# Patient Record
Sex: Female | Born: 1960 | Race: Black or African American | Hispanic: No | Smoking: Never smoker
Health system: Southern US, Community
[De-identification: ages and names within clinical notes are randomized; demographics above are authoritative.]

## PROBLEM LIST (undated history)

## (undated) DIAGNOSIS — K219 Gastro-esophageal reflux disease without esophagitis: Secondary | ICD-10-CM

## (undated) DIAGNOSIS — D509 Iron deficiency anemia, unspecified: Secondary | ICD-10-CM

## (undated) DIAGNOSIS — M069 Rheumatoid arthritis, unspecified: Secondary | ICD-10-CM

## (undated) DIAGNOSIS — K7689 Other specified diseases of liver: Secondary | ICD-10-CM

## (undated) DIAGNOSIS — A419 Sepsis, unspecified organism: Secondary | ICD-10-CM

## (undated) DIAGNOSIS — C801 Malignant (primary) neoplasm, unspecified: Secondary | ICD-10-CM

## (undated) DIAGNOSIS — F32A Depression, unspecified: Secondary | ICD-10-CM

## (undated) DIAGNOSIS — N184 Chronic kidney disease, stage 4 (severe): Secondary | ICD-10-CM

## (undated) HISTORY — PX: TUBAL LIGATION: SHX77

---

## 2008-01-12 DIAGNOSIS — A048 Other specified bacterial intestinal infections: Secondary | ICD-10-CM

## 2008-01-12 HISTORY — DX: Other specified bacterial intestinal infections: A04.8

## 2008-09-08 DIAGNOSIS — K265 Chronic or unspecified duodenal ulcer with perforation: Secondary | ICD-10-CM

## 2008-09-08 HISTORY — DX: Chronic or unspecified duodenal ulcer with perforation: K26.5

## 2008-09-08 HISTORY — PX: PYLOROPLASTY: SHX418

## 2014-07-19 DIAGNOSIS — K8301 Primary sclerosing cholangitis: Secondary | ICD-10-CM

## 2014-07-19 HISTORY — DX: Primary sclerosing cholangitis: K83.01

## 2016-01-12 DIAGNOSIS — E7800 Pure hypercholesterolemia, unspecified: Secondary | ICD-10-CM

## 2016-01-12 HISTORY — DX: Pure hypercholesterolemia, unspecified: E78.00

## 2018-03-06 HISTORY — PX: TOTAL COLECTOMY: SHX852

## 2020-04-29 ENCOUNTER — Other Ambulatory Visit: Payer: Self-pay

## 2020-04-29 ENCOUNTER — Emergency Department: Payer: Medicare Other

## 2020-04-29 ENCOUNTER — Inpatient Hospital Stay
Admission: EM | Admit: 2020-04-29 | Discharge: 2020-05-02 | DRG: 389 | Disposition: A | Discharge status: Home / Self Care | Payer: Medicare Other | Attending: Internal Medicine | Admitting: Internal Medicine

## 2020-04-29 DIAGNOSIS — R194 Change in bowel habit: Secondary | ICD-10-CM | POA: Diagnosis not present

## 2020-04-29 DIAGNOSIS — Z8509 Personal history of malignant neoplasm of other digestive organs: Secondary | ICD-10-CM | POA: Diagnosis not present

## 2020-04-29 DIAGNOSIS — K7460 Unspecified cirrhosis of liver: Secondary | ICD-10-CM | POA: Diagnosis present

## 2020-04-29 DIAGNOSIS — R109 Unspecified abdominal pain: Secondary | ICD-10-CM

## 2020-04-29 DIAGNOSIS — N184 Chronic kidney disease, stage 4 (severe): Secondary | ICD-10-CM

## 2020-04-29 DIAGNOSIS — I10 Essential (primary) hypertension: Secondary | ICD-10-CM | POA: Diagnosis not present

## 2020-04-29 DIAGNOSIS — Z888 Allergy status to other drugs, medicaments and biological substances status: Secondary | ICD-10-CM | POA: Diagnosis not present

## 2020-04-29 DIAGNOSIS — C786 Secondary malignant neoplasm of retroperitoneum and peritoneum: Secondary | ICD-10-CM

## 2020-04-29 DIAGNOSIS — Z9221 Personal history of antineoplastic chemotherapy: Secondary | ICD-10-CM

## 2020-04-29 DIAGNOSIS — K8301 Primary sclerosing cholangitis: Secondary | ICD-10-CM

## 2020-04-29 DIAGNOSIS — Z85038 Personal history of other malignant neoplasm of large intestine: Secondary | ICD-10-CM | POA: Diagnosis not present

## 2020-04-29 DIAGNOSIS — Z8711 Personal history of peptic ulcer disease: Secondary | ICD-10-CM

## 2020-04-29 DIAGNOSIS — E7800 Pure hypercholesterolemia, unspecified: Secondary | ICD-10-CM | POA: Diagnosis present

## 2020-04-29 DIAGNOSIS — K219 Gastro-esophageal reflux disease without esophagitis: Secondary | ICD-10-CM | POA: Diagnosis present

## 2020-04-29 DIAGNOSIS — K565 Intestinal adhesions [bands], unspecified as to partial versus complete obstruction: Principal | ICD-10-CM | POA: Diagnosis present

## 2020-04-29 DIAGNOSIS — R933 Abnormal findings on diagnostic imaging of other parts of digestive tract: Secondary | ICD-10-CM | POA: Diagnosis not present

## 2020-04-29 DIAGNOSIS — N1831 Chronic kidney disease, stage 3a: Secondary | ICD-10-CM | POA: Diagnosis not present

## 2020-04-29 DIAGNOSIS — M069 Rheumatoid arthritis, unspecified: Secondary | ICD-10-CM | POA: Diagnosis present

## 2020-04-29 DIAGNOSIS — I129 Hypertensive chronic kidney disease with stage 1 through stage 4 chronic kidney disease, or unspecified chronic kidney disease: Secondary | ICD-10-CM | POA: Diagnosis present

## 2020-04-29 DIAGNOSIS — R188 Other ascites: Secondary | ICD-10-CM | POA: Diagnosis present

## 2020-04-29 DIAGNOSIS — K56609 Unspecified intestinal obstruction, unspecified as to partial versus complete obstruction: Secondary | ICD-10-CM | POA: Diagnosis not present

## 2020-04-29 DIAGNOSIS — N183 Chronic kidney disease, stage 3 unspecified: Secondary | ICD-10-CM

## 2020-04-29 DIAGNOSIS — R140 Abdominal distension (gaseous): Secondary | ICD-10-CM

## 2020-04-29 DIAGNOSIS — Z932 Ileostomy status: Secondary | ICD-10-CM | POA: Diagnosis not present

## 2020-04-29 DIAGNOSIS — Z886 Allergy status to analgesic agent status: Secondary | ICD-10-CM

## 2020-04-29 DIAGNOSIS — Z9049 Acquired absence of other specified parts of digestive tract: Secondary | ICD-10-CM

## 2020-04-29 DIAGNOSIS — Z880 Allergy status to penicillin: Secondary | ICD-10-CM

## 2020-04-29 DIAGNOSIS — Z91018 Allergy to other foods: Secondary | ICD-10-CM | POA: Diagnosis not present

## 2020-04-29 DIAGNOSIS — K754 Autoimmune hepatitis: Secondary | ICD-10-CM

## 2020-04-29 DIAGNOSIS — R1110 Vomiting, unspecified: Secondary | ICD-10-CM | POA: Diagnosis not present

## 2020-04-29 DIAGNOSIS — R970 Elevated carcinoembryonic antigen [CEA]: Secondary | ICD-10-CM | POA: Diagnosis not present

## 2020-04-29 DIAGNOSIS — Z20822 Contact with and (suspected) exposure to covid-19: Secondary | ICD-10-CM | POA: Diagnosis present

## 2020-04-29 HISTORY — DX: Depression, unspecified: F32.A

## 2020-04-29 HISTORY — DX: Malignant (primary) neoplasm, unspecified: C80.1

## 2020-04-29 HISTORY — DX: Gastro-esophageal reflux disease without esophagitis: K21.9

## 2020-04-29 HISTORY — DX: Chronic kidney disease, stage 4 (severe): N18.4

## 2020-04-29 HISTORY — DX: Iron deficiency anemia, unspecified: D50.9

## 2020-04-29 HISTORY — DX: Other specified diseases of liver: K76.89

## 2020-04-29 HISTORY — DX: Rheumatoid arthritis, unspecified: M06.9

## 2020-04-29 LAB — LIPASE, BLOOD: Lipase: 59 U/L — ABNORMAL HIGH (ref 11–51)

## 2020-04-29 LAB — CBC WITH DIFFERENTIAL/PLATELET
Abs Immature Granulocytes: 0.05 10*3/uL (ref 0.00–0.07)
Basophils Absolute: 0 10*3/uL (ref 0.0–0.1)
Basophils Relative: 0 %
Eosinophils Absolute: 0 10*3/uL (ref 0.0–0.5)
Eosinophils Relative: 0 %
HCT: 36 % (ref 36.0–46.0)
Hemoglobin: 11.6 g/dL — ABNORMAL LOW (ref 12.0–15.0)
Immature Granulocytes: 0 %
Lymphocytes Relative: 5 %
Lymphs Abs: 0.6 10*3/uL — ABNORMAL LOW (ref 0.7–4.0)
MCH: 28.5 pg (ref 26.0–34.0)
MCHC: 32.2 g/dL (ref 30.0–36.0)
MCV: 88.5 fL (ref 80.0–100.0)
Monocytes Absolute: 0.8 10*3/uL (ref 0.1–1.0)
Monocytes Relative: 6 %
Neutro Abs: 11.9 10*3/uL — ABNORMAL HIGH (ref 1.7–7.7)
Neutrophils Relative %: 89 %
Platelets: 162 10*3/uL (ref 150–400)
RBC: 4.07 MIL/uL (ref 3.87–5.11)
RDW: 15.9 % — ABNORMAL HIGH (ref 11.5–15.5)
WBC: 13.4 10*3/uL — ABNORMAL HIGH (ref 4.0–10.5)
nRBC: 0 % (ref 0.0–0.2)

## 2020-04-29 LAB — RESP PANEL BY RT-PCR (FLU A&B, COVID) ARPGX2
Influenza A by PCR: NEGATIVE
Influenza B by PCR: NEGATIVE
SARS Coronavirus 2 by RT PCR: NEGATIVE

## 2020-04-29 LAB — URINALYSIS, COMPLETE (UACMP) WITH MICROSCOPIC
Bilirubin Urine: NEGATIVE
Glucose, UA: NEGATIVE mg/dL
Hgb urine dipstick: NEGATIVE
Ketones, ur: NEGATIVE mg/dL
Leukocytes,Ua: NEGATIVE
Nitrite: NEGATIVE
Protein, ur: NEGATIVE mg/dL
Specific Gravity, Urine: 1.011 (ref 1.005–1.030)
pH: 5 (ref 5.0–8.0)

## 2020-04-29 LAB — COMPREHENSIVE METABOLIC PANEL
ALT: 39 U/L (ref 0–44)
AST: 27 U/L (ref 15–41)
Albumin: 3.6 g/dL (ref 3.5–5.0)
Alkaline Phosphatase: 245 U/L — ABNORMAL HIGH (ref 38–126)
Anion gap: 10 (ref 5–15)
BUN: 34 mg/dL — ABNORMAL HIGH (ref 8–23)
CO2: 16 mmol/L — ABNORMAL LOW (ref 22–32)
Calcium: 9.3 mg/dL (ref 8.9–10.3)
Chloride: 108 mmol/L (ref 98–111)
Creatinine, Ser: 1.74 mg/dL — ABNORMAL HIGH (ref 0.44–1.00)
GFR, Estimated: 33 mL/min — ABNORMAL LOW (ref >60)
Glucose, Bld: 112 mg/dL — ABNORMAL HIGH (ref 70–99)
Potassium: 4.1 mmol/L (ref 3.5–5.1)
Sodium: 134 mmol/L — ABNORMAL LOW (ref 135–145)
Total Bilirubin: 1.2 mg/dL (ref 0.3–1.2)
Total Protein: 6.9 g/dL (ref 6.5–8.1)

## 2020-04-29 MED ORDER — IOHEXOL 9 MG/ML PO SOLN: 500.0000 mL | ORAL | Status: AC

## 2020-04-29 MED ORDER — ONDANSETRON HCL 4 MG/2ML IJ SOLN
4.0000 mg | Freq: Four times a day (QID) | INTRAMUSCULAR | Status: DC | PRN
  Administered 2020-04-29: 4 mg via INTRAVENOUS
  Filled 2020-04-29: qty 2

## 2020-04-29 MED ORDER — PHENOL 1.4 % MT LIQD
1.0000 | OROMUCOSAL | Status: DC | PRN
  Filled 2020-04-29: qty 177

## 2020-04-29 MED ORDER — MENTHOL 3 MG MT LOZG
1.0000 | LOZENGE | OROMUCOSAL | Status: DC | PRN
  Filled 2020-04-29: qty 9

## 2020-04-29 MED ORDER — ENOXAPARIN SODIUM 40 MG/0.4ML ~~LOC~~ SOLN
40.0000 mg | SUBCUTANEOUS | Status: DC
  Filled 2020-04-29: qty 0.4

## 2020-04-29 MED ORDER — MORPHINE SULFATE (PF) 2 MG/ML IV SOLN
2.0000 mg | INTRAVENOUS | Status: DC | PRN
  Administered 2020-04-29 – 2020-04-30 (×6): 2 mg via INTRAVENOUS
  Filled 2020-04-29 (×6): qty 1

## 2020-04-29 MED ORDER — LACTATED RINGERS IV BOLUS
1000.0000 mL | Freq: Once | INTRAVENOUS | Status: AC
  Administered 2020-04-29: 1000 mL via INTRAVENOUS

## 2020-04-29 MED ORDER — HYDRALAZINE HCL 20 MG/ML IJ SOLN
10.0000 mg | Freq: Four times a day (QID) | INTRAMUSCULAR | Status: DC | PRN
  Filled 2020-04-29: qty 1

## 2020-04-29 MED ORDER — ONDANSETRON HCL 4 MG/2ML IJ SOLN
4.0000 mg | Freq: Once | INTRAMUSCULAR | Status: AC
  Administered 2020-04-29: 4 mg via INTRAVENOUS
  Filled 2020-04-29: qty 2

## 2020-04-29 MED ORDER — MORPHINE SULFATE (PF) 4 MG/ML IV SOLN
4.0000 mg | Freq: Once | INTRAVENOUS | Status: AC
Start: 2020-04-29 — End: 2020-04-29
  Administered 2020-04-29: 4 mg via INTRAVENOUS
  Filled 2020-04-29: qty 1

## 2020-04-29 MED ORDER — ONDANSETRON HCL 4 MG PO TABS: 4.0000 mg | ORAL_TABLET | Freq: Four times a day (QID) | ORAL | Status: DC | PRN

## 2020-04-29 MED ORDER — PANTOPRAZOLE SODIUM 40 MG IV SOLR
40.0000 mg | INTRAVENOUS | Status: DC
  Administered 2020-04-29 – 2020-04-30 (×2): 40 mg via INTRAVENOUS
  Filled 2020-04-29 (×2): qty 40

## 2020-04-29 MED ORDER — POTASSIUM CHLORIDE IN NACL 20-0.9 MEQ/L-% IV SOLN
INTRAVENOUS | Status: DC
  Filled 2020-04-29 (×4): qty 1000

## 2020-04-29 NOTE — ED Notes (Signed)
Pt to room 211. Two floor RN's notified pt in room and sitting on bedside commode as requested by pt. Retail banker to remind floor RN pt's NG tube needs to be put back on low suction once pt back in bed. Secretary stated she'll let the RN know.

## 2020-04-29 NOTE — Consult Note (Signed)
Hematology/Oncology Consult note [facility] Telephone:([phone] [phone]  Patient Care Team: Pcp, No as PCP - General   Name of the patient: Tanya Osborne  [id number]  1960/04/14    Reason for consult: History of colon cancer now presenting with bowel obstruction Requesting physician: Dr. Francine Graven  Date of visit: 04/29/2020    History of presenting illness-patient is a 60 year old female with multiple comorbidities including Autoimmune liver disease, primary sclerosing cholangitis, pyoderma gangrenosum, renal insufficiency, duodenal ulcer perforation in 2010 and most recently high risk stage III colon cancer diagnosed in February 2020.  She received 12 cycles of FOLFOX chemotherapy ending in October 2020.  Last seen by [facility] medical oncology Dr. Dayna Ramus tube on 04/09/2020.  At that time she has had a CT scan which showed some concern for possible omental disease.  However it was unclear of those findings were secondary to prior surgery and plan was restaging scans every 6 months with a repeat CEA to be checked in 6 weeks which was trending up.  Patient now presents to [facility] ER with symptoms of abdominal pain nausea and vomiting.  She underwent CT abdomen and pelvis without contrast which showed small bowel obstruction pattern with dilatation of proximal and mid small bowel loops and decompressed distal small bowel with transition point likely in the upper left pelvis.  Omental/peritoneal thickening and nodularity concerning for metastatic disease.  Cirrhosis with small volume abdominal ascites.  Surgery is currently on board with plans for conservative management with IV fluids and n.p.o. and NG tube decompression.  If it does not resolve with conservative measures surgery could be considered but patient would prefer to go to [facility] for this.  ECOG PS- 1  Pain scale- 3   Review of systems- Review of Systems  Constitutional: Positive for malaise/fatigue.  Negative for chills, fever and weight loss.  HENT: Negative for congestion, ear discharge and nosebleeds.   Eyes: Negative for blurred vision.  Respiratory: Negative for cough, hemoptysis, sputum production, shortness of breath and wheezing.   Cardiovascular: Negative for chest pain, palpitations, orthopnea and claudication.  Gastrointestinal: Positive for nausea. Negative for abdominal pain, blood in stool, constipation, diarrhea, heartburn, melena and vomiting.  Genitourinary: Negative for dysuria, flank pain, frequency, hematuria and urgency.  Musculoskeletal: Negative for back pain, joint pain and myalgias.  Skin: Negative for rash.  Neurological: Negative for dizziness, tingling, focal weakness, seizures, weakness and headaches.  Endo/Heme/Allergies: Does not bruise/bleed easily.  Psychiatric/Behavioral: Negative for depression and suicidal ideas. The patient does not have insomnia.     Allergies  Allergen Reactions  . Nsaids Swelling and Rash    Other Reaction: OTHER REACTION-GI BLEED  . Penicillins Swelling  . Celebrex [Celecoxib] Swelling  . Cortisone Swelling  . Gabapentin Swelling  . Methotrexate Derivatives Swelling  . Nabumetone Other (See Comments)  . Tomato Rash    Joint inflamation    Patient Active Problem List   Diagnosis Date Noted  . SBO (small bowel obstruction) ([place]) 04/29/2020  . RA (rheumatoid arthritis) ([place])   . Essential hypertension   . CKD (chronic kidney disease) stage 3, GFR 30-59 ml/min (HCC)   . History of colon cancer      Past Medical History:  Diagnosis Date  . Autoimmune liver disease   . Cancer ([place])   . Depression   . Duodenal ulcer perforation ([place]) 09/08/2008  . GERD (gastroesophageal reflux disease)   . H. pylori infection 2010  . Hypercholesterolemia 2018  . Iron deficiency anemia   .  Kidney disease, chronic, stage IV (GFR 15-29 ml/min) (HCC)   . 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 RA (rheumatoid arthritis) ([place])       Past Surgical History:  Procedure Laterality Date  . PYLOROPLASTY  09/08/2008   with omental patch  . TOTAL COLECTOMY  03/06/2018   total abdominal colectomy with end ileostomy  . TUBAL LIGATION      Social History   Socioeconomic History  . Marital status: Divorced    Spouse name: Not on file  . Number of children: Not on file  . Years of education: Not on file  . Highest education level: Not on file  Occupational History  . Not on file  Tobacco Use  . Smoking status: Not on file  . Smokeless tobacco: Not on file  Substance and Sexual Activity  . Alcohol use: Not on file  . Drug use: Not on file  . Sexual activity: Not on file  Other Topics Concern  . Not on file  Social History Narrative  . Not on file   Social Determinants of Health   Financial Resource Strain: Not on file  Food Insecurity: Not on file  Transportation Needs: Not on file  Physical Activity: Not on file  Stress: Not on file  Social Connections: Not on file  Intimate Partner Violence: Not on file     Family History  Family history unknown: Yes     Current Facility-Administered Medications:  .  0.9 % NaCl with KCl 20 mEq/ L  infusion, , Intravenous, Continuous, Agbata, Tochukwu, MD, Last Rate: 100 mL/hr at 04/29/20 1455, New Bag at 04/29/20 1455 .  enoxaparin (LOVENOX) injection 40 mg, 40 mg, Subcutaneous, Q24H, Agbata, Tochukwu, MD .  hydrALAZINE (APRESOLINE) injection 10 mg, 10 mg, Intravenous, Q6H PRN, Agbata, Tochukwu, MD .  menthol-cetylpyridinium (CEPACOL) lozenge 3 mg, 1 lozenge, Oral, PRN, Edison Simon R, PA-C .  morphine 2 MG/ML injection 2 mg, 2 mg, Intravenous, Q4H PRN, Agbata, Tochukwu, MD, 2 mg at 04/29/20 1554 .  ondansetron (ZOFRAN) tablet 4 mg, 4 mg, Oral, Q6H PRN **OR** ondansetron (ZOFRAN) injection 4 mg, 4 mg, Intravenous, Q6H PRN, Agbata, Tochukwu, MD .  pantoprazole (PROTONIX) injection 40 mg, 40 mg, Intravenous, Q24H, Agbata, Tochukwu, MD, 40 mg at 04/29/20 1452 .   phenol (CHLORASEPTIC) mouth spray 1 spray, 1 spray, Mouth/Throat, PRN, Tylene Fantasia, PA-C  Current Outpatient Medications:  .  Cholecalciferol 50 MCG (2000 UT) CAPS, Take 2,000 Units by mouth daily., Disp: , Rfl:  .  clobetasol ointment (TEMOVATE) 2.13 %, Apply 1 application topically 2 (two) times daily., Disp: , Rfl:  .  ferrous sulfate 325 (65 FE) MG tablet, Take 325 mg by mouth daily with breakfast., Disp: , Rfl:  .  lidocaine-prilocaine (EMLA) cream, Apply 1 application topically once., Disp: , Rfl:  .  Multiple Vitamin (MULTI-VITAMIN) tablet, Take 1 tablet by mouth daily., Disp: , Rfl:  .  pantoprazole (PROTONIX) 20 MG tablet, Take 20 mg by mouth daily., Disp: , Rfl:  .  predniSONE (DELTASONE) 5 MG tablet, Take 5 mg by mouth daily with breakfast., Disp: , Rfl:    Physical exam:  Vitals:   04/29/20 0954 04/29/20 0955 04/29/20 1100 04/29/20 1515  BP: (!) 170/82  (!) 181/100 (!) 157/84  Pulse: 77  77 79  Resp: 17  18 17   Temp: 97.9 F (36.6 C)     TempSrc: Oral     SpO2: 100%  100% 100%  Weight:  172 lb (78 kg)  Height:  5\' 8"  (1.727 m)     Physical Exam Constitutional:      Comments: NG to low wall suction  Cardiovascular:     Rate and Rhythm: Normal rate and regular rhythm.     Heart sounds: Normal heart sounds.  Pulmonary:     Effort: Pulmonary effort is normal.     Breath sounds: Normal breath sounds.  Abdominal:     Palpations: Abdomen is soft.     Comments: Ileostomy in place  Musculoskeletal:     Cervical back: Normal range of motion.  Skin:    General: Skin is warm and dry.  Neurological:     Mental Status: She is alert and oriented to person, place, and time.        CMP Latest Ref Rng & Units 04/29/2020  Glucose 70 - 99 mg/dL 112(H)  BUN 8 - 23 mg/dL 34(H)  Creatinine 0.44 - 1.00 mg/dL 1.74(H)  Sodium 135 - 145 mmol/L 134(L)  Potassium 3.5 - 5.1 mmol/L 4.1  Chloride 98 - 111 mmol/L 108  CO2 22 - 32 mmol/L 16(L)  Calcium 8.9 - 10.3 mg/dL 9.3   Total Protein 6.5 - 8.1 g/dL 6.9  Total Bilirubin 0.3 - 1.2 mg/dL 1.2  Alkaline Phos 38 - 126 U/L 245(H)  AST 15 - 41 U/L 27  ALT 0 - 44 U/L 39   CBC Latest Ref Rng & Units 04/29/2020  WBC 4.0 - 10.5 K/uL 13.4(H)  Hemoglobin 12.0 - 15.0 g/dL 11.6(L)  Hematocrit 36.0 - 46.0 % 36.0  Platelets 150 - 400 K/uL 162    @IMAGES @  CT Abdomen Pelvis Wo Contrast  Result Date: 04/29/2020 CLINICAL DATA:  Elevated white blood cell count. Elevated creatinine. Vomiting. Bowel obstruction suspected. EXAM: CT ABDOMEN AND PELVIS WITHOUT CONTRAST TECHNIQUE: Multidetector CT imaging of the abdomen and pelvis was performed following the standard protocol without IV contrast. COMPARISON:  None FINDINGS: Lower chest: Clear lung bases. Normal heart size without pericardial or pleural effusion. Hepatobiliary: Cirrhosis, as evidenced by caudate lobe enlargement and an irregular hepatic capsule. No dominant liver mass, given limitation of noncontrast exam. Multiple gallstones without acute cholecystitis or biliary duct dilatation. Pancreas: Normal, without mass or ductal dilatation. Spleen: Normal in size, without focal abnormality. Adrenals/Urinary Tract: Normal adrenal glands. No renal calculi or hydronephrosis. No hydroureter or ureteric calculi. No bladder calculi. Stomach/Bowel: Normal stomach, without wall thickening. Hartmann's pouch. Right-sided ostomy.  At least partial colectomy. Small bowel loops are dilated and fluid-filled, including at 3.2 cm on 27/2. Distal to this, solid stool like material within small bowel loops including on 39/2. The distal bowel is decompressed. A focal transition point is not confidently identified. Likely within mid to distal small bowel of the left upper pelvis. No pneumatosis or free intraperitoneal air. Vascular/Lymphatic: Aortic atherosclerosis. No abdominopelvic adenopathy. Reproductive: Retroverted uterus with calcified 2.6 cm fibroid within. No adnexal mass. Other: Mild pelvic  floor laxity. No significant free fluid. Small volume perihepatic ascites. Extensive omental thickening and nodularity, including on 38/2. Musculoskeletal: Degenerative disc disease at the lumbosacral junction. Disc bulges at L3-4 and L4-5. IMPRESSION: 1. Mild degradation secondary to lack of oral or IV contrast. 2. Right-sided ostomy and Hartmann's pouch with at least partial colectomy. 3. Small bowel obstruction pattern with dilatation of proximal and mid small bowel loops and decompressed distal small bowel. Transition likely is within the upper left pelvis, but difficult to delineate due to lack of contrast. 4. Omental/peritoneal thickening and nodularity, highly suspicious for metastatic  disease. Presumably from the patient's colonic primary. 5. Cirrhosis with small volume abdominal ascites. 6. Cholelithiasis. 7.  Aortic Atherosclerosis (ICD10-I70.0). 8. Uterine fibroid. Electronically Signed   By: Abigail Miyamoto M.D.   On: 04/29/2020 12:08    Assessment and plan- Patient is a 60 y.o. female with history of stage III colon cancer in February 2020 s/p 12 cycles of FOLFOX chemotherapy given adjuvantly up until October 2020.  Patient's care has been at [facility].  She has other complex medical history including autoimmune liver disease, primary sclerosing cholangitis, inflammatory arthritis, CKD and abdominal surgeries in the past now presenting with small bowel obstruction  Small bowel obstruction: Could be in the setting of adhesions from prior surgeries versus malignant bowel obstruction.  Surgery is on board and she is being treated with conservative measures including decompression NG tube IV fluids and nausea medications.  If bowel obstruction fails to improve with conservative measures surgery would be a consideration given her complex surgical history and prior history of colonic strictures patient prefers to be transferred to [facility] for this.  CT scan also shows concern for omental metastatic disease which  was already being considered when she was seen by her primary oncologist Dr. Dennison Nancy in March 2022.  I will defer further management to [place] oncology as it would not change her present management which is presently geared towards improving her bowel obstruction.     Visit Diagnosis 1. SBO (small bowel obstruction) ([place])   2. History of colon cancer   3. Small bowel obstruction (HCC)     Dr. Randa Evens, MD, MPH [facility] [phone] 04/29/2020

## 2020-04-29 NOTE — ED Notes (Signed)
Pt presents to ED via POV with c/o of lower ABD pain, pt states "I think I ate some bad food this weekend". Pt states vomiting all day Monday and states she now has a "sore" stomach. Pt denies active nausea or vomiting. NAD noted.

## 2020-04-29 NOTE — Progress Notes (Signed)
   04/29/20 1100  Clinical Encounter Type  Visited With Patient and family together  Visit Type Initial;Spiritual support;Social support  Referral From Chaplain  Consult/Referral To [place] visited with PT and her daughter while doing rounds. PT was able to express her deep faith in God. She stated, no matter what she will be good. She spoke to the chaplain about her various emotions, her faith, and her love for her church. Chaplain ministered with presence, reflective listening, and prayer.

## 2020-04-29 NOTE — Consult Note (Addendum)
Patient seen and evaluated with Mr. Olean Ree and agree with his note.  Patient presents with two day history of worsening/progressing nausea, vomiting, with mid abdominal discomfort/pain.  She has a recent history of colon cancer s/p total colectomy with end ileostomy in 2020 at [facility].  She had a recent MRI which shows some peritoneal/omental enhancing concerning for possible metastatic disease and was scheduled for flex sig at [facility] tomorrow but she canceled the appointment due to her current illness.  Her most recent CEA is elevated to 40.  In the ED, workup showed small bowel obstruction, also with some thickening in the omentum and peritoneum.  NG tube was placed at bedside.  Discussed with patient the role for conservative management with NG tube decompression to allow for the SBO to resolve.  However, if there's no improvement over the next 72 hrs, may require surgical exploration.  Given her prior surgical and current potential metastatic disease, this would be a complex scenario.  She would rather be transferred to [facility] if any surgery is needed.  For now, continue NG tube, NPO, IV fluids, KUB in AM.  Olean Ree, MD   [facility] SURGICAL CONSULTATION NOTE (initial) - cpt: [phone]   HISTORY OF PRESENT ILLNESS (HPI):  60 y.o. female presented to [facility] ED today for evaluation of abdominal pain. Patient reports around a 48 hour history of worsening generalized abdominal pain along with associated distension, nausea, emesis. She does have a history of total colectomy and ileostomy in February 2020 (Dr Maxie Better Rob Hickman) secondary to colon CA, and she has noticed decreased output from her ileostomy. She has not been able to tolerate any PO intake over this time as well. She denied any fever, chills, cough, CP,. SOB, or urinary changes. Additional intraabdominal surgical history is positive for duodenal perforation repair in 2010. She has completed chemotherapy >1 year ago for her colon cancer and is  currently under surveillance. However, she did have an MRI in March of 2022 which was concerning for changes in the peritoneum and omentum concerning for advancement of her diease. Her most recent CEA was 40 on 03/30. She was due for screening flexible sigmoidoscopy tomorrow (04/20), but cancel due to her illness. Work up in the ED was concerning for mild leukocytosis to 13.4K, baseline CKD with sCr - 1.74, mild hyponatremia to 134, lipase and alk phos were also mildly elevated, and CT Abdomen/Pelvis was concerning for small bowel obstruction. She was offered transfer to [facility] but prefers to remain here unless surgical intervention become necessary.   Surgery is consulted by emergency medicine physician Dr. Blake Divine, MD in this context for evaluation and management of SBO.   PAST MEDICAL HISTORY (PMH):  Past Medical History:  Diagnosis Date  . Autoimmune liver disease   . Cancer ([place])   . Depression   . Duodenal ulcer perforation ([place]) 09/08/2008  . GERD (gastroesophageal reflux disease)   . H. pylori infection 2010  . Hypercholesterolemia 2018  . Iron deficiency anemia   . Kidney disease, chronic, stage IV (GFR 15-29 ml/min) (HCC)   . 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 RA (rheumatoid arthritis) ([place])      PAST SURGICAL HISTORY ([place]):  Past Surgical History:  Procedure Laterality Date  . PYLOROPLASTY  09/08/2008   with omental patch  . TOTAL COLECTOMY  03/06/2018   total abdominal colectomy with end ileostomy  . TUBAL LIGATION       MEDICATIONS:  Prior to Admission medications   Not on  File     ALLERGIES:  Allergies  Allergen Reactions  . Nsaids Swelling and Rash    Other Reaction: OTHER REACTION-GI BLEED  . Penicillins Swelling  . Celebrex [Celecoxib] Swelling  . Cortisone Swelling  . Gabapentin Swelling  . Methotrexate Derivatives Swelling  . Nabumetone Other (See Comments)  . Tomato Rash    Joint inflamation     SOCIAL HISTORY:  Social History    Socioeconomic History  . Marital status: Divorced    Spouse name: Not on file  . Number of children: Not on file  . Years of education: Not on file  . Highest education level: Not on file  Occupational History  . Not on file  Tobacco Use  . Smoking status: Not on file  . Smokeless tobacco: Not on file  Substance and Sexual Activity  . Alcohol use: Not on file  . Drug use: Not on file  . Sexual activity: Not on file  Other Topics Concern  . Not on file  Social History Narrative  . Not on file   Social Determinants of Health   Financial Resource Strain: Not on file  Food Insecurity: Not on file  Transportation Needs: Not on file  Physical Activity: Not on file  Stress: Not on file  Social Connections: Not on file  Intimate Partner Violence: Not on file     FAMILY HISTORY:  Family History  Family history unknown: Yes      REVIEW OF SYSTEMS:  Review of Systems  Constitutional: Negative for chills and fever.  HENT: Negative for congestion and sore throat.   Respiratory: Negative for cough and shortness of breath.   Cardiovascular: Negative for chest pain and palpitations.  Gastrointestinal: Positive for abdominal pain, nausea and vomiting.  Genitourinary: Negative for dysuria and urgency.  All other systems reviewed and are negative.   VITAL SIGNS:  Temp:  [97.9 F (36.6 C)] 97.9 F (36.6 C) (04/19 0954) Pulse Rate:  [77-79] 79 (04/19 1515) Resp:  [17-18] 17 (04/19 1515) BP: (157-181)/(82-100) 157/84 (04/19 1515) SpO2:  [100 %] 100 % (04/19 1515) Weight:  [78 kg] 78 kg (04/19 0955)     Height: '5\' 8"'  (172.7 cm) Weight: 78 kg BMI (Calculated): 26.16   INTAKE/OUTPUT:  No intake/output data recorded.  PHYSICAL EXAM:  Physical Exam Vitals and nursing note reviewed. Exam conducted with a chaperone present.  Constitutional:      General: She is not in acute distress.    Appearance: She is well-developed. She is not ill-appearing.     Comments: Patient  sitting up in bed, family at bedside  HENT:     Head: Normocephalic and atraumatic.     Mouth/Throat:     Comments: NGT in place, output appears somewhat feculent  Cardiovascular:     Rate and Rhythm: Normal rate.     Heart sounds: Normal heart sounds. No murmur heard.   Pulmonary:     Effort: Pulmonary effort is normal. No respiratory distress.  Abdominal:     General: The ostomy site is clean. There is distension.     Palpations: Abdomen is soft.     Tenderness: There is no abdominal tenderness. There is no guarding or rebound.     Comments: Abdomen is soft, mildly distended, generalized soreness, ileostomy in the right mid abdomen, patent, minimal stool in bag  Genitourinary:    Comments: Deferred Skin:    General: Skin is warm and dry.     Coloration: Skin is not jaundiced or  pale.  Neurological:     General: No focal deficit present.     Mental Status: She is alert and oriented to person, place, and time.  Psychiatric:        Mood and Affect: Mood normal.        Behavior: Behavior normal.      Labs:  CBC Latest Ref Rng & Units 04/29/2020  WBC 4.0 - 10.5 K/uL 13.4(H)  Hemoglobin 12.0 - 15.0 g/dL 11.6(L)  Hematocrit 36.0 - 46.0 % 36.0  Platelets 150 - 400 K/uL 162   CMP Latest Ref Rng & Units 04/29/2020  Glucose 70 - 99 mg/dL 112(H)  BUN 8 - 23 mg/dL 34(H)  Creatinine 0.44 - 1.00 mg/dL 1.74(H)  Sodium 135 - 145 mmol/L 134(L)  Potassium 3.5 - 5.1 mmol/L 4.1  Chloride 98 - 111 mmol/L 108  CO2 22 - 32 mmol/L 16(L)  Calcium 8.9 - 10.3 mg/dL 9.3  Total Protein 6.5 - 8.1 g/dL 6.9  Total Bilirubin 0.3 - 1.2 mg/dL 1.2  Alkaline Phos 38 - 126 U/L 245(H)  AST 15 - 41 U/L 27  ALT 0 - 44 U/L 39     Imaging studies:   CT Abdomen/Pelvis (04/29/2020) personally reviewed with dilated loops of bowel concerning for obstruction vs ileus, s/p colectomy with ileostomy, and some mesenteric changes seen, and radiologist report reviewed below:  IMPRESSION: 1. Mild degradation  secondary to lack of oral or IV contrast. 2. Right-sided ostomy and Hartmann's pouch with at least partial colectomy. 3. Small bowel obstruction pattern with dilatation of proximal and mid small bowel loops and decompressed distal small bowel. Transition likely is within the upper left pelvis, but difficult to delineate due to lack of contrast. 4. Omental/peritoneal thickening and nodularity, highly suspicious for metastatic disease. Presumably from the patient's colonic primary. 5. Cirrhosis with small volume abdominal ascites. 6. Cholelithiasis. 7.  Aortic Atherosclerosis (ICD10-I70.0). 8. Uterine fibroid.    Assessment/Plan: (ICD-10's: K69.609) 60 y.o. female with abdominal pain, distension, nausea, emesis concerning for possible SBO secondary to post-surgical adhesions; however, there is a high concern that this represent advancement of her disease given recent MRI findings and elevated CEA, complicated by pertinent comorbidities including history of colon CA s/p colectomy, ileostomy, and chemotherapy.   - Appreciate medicine admission  - Agree with plan for NGT decompression; LIS; monitor and record output  - NPO + IVF Resuscitation  - Monitor abdominal examination; on-going bowel function  - Pain control prn; antiemetics prn  - Serial KUBs as needed; may consider gastrografin challenge in next 448 hours  - No emergent surgical intervention. Patient understands that should she fail to resolve with conservative measures, surgery would need to be considered. She would prefer transfer to [place] should this need arise   - Monitor leukocytosis; renal function; electrolytes  - Mobilization as tolerated    - Further management per primary service; we will follow    All of the above findings and recommendations were discussed with the patient and her daughter's at bedside, and all of their questions were answered to their expressed satisfaction.  Thank you for the opportunity to  participate in this patient's care.   -- Olean Ree, PA-C [facility] 04/29/2020, 4:15 PM [phone] M-F: 7am - 4pm

## 2020-04-29 NOTE — ED Provider Notes (Signed)
[facility] Emergency Department Provider Note   ____________________________________________   Event Date/Time   First MD Initiated Contact with Patient 04/29/20 [id number]     (approximate)  I have reviewed the triage vital signs and the nursing notes.   HISTORY  Chief Complaint Abdominal Pain    HPI Tanya Osborne is a 60 y.o. 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  Patient reports she has had 2 days of worsening diffuse pain affecting her abdomen along with mild distention.  This is been associated with persistent nausea and vomiting, patient has had trouble keeping down either liquids or solids.  She does report that her ileostomy output is decreased, she has not noticed any blood in her stool.  She denies any fevers, cough, chest pain, or shortness of breath.  She completed chemotherapy for her colon cancer 1 year ago and follows with oncology as well as surgery at [facility].        Past Medical History:  Diagnosis Date  . Cancer ([place])   . RA (rheumatoid arthritis) [facility])     Patient Active Problem List   Diagnosis Date Noted  . SBO (small bowel obstruction) ([place]) 04/29/2020    Past Surgical History:  Procedure Laterality Date  . COLECTOMY      Prior to Admission medications   Not on File    Allergies Celebrex [celecoxib], Cortisone, Gabapentin, Methotrexate derivatives, Nabumetone, and Nsaids  No family history on file.  Social History    Review of Systems  Constitutional: No fever/chills Eyes: No visual changes. ENT: No sore throat. Cardiovascular: Denies chest pain. Respiratory: Denies shortness of breath. Gastrointestinal: Positive for abdominal pain, nausea, and vomiting.  No diarrhea.  No constipation. Genitourinary: Negative for  dysuria. Musculoskeletal: Negative for back pain. Skin: Negative for rash. Neurological: Negative for headaches, focal weakness or numbness.  ____________________________________________   PHYSICAL EXAM:  VITAL SIGNS: ED Triage Vitals  Enc Vitals Group     BP 04/29/20 0954 (!) 170/82     Pulse Rate 04/29/20 0954 77     Resp 04/29/20 0954 17     Temp 04/29/20 0954 97.9 F (36.6 C)     Temp Source 04/29/20 0954 Oral     SpO2 04/29/20 0954 100 %     Weight 04/29/20 0955 172 lb (78 kg)     Height 04/29/20 0955 5\' 8"  (1.727 m)     Head Circumference --      Peak Flow --      Pain Score 04/29/20 0954 10     Pain Loc --      Pain Edu? --      Excl. in [place]? --     Constitutional: Alert and oriented. Eyes: Conjunctivae are normal. Head: Atraumatic. Nose: No congestion/rhinnorhea. Mouth/Throat: Mucous membranes are moist. Neck: Normal ROM Cardiovascular: Normal rate, regular rhythm. Grossly normal heart sounds. Respiratory: Normal respiratory effort.  No retractions. Lungs CTAB. Gastrointestinal: Soft and diffusely tender to palpation with no rebound or guarding.  Mildly distended.  Ileostomy bag in place with minimal output. Genitourinary: deferred Musculoskeletal: No lower extremity tenderness nor edema. Neurologic:  Normal speech and language. No gross focal neurologic deficits are appreciated. Skin:  Skin is warm, dry and intact. No rash noted. Psychiatric: Mood and affect are normal. Speech and behavior are normal.  ____________________________________________   LABS (all labs  ordered are listed, but only abnormal results are displayed)  Labs Reviewed  CBC WITH DIFFERENTIAL/PLATELET - Abnormal; Notable for the following components:      Result Value   WBC 13.4 (*)    Hemoglobin 11.6 (*)    RDW 15.9 (*)    Neutro Abs 11.9 (*)    Lymphs Abs 0.6 (*)    All other components within normal limits  COMPREHENSIVE METABOLIC PANEL - Abnormal; Notable for the following  components:   Sodium 134 (*)    CO2 16 (*)    Glucose, Bld 112 (*)    BUN 34 (*)    Creatinine, Ser 1.74 (*)    Alkaline Phosphatase 245 (*)    GFR, Estimated 33 (*)    All other components within normal limits  LIPASE, BLOOD - Abnormal; Notable for the following components:   Lipase 59 (*)    All other components within normal limits  URINALYSIS, COMPLETE (UACMP) WITH MICROSCOPIC - Abnormal; Notable for the following components:   Color, Urine YELLOW (*)    APPearance HAZY (*)    Bacteria, UA FEW (*)    All other components within normal limits  RESP PANEL BY RT-PCR (FLU A&B, COVID) ARPGX2  HIV ANTIBODY (ROUTINE TESTING W REFLEX)    PROCEDURES  Procedure(s) performed (including Critical Care):  Procedures   ____________________________________________   INITIAL IMPRESSION / ASSESSMENT AND PLAN / ED COURSE       60 year old female with past medical history of 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  CT scan is concerning for small bowel obstruction with no clear transition point.  Case discussed with Dr. Hampton Abbot of general surgery, who reviewed images and agrees with plan for conservative management.  We will hydrate with IV fluids and place NG tube, pain and nausea are improved following Zofran and morphine.  Lab work is unremarkable, case discussed with hospitalist for admission.  Patient and family are agreeable to be admitted here at [facility] unless patient were to need surgical intervention, at which point she would be want to be transferred to [facility].      ____________________________________________   FINAL CLINICAL IMPRESSION(S) / ED DIAGNOSES  Final diagnoses:  SBO (small bowel obstruction) ([place])  History of colon cancer     ED Discharge Orders    None        Note:  This document was prepared using Dragon voice recognition software and may include unintentional dictation errors.   Blake Divine, MD 04/29/20 1326

## 2020-04-29 NOTE — H&P (Signed)
History and Physical    Tanya Osborne FVC:[id number] DOB: 28-Feb-1960 DOA: 04/29/2020  PCP: Pcp, No   Patient coming from: Home  I have personally briefly reviewed patient's old medical records in [place]  Chief Complaint: Abdominal pain  HPI: Tanya Osborne is a 60 y.o. 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  Pain is mostly in the periumbilical area and was rated 10 x 10 in intensity at its worst associated with nausea and vomiting.  Patient notes that her ileostomy output was decreased.  She initially thought her symptoms was related to food poisoning but due to its persistence she came to the ER for further evaluation. Patient completed her chemotherapy for colon cancer 1 year ago and follows up with oncology at [facility]. She denies having any fever, no chills, no dizziness, no lightheadedness, no urinary symptoms, no headache, no chest shortness of breath, no lower extremity swelling, no diaphoresis, no palpitations. Labs show sodium 134, potassium 4.1, chloride 108, bicarb 16, glucose 112, BUN 34, creatinine 1.74, calcium 9.3, alkaline phosphatase 245, albumin 3.6, lipase 59, AST 27, ALT 39, total protein 6.9, white count 13.4, hemoglobin 11.6, hematocrit 36, MCV 88.9, RDW 15.9 platelet count 162 CT scan of abdomen and pelvis shows Mild degradation secondary to lack of oral or IV contrast. Right-sided ostomy and Hartmann's pouch with at least partial Colectomy. Small bowel obstruction pattern with dilatation of proximal and mid small bowel loops and decompressed distal small bowel. Transition likely is within the upper left pelvis, but difficult to delineate due to lack of contrast. Omental/peritoneal thickening and nodularity, highly suspicious for metastatic disease. Presumably from the  patient's colonic primary. Cirrhosis with small volume abdominal ascites. Cholelithiasis. Aortic Atherosclerosis Uterine fibroid.    ED Course: Patient is a 60 year old female with a history of colon cancer status post total colectomy with ileostomy who presents to the ER for evaluation of abdominal pain associated with nausea and vomiting.  Imaging shows small bowel obstruction.  Patient will be admitted to the hospital for further evaluation and treatment.  Review of Systems: As per HPI otherwise all other systems reviewed and negative.    Past Medical History:  Diagnosis Date  . Cancer ([place])   . RA (rheumatoid arthritis) ([place])     Past Surgical History:  Procedure Laterality Date  . COLECTOMY    . ILEOSTOMY       has no history on file for tobacco use, alcohol use, and drug use.  Allergies  Allergen Reactions  . Nsaids Swelling and Rash    Other Reaction: OTHER REACTION-GI BLEED  . Penicillins Swelling  . Celebrex [Celecoxib] Swelling  . Cortisone Swelling  . Gabapentin Swelling  . Methotrexate Derivatives Swelling  . Nabumetone Other (See Comments)  . Tomato Rash    Joint inflamation    Family History  Family history unknown: Yes      Prior to Admission medications   Not on File    Physical Exam: Vitals:   04/29/20 0954 04/29/20 0955 04/29/20 1100  BP: (!) 170/82  (!) 181/100  Pulse: 77  77  Resp: 17  18  Temp: 97.9 F (36.6 C)    TempSrc: Oral    SpO2: 100%  100%  Weight:  78 kg   Height:  5\' 8"  (1.727 m)      Vitals:   04/29/20  0630 04/29/20 0955 04/29/20 1100  BP: (!) 170/82  (!) 181/100  Pulse: 77  77  Resp: 17  18  Temp: 97.9 F (36.6 C)    TempSrc: Oral    SpO2: 100%  100%  Weight:  78 kg   Height:  5\' 8"  (1.727 m)       Constitutional: Alert and oriented x 3.. Not in any apparent distress.  Appears anxious HEENT:      Head: Normocephalic and atraumatic.         Eyes: PERLA, EOMI, Conjunctivae are normal. Sclera is non-icteric.        Mouth/Throat: Mucous membranes are moist.       Neck: Supple with no signs of meningismus. Cardiovascular: Regular rate and rhythm. No murmurs, gallops, or rubs. 2+ symmetrical distal pulses are present . No JVD. No LE edema Respiratory: Respiratory effort normal .Lungs sounds clear bilaterally. No wheezes, crackles, or rhonchi.  Gastrointestinal: Soft, periumbilical tenderness, and non distended with hypoactive bowel sounds.  Infraumbilical surgical scar Genitourinary: No CVA tenderness. Musculoskeletal: Nontender with normal range of motion in all extremities. No cyanosis, or erythema of extremities. Neurologic:  Face is symmetric. Moving all extremities. No gross focal neurologic deficits  Skin: Skin is warm, dry.  No rash or ulcers. Psychiatric: Very anxious   Labs on Admission: I have personally reviewed following labs and imaging studies  CBC: Recent Labs  Lab 04/29/20 1047  WBC 13.4*  NEUTROABS 11.9*  HGB 11.6*  HCT 36.0  MCV 88.5  PLT 160   Basic Metabolic Panel: Recent Labs  Lab 04/29/20 1047  NA 134*  K 4.1  CL 108  CO2 16*  GLUCOSE 112*  BUN 34*  CREATININE 1.74*  CALCIUM 9.3   GFR: Estimated Creatinine Clearance: 37.3 mL/min (A) (by C-G formula based on SCr of 1.74 mg/dL (H)). Liver Function Tests: Recent Labs  Lab 04/29/20 1047  AST 27  ALT 39  ALKPHOS 245*  BILITOT 1.2  PROT 6.9  ALBUMIN 3.6   Recent Labs  Lab 04/29/20 1047  LIPASE 59*   No results for input(s): AMMONIA in the last 168 hours. Coagulation Profile: No results for input(s): INR, PROTIME in the last 168 hours. Cardiac Enzymes: No results for input(s): CKTOTAL, CKMB, CKMBINDEX, TROPONINI in the last 168 hours. BNP (last 3 results) No results for input(s): PROBNP in the last 8760 hours. HbA1C: No results for input(s): HGBA1C in the last 72 hours. CBG: No results for input(s): GLUCAP in the last 168 hours. Lipid Profile: No results for input(s): CHOL, HDL, LDLCALC,  TRIG, CHOLHDL, LDLDIRECT in the last 72 hours. Thyroid Function Tests: No results for input(s): TSH, T4TOTAL, FREET4, T3FREE, THYROIDAB in the last 72 hours. Anemia Panel: No results for input(s): VITAMINB12, FOLATE, FERRITIN, TIBC, IRON, RETICCTPCT in the last 72 hours. Urine analysis:    Component Value Date/Time   COLORURINE YELLOW (A) 04/29/2020 1218   APPEARANCEUR HAZY (A) 04/29/2020 1218   LABSPEC 1.011 04/29/2020 1218   PHURINE 5.0 04/29/2020 [place] 04/29/2020 [place] 04/29/2020 [place] 04/29/2020 [place] 04/29/2020 [place] NEGATIVE 04/29/2020 1218   NITRITE NEGATIVE 04/29/2020 1218   LEUKOCYTESUR NEGATIVE 04/29/2020 1218    Radiological Exams on Admission: CT Abdomen Pelvis Wo Contrast  Result Date: 04/29/2020 CLINICAL DATA:  Elevated white blood cell count. Elevated creatinine. Vomiting. Bowel obstruction suspected. EXAM: CT ABDOMEN AND PELVIS WITHOUT CONTRAST TECHNIQUE: Multidetector CT imaging  of the abdomen and pelvis was performed following the standard protocol without IV contrast. COMPARISON:  None FINDINGS: Lower chest: Clear lung bases. Normal heart size without pericardial or pleural effusion. Hepatobiliary: Cirrhosis, as evidenced by caudate lobe enlargement and an irregular hepatic capsule. No dominant liver mass, given limitation of noncontrast exam. Multiple gallstones without acute cholecystitis or biliary duct dilatation. Pancreas: Normal, without mass or ductal dilatation. Spleen: Normal in size, without focal abnormality. Adrenals/Urinary Tract: Normal adrenal glands. No renal calculi or hydronephrosis. No hydroureter or ureteric calculi. No bladder calculi. Stomach/Bowel: Normal stomach, without wall thickening. Hartmann's pouch. Right-sided ostomy.  At least partial colectomy. Small bowel loops are dilated and fluid-filled, including at 3.2 cm on 27/2. Distal to this, solid stool like  material within small bowel loops including on 39/2. The distal bowel is decompressed. A focal transition point is not confidently identified. Likely within mid to distal small bowel of the left upper pelvis. No pneumatosis or free intraperitoneal air. Vascular/Lymphatic: Aortic atherosclerosis. No abdominopelvic adenopathy. Reproductive: Retroverted uterus with calcified 2.6 cm fibroid within. No adnexal mass. Other: Mild pelvic floor laxity. No significant free fluid. Small volume perihepatic ascites. Extensive omental thickening and nodularity, including on 38/2. Musculoskeletal: Degenerative disc disease at the lumbosacral junction. Disc bulges at L3-4 and L4-5. IMPRESSION: 1. Mild degradation secondary to lack of oral or IV contrast. 2. Right-sided ostomy and Hartmann's pouch with at least partial colectomy. 3. Small bowel obstruction pattern with dilatation of proximal and mid small bowel loops and decompressed distal small bowel. Transition likely is within the upper left pelvis, but difficult to delineate due to lack of contrast. 4. Omental/peritoneal thickening and nodularity, highly suspicious for metastatic disease. Presumably from the patient's colonic primary. 5. Cirrhosis with small volume abdominal ascites. 6. Cholelithiasis. 7.  Aortic Atherosclerosis (ICD10-I70.0). 8. Uterine fibroid. Electronically Signed   By: Abigail Miyamoto M.D.   On: 04/29/2020 12:08     Assessment/Plan Active Problems:   SBO (small bowel obstruction) (H[facility])   RA (rheumatoid arthritis) (H[facility])   Essential hypertension   CKD (chronic kidney disease) stage 3, GFR 30-59 ml/min ([facility])   History of colon cancer     Small bowel obstruction In a patient with a history of colon cancer status post total colectomy with ileostomy She presents for evaluation of abdominal pain mostly in the periumbilical area associated with nausea and vomiting and imaging showed small bowel obstruction pattern with dilatation of proximal and mid  small bowel loops and decompressed distal small bowel. Transition likely is within the upper left pelvis, but difficult to delineate due to lack of contrast. Keep patient n.p.o. Gastric decompression with NG tube IV fluids, IV PPI and antiemetics We will consult surgery    Hypertension with chronic kidney disease stage III Renal function is stable Place patient on IV hydralazine for systolic blood pressure greater than 130mmHg    History of colon cancer Status post total colectomy with ileostomy Patient completed chemotherapy a year ago She is admitted for small bowel obstruction but imaging shows omental/peritoneal thickening and nodularity, highly suspicious for metastatic disease. Presumably from the patient's colonic primary. We will consult oncology    DVT prophylaxis: Lovenox Code Status: full code Family Communication: Greater than 50% of time was spent discussing patient's condition and plan of care with her and her daughter at the bedside.  All questions and concerns have been addressed.  They verbalized understanding and agree with the plan. Disposition Plan: Back to previous home environment Consults called: Oncology/surgery  Status: At the time of admission, it appears that the appropriate admission status for this patient is inpatient. This is judged to be reasonable and necessary in order to provide the required intensity of service to ensure the patient's safety given the presenting symptoms, physical exam findings and initial radiographic in the context of their comorbid conditions. Patient requires inpatient status due to high intensity of service, high risk for further deterioration and high frequency of surveillance required.    Collier Bullock MD [facility]     04/29/2020, 2:03 PM

## 2020-04-30 ENCOUNTER — Inpatient Hospital Stay: Payer: Medicare Other

## 2020-04-30 DIAGNOSIS — N1831 Chronic kidney disease, stage 3a: Secondary | ICD-10-CM

## 2020-04-30 LAB — CBC
HCT: 37.3 % (ref 36.0–46.0)
Hemoglobin: 12.2 g/dL (ref 12.0–15.0)
MCH: 28.8 pg (ref 26.0–34.0)
MCHC: 32.7 g/dL (ref 30.0–36.0)
MCV: 88.2 fL (ref 80.0–100.0)
Platelets: 173 10*3/uL (ref 150–400)
RBC: 4.23 MIL/uL (ref 3.87–5.11)
RDW: 15.9 % — ABNORMAL HIGH (ref 11.5–15.5)
WBC: 12.7 10*3/uL — ABNORMAL HIGH (ref 4.0–10.5)
nRBC: 0 % (ref 0.0–0.2)

## 2020-04-30 LAB — BASIC METABOLIC PANEL
Anion gap: 11 (ref 5–15)
BUN: 32 mg/dL — ABNORMAL HIGH (ref 8–23)
CO2: 16 mmol/L — ABNORMAL LOW (ref 22–32)
Calcium: 9.1 mg/dL (ref 8.9–10.3)
Chloride: 112 mmol/L — ABNORMAL HIGH (ref 98–111)
Creatinine, Ser: 1.87 mg/dL — ABNORMAL HIGH (ref 0.44–1.00)
GFR, Estimated: 30 mL/min — ABNORMAL LOW (ref >60)
Glucose, Bld: 97 mg/dL (ref 70–99)
Potassium: 4.6 mmol/L (ref 3.5–5.1)
Sodium: 139 mmol/L (ref 135–145)

## 2020-04-30 LAB — HIV ANTIBODY (ROUTINE TESTING W REFLEX): HIV Screen 4th Generation wRfx: NONREACTIVE

## 2020-04-30 MED ORDER — BLISTEX MEDICATED EX OINT
TOPICAL_OINTMENT | CUTANEOUS | Status: DC | PRN
  Filled 2020-04-30 (×2): qty 6.3

## 2020-04-30 MED ORDER — CHLORHEXIDINE GLUCONATE CLOTH 2 % EX PADS
6.0000 | MEDICATED_PAD | Freq: Every day | CUTANEOUS | Status: DC
  Administered 2020-04-30 – 2020-05-02 (×3): 6 via TOPICAL

## 2020-04-30 MED ORDER — LACTATED RINGERS IV SOLN: INTRAVENOUS | Status: DC

## 2020-04-30 NOTE — Progress Notes (Signed)
Patient NG tube otuput for shift was 255ml. Had lower abdominal pain over night, giving morphine 2 mg Q4 hr, gave zofran once and nausea resolved. Resting well right now with decreased pain. Abdominal xray done this morning.

## 2020-04-30 NOTE — Progress Notes (Signed)
[facility] SURGICAL PROGRESS NOTE (cpt [phone])  Hospital Day(s): 1.   Interval History: Patient seen and examined, no acute events or new complaints overnight. Patient reports she is overall feeling better, some lower abdominal soreness but overall improved from presentation. She denies fever, chills, nausea, emesis. Her leukocytosis is improved to 12.7K. Renal function remains at her baseline, sCr - 1.87, UO is unmeasured. No electrolyte derangements appreciated. NGT output recorded at 450 ccs. No ileostomy output.   Review of Systems:  Constitutional: denies fever, chills  HEENT: denies cough or congestion  Respiratory: denies any shortness of breath  Cardiovascular: denies chest pain or palpitations  Gastrointestinal: + abdominal pain (improved), denied N/V, or diarrhea/and bowel function as per interval history Genitourinary: denies burning with urination or urinary frequency  Vital signs in last 24 hours: [min-max] current  Temp:  [97.7 F (36.5 C)-98.3 F (36.8 C)] 97.8 F (36.6 C) (04/20 0803) Pulse Rate:  [77-100] 98 (04/20 0803) Resp:  [16-18] 16 (04/20 0803) BP: (146-181)/(70-100) 146/81 (04/20 0803) SpO2:  [97 %-100 %] 100 % (04/20 0803) Weight:  [78 kg] 78 kg (04/19 0955)     Height: 5\' 8"  (172.7 cm) Weight: 78 kg BMI (Calculated): 26.16   Intake/Output last 2 shifts:  04/19 0701 - 04/20 0700 In: 1268.6 [I.V.:1268.6] Out: 450 [Emesis/NG output:450]   Physical Exam:  Constitutional: alert, cooperative and no distress  HENT: normocephalic without obvious abnormality, NGT in place Eyes: PERRL, EOM's grossly intact and symmetric  Respiratory: breathing non-labored at rest  Cardiovascular: regular rate and sinus rhythm  Gastrointestinal: Soft, mild midline lower abdominal soreness, and non-distended, no rebound/guarding Musculoskeletal: no edema or wounds, motor and sensation grossly intact, NT    Labs:  CBC Latest Ref Rng & Units 04/30/2020 04/29/2020   WBC 4.0 - 10.5 K/uL 12.7(H) 13.4(H)  Hemoglobin 12.0 - 15.0 g/dL 12.2 11.6(L)  Hematocrit 36.0 - 46.0 % 37.3 36.0  Platelets 150 - 400 K/uL 173 162   CMP Latest Ref Rng & Units 04/30/2020 04/29/2020  Glucose 70 - 99 mg/dL 97 112(H)  BUN 8 - 23 mg/dL 32(H) 34(H)  Creatinine 0.44 - 1.00 mg/dL 1.87(H) 1.74(H)  Sodium 135 - 145 mmol/L 139 134(L)  Potassium 3.5 - 5.1 mmol/L 4.6 4.1  Chloride 98 - 111 mmol/L 112(H) 108  CO2 22 - 32 mmol/L 16(L) 16(L)  Calcium 8.9 - 10.3 mg/dL 9.1 9.3  Total Protein 6.5 - 8.1 g/dL - 6.9  Total Bilirubin 0.3 - 1.2 mg/dL - 1.2  Alkaline Phos 38 - 126 U/L - 245(H)  AST 15 - 41 U/L - 27  ALT 0 - 44 U/L - 39    Imaging studies:   KUB (04/30/2020) personally reviewed which shows small central loop of dilated small bowel, NGT in place, and radiologist report reviewed:  IMPRESSION: 1.  NG tube noted over the stomach.  2. Ostomy noted on the right. Single air-filled dilated loop of small bowel noted. Small-bowel distention may have improved from prior study. Reference is made to prior CT report of 04/29/2020.   Assessment/Plan: (ICD-10's: K80.609) 60 y.o. female with slight clinical improvement still awaiting return of bowel function admitted with possible SBO secondary to post-surgical adhesions; however, there is a high concern that this represent advancement of her disease given recent MRI findings and elevated CEA, complicated by pertinent comorbidities including history of colon CA s/p colectomy, ileostomy, and chemotherapy.   - Continue NGT decompression; LIS; monitor and record output             -  NPO + IVF Resuscitation             - Monitor abdominal examination; on-going bowel function             - Pain control prn; antiemetics prn             - Serial KUBs as needed; may consider gastrografin challenge in next 24 hours             - No emergent surgical intervention. Patient understands that should she fail to resolve with conservative measures,  surgery would need to be considered. She would prefer transfer to [place] should this need arise              - Monitor leukocytosis; improved             - Mobilization as tolerated               - Further management per primary service; we will follow     All of the above findings and recommendations were discussed with the patient, patient's family (daughters on phone), and the medical team, and all of patient's and family's questions were answered to their expressed satisfaction.  -- Edison Simon, PA-C [facility] 04/30/2020, 8:04 AM [phone] M-F: 7am - 4pm

## 2020-04-30 NOTE — Plan of Care (Signed)
  Problem: Elimination: Goal: Will not experience complications related to bowel motility Outcome: Not Progressing   Problem: Pain Managment: Goal: General experience of comfort will improve Outcome: Progressing   

## 2020-04-30 NOTE — Progress Notes (Signed)
1        [place] [facility] [place] NAME: Tanya Osborne    MR#:  [id number]  DATE OF BIRTH:  [birth year]  SUBJECTIVE:  CHIEF COMPLAINT:   Chief Complaint  Patient presents with  . Abdominal Pain  She reports improvement in her abdominal symptoms.  No nausea or vomiting.  Hoping she does not have to undergo surgery. REVIEW OF SYSTEMS:  Review of Systems  Constitutional: Negative for diaphoresis, fever, malaise/fatigue and weight loss.  HENT: Negative for ear discharge, ear pain, hearing loss, nosebleeds, sore throat and tinnitus.   Eyes: Negative for blurred vision and pain.  Respiratory: Negative for cough, hemoptysis, shortness of breath and wheezing.   Cardiovascular: Negative for chest pain, palpitations, orthopnea and leg swelling.  Gastrointestinal: Positive for abdominal pain. Negative for blood in stool, constipation, diarrhea, heartburn, nausea and vomiting.  Genitourinary: Negative for dysuria, frequency and urgency.  Musculoskeletal: Negative for back pain and myalgias.  Skin: Negative for itching and rash.  Neurological: Negative for dizziness, tingling, tremors, focal weakness, seizures, weakness and headaches.  Psychiatric/Behavioral: Negative for depression. The patient is not nervous/anxious.    DRUG ALLERGIES:   Allergies  Allergen Reactions  . Nsaids Swelling and Rash    Other Reaction: OTHER REACTION-GI BLEED  . Penicillins Swelling  . Celebrex [Celecoxib] Swelling  . Cortisone Swelling  . Gabapentin Swelling  . Methotrexate Derivatives Swelling  . Nabumetone Other (See Comments)  . Tomato Rash    Joint inflamation   VITALS:  Blood pressure (!) 158/80, pulse (!) 105, temperature 98.2 F (36.8 C), resp. rate 17, height 5\' 8"  (1.727 m), weight 78 kg, SpO2 100 %. PHYSICAL EXAMINATION:  Physical Exam  60 year old female lying in the bed comfortably without any acute distress Eyes pupils equal round reactive to light and  accommodation. NG tube in place Lungs clear to auscultation bilaterally no wheezing rales rhonchi crepitation Cardiovascular S1-S2 normal no murmur or gallop Abdomen soft, mild periumbilical area tenderness, no distention Neuro alert and oriented LABORATORY PANEL:  Female CBC Recent Labs  Lab 04/30/20 0441  WBC 12.7*  HGB 12.2  HCT 37.3  PLT 173   ------------------------------------------------------------------------------------------------------------------ Chemistries  Recent Labs  Lab 04/29/20 1047 04/30/20 0441  NA 134* 139  K 4.1 4.6  CL 108 112*  CO2 16* 16*  GLUCOSE 112* 97  BUN 34* 32*  CREATININE 1.74* 1.87*  CALCIUM 9.3 9.1  AST 27  --   ALT 39  --   ALKPHOS 245*  --   BILITOT 1.2  --    RADIOLOGY:  DG Abd 2 Views  Result Date: 04/30/2020 CLINICAL DATA:  Small-bowel obstruction. EXAM: ABDOMEN - 2 VIEW COMPARISON:  CT 04/29/2020. FINDINGS: NG tube noted with tip over the stomach. Ostomy noted on the right. Single air-filled dilated loop of small bowel noted. Small-bowel distention may have improved from prior study. Reference is made to prior CT report of 04/29/2020. No free air identified. Calcified fibroid again noted. Pelvic calcifications consistent phleboliths. No acute bony abnormality. Mild left base subsegmental atelectasis. Port-A-Cath noted. IMPRESSION: 1.  NG tube noted over the stomach. 2. Ostomy noted on the right. Single air-filled dilated loop of small bowel noted. Small-bowel distention may have improved from prior study. Reference is made to prior CT report of 04/29/2020. 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  N.p.o. for now, continue IV fluid.  NG tube decompression with LIS If she fails conservative management consider transfer to [facility] for complex surgery need  Essential hypertension As needed hydralazine  CKD stage IIIa Stable kidney function  History of stage III colon cancer status post total colectomy and ileostomy Followed by oncology   Body mass index is 26.15 kg/m.  Net IO Since Admission: 818.63 mL [04/30/20 1317]      Status is: Inpatient  Remains inpatient appropriate because:Inpatient level of care appropriate due to severity of illness   Dispo: The patient is from: Home              Anticipated d/c is to: Home              Patient currently is not medically stable to d/c.   Difficult to place patient No       DVT prophylaxis:       enoxaparin (LOVENOX) injection 40 mg Start: 04/29/20 2200     Family Communication: discussed with patient   All the records are reviewed and case discussed with Care Management/Social Worker. Management plans discussed with the patient, nursing and they are in agreement.  CODE STATUS: Full Code Level of care: Med-Surg  TOTAL TIME TAKING CARE OF THIS PATIENT: 35 minutes.   More than 50% of the time was spent in counseling/coordination of care: YES  POSSIBLE D/C IN 2-3 DAYS, DEPENDING ON CLINICAL CONDITION.   Max Sane M.D on 04/30/2020 at 1:17 PM  [facility]   CC: Primary care physician; Pcp, No  Note: This dictation was prepared with Dragon dictation along with smaller phrase technology. Any transcriptional errors that result from this process are unintentional.

## 2020-04-30 NOTE — Plan of Care (Signed)
  Problem: Clinical Measurements: ?Goal: Diagnostic test results will improve ?Outcome: Progressing ?  ?Problem: Nutrition: ?Goal: Adequate nutrition will be maintained ?Outcome: Progressing ?  ?Problem: Elimination: ?Goal: Will not experience complications related to bowel motility ?Outcome: Progressing ?Goal: Will not experience complications related to urinary retention ?Outcome: Progressing ?  ?Problem: Pain Managment: ?Goal: General experience of comfort will improve ?Outcome: Progressing ?  ?

## 2020-05-01 ENCOUNTER — Encounter: Payer: Self-pay | Admitting: Internal Medicine

## 2020-05-01 DIAGNOSIS — M069 Rheumatoid arthritis, unspecified: Secondary | ICD-10-CM

## 2020-05-01 LAB — BASIC METABOLIC PANEL
Anion gap: 8 (ref 5–15)
BUN: 33 mg/dL — ABNORMAL HIGH (ref 8–23)
CO2: 17 mmol/L — ABNORMAL LOW (ref 22–32)
Calcium: 8.7 mg/dL — ABNORMAL LOW (ref 8.9–10.3)
Chloride: 116 mmol/L — ABNORMAL HIGH (ref 98–111)
Creatinine, Ser: 2.03 mg/dL — ABNORMAL HIGH (ref 0.44–1.00)
GFR, Estimated: 27 mL/min — ABNORMAL LOW (ref >60)
Glucose, Bld: 75 mg/dL (ref 70–99)
Potassium: 4 mmol/L (ref 3.5–5.1)
Sodium: 141 mmol/L (ref 135–145)

## 2020-05-01 LAB — CBC
HCT: 31.9 % — ABNORMAL LOW (ref 36.0–46.0)
Hemoglobin: 10.2 g/dL — ABNORMAL LOW (ref 12.0–15.0)
MCH: 29.1 pg (ref 26.0–34.0)
MCHC: 32 g/dL (ref 30.0–36.0)
MCV: 90.9 fL (ref 80.0–100.0)
Platelets: 145 10*3/uL — ABNORMAL LOW (ref 150–400)
RBC: 3.51 MIL/uL — ABNORMAL LOW (ref 3.87–5.11)
RDW: 16.4 % — ABNORMAL HIGH (ref 11.5–15.5)
WBC: 6.8 10*3/uL (ref 4.0–10.5)
nRBC: 0 % (ref 0.0–0.2)

## 2020-05-01 MED ORDER — PREDNISONE 5 MG PO TABS
5.0000 mg | ORAL_TABLET | Freq: Every day | ORAL | Status: DC
  Administered 2020-05-02: 5 mg via ORAL
  Filled 2020-05-01 (×2): qty 1

## 2020-05-01 MED ORDER — VITAMIN D 25 MCG (1000 UNIT) PO TABS
2000.0000 [IU] | ORAL_TABLET | Freq: Every day | ORAL | Status: DC
  Administered 2020-05-02: 2000 [IU] via ORAL
  Filled 2020-05-01: qty 2

## 2020-05-01 MED ORDER — FERROUS SULFATE 325 (65 FE) MG PO TABS
325.0000 mg | ORAL_TABLET | Freq: Every day | ORAL | Status: DC
  Administered 2020-05-02: 325 mg via ORAL
  Filled 2020-05-01: qty 1

## 2020-05-01 MED ORDER — BOOST / RESOURCE BREEZE PO LIQD CUSTOM
1.0000 | Freq: Three times a day (TID) | ORAL | Status: DC
  Administered 2020-05-01 – 2020-05-02 (×4): 1 via ORAL

## 2020-05-01 MED ORDER — HYDROCODONE-ACETAMINOPHEN 7.5-325 MG PO TABS
1.0000 | ORAL_TABLET | Freq: Four times a day (QID) | ORAL | Status: DC | PRN
  Administered 2020-05-01 – 2020-05-02 (×2): 1 via ORAL
  Filled 2020-05-01 (×2): qty 1

## 2020-05-01 MED ORDER — PANTOPRAZOLE SODIUM 40 MG PO TBEC
40.0000 mg | DELAYED_RELEASE_TABLET | Freq: Every day | ORAL | Status: DC
  Administered 2020-05-01 – 2020-05-02 (×2): 40 mg via ORAL
  Filled 2020-05-01 (×2): qty 1

## 2020-05-01 NOTE — Progress Notes (Signed)
1        [place] at [place] NAME: Maryland Stell    MR#:  [id number]  DATE OF BIRTH:  [birth year]  SUBJECTIVE:  CHIEF COMPLAINT:   Chief Complaint  Patient presents with  . Abdominal Pain  She is feeling much better.  Agreeable with clear liquids.  Requesting water.  Appreciative of her care REVIEW OF SYSTEMS:  Review of Systems  Constitutional: Negative for diaphoresis, fever, malaise/fatigue and weight loss.  HENT: Negative for ear discharge, ear pain, hearing loss, nosebleeds, sore throat and tinnitus.   Eyes: Negative for blurred vision and pain.  Respiratory: Negative for cough, hemoptysis, shortness of breath and wheezing.   Cardiovascular: Negative for chest pain, palpitations, orthopnea and leg swelling.  Gastrointestinal: Negative for blood in stool, constipation, diarrhea, heartburn, nausea and vomiting.  Genitourinary: Negative for dysuria, frequency and urgency.  Musculoskeletal: Negative for back pain and myalgias.  Skin: Negative for itching and rash.  Neurological: Negative for dizziness, tingling, tremors, focal weakness, seizures, weakness and headaches.  Psychiatric/Behavioral: Negative for depression. The patient is not nervous/anxious.    DRUG ALLERGIES:   Allergies  Allergen Reactions  . Nsaids Swelling and Rash    Other Reaction: OTHER REACTION-GI BLEED  . Penicillins Swelling  . Celebrex [Celecoxib] Swelling  . Cortisone Swelling  . Gabapentin Swelling  . Methotrexate Derivatives Swelling  . Nabumetone Other (See Comments)  . Tomato Rash    Joint inflamation   VITALS:  Blood pressure (!) 152/74, pulse 82, temperature 98.4 F (36.9 C), temperature source Oral, resp. rate 15, height 5\' 8"  (1.727 m), weight 78 kg, SpO2 100 %. PHYSICAL EXAMINATION:  Physical Exam  60 year old female lying in the bed comfortably without any acute distress Eyes pupils equal round reactive to light and accommodation. NG tube in place Lungs  clear to auscultation bilaterally no wheezing rales rhonchi crepitation Cardiovascular S1-S2 normal no murmur or gallop Abdomen soft, mild periumbilical area tenderness, no distention Neuro alert and oriented LABORATORY PANEL:  Female CBC Recent Labs  Lab 05/01/20 0428  WBC 6.8  HGB 10.2*  HCT 31.9*  PLT 145*   ------------------------------------------------------------------------------------------------------------------ Chemistries  Recent Labs  Lab 04/29/20 1047 04/30/20 0441 05/01/20 0428  NA 134*   < > 141  K 4.1   < > 4.0  CL 108   < > 116*  CO2 16*   < > 17*  GLUCOSE 112*   < > 75  BUN 34*   < > 33*  CREATININE 1.74*   < > 2.03*  CALCIUM 9.3   < > 8.7*  AST 27  --   --   ALT 39  --   --   ALKPHOS 245*  --   --   BILITOT 1.2  --   --    < > = values in this interval not displayed.   RADIOLOGY:  No results found. 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  Clear liquid diet started and tolerating thus far.  Will advance as tolerated per surgery  Essential hypertension As needed hydralazine  CKD stage IIIa Stable kidney function  History of stage III colon cancer status  post total colectomy and ileostomy Followed by oncology   Body mass index is 26.15 kg/m.  Net IO Since Admission: 970.85 mL [05/01/20 1126]      Status is: Inpatient  Remains inpatient appropriate because:Inpatient level of care appropriate due to severity of illness   Dispo: The patient is from: Home              Anticipated d/c is to: Home in 1 to 2 days               Patient currently is not medically stable to d/c.   Difficult to place patient No    DVT prophylaxis:       enoxaparin (LOVENOX) injection 40 mg Start: 04/29/20 2200     Family Communication: Updated patient's both daughter over phone on 4/21   All the records are reviewed and case discussed with Care Management/Social Worker. Management plans discussed with the patient, family and they are in agreement.  CODE STATUS: Full Code Level of care: Med-Surg  TOTAL TIME TAKING CARE OF THIS PATIENT: 35 minutes.   More than 50% of the time was spent in counseling/coordination of care: YES  POSSIBLE D/C IN 1-2 DAYS, DEPENDING ON CLINICAL CONDITION.   Max Sane M.D on 05/01/2020 at 11:26 AM  [facility]   CC: Primary care physician; Pcp, No  Note: This dictation was prepared with Dragon dictation along with smaller phrase technology. Any transcriptional errors that result from this process are unintentional.

## 2020-05-01 NOTE — Progress Notes (Signed)
[facility] SURGICAL PROGRESS NOTE (cpt [phone])  Hospital Day(s): 2.   Interval History: Patient seen and examined, no acute events or new complaints overnight. Patient reports she is feeling better this morning and had resolution in her abdominal pain from yesterday. She denies fever, chills, nausea, emesis. Her leukocytosis has resolved, now 6.8K. Hgb down to 10.2 but this is almost certainly dilutional in nature.Renal function remains near her baseline but is increasing, sCr - 2.03, UO - 100 ccs + unmeasured. No significant electrolyte derangements. No new images this morning. NGT output only recorded at 150 ccs. Her ileostomy regained function yesterday afternoon and has had 1.0L of output since. She is ambulating well  Review of Systems:  Constitutional: denies fever, chills  HEENT: denies cough or congestion  Respiratory: denies any shortness of breath  Cardiovascular: denies chest pain or palpitations  Gastrointestinal: denies abdominal pain, N/V, or diarrhea/and bowel function as per interval history Genitourinary: denies burning with urination or urinary frequency  Vital signs in last 24 hours: [min-max] current  Temp:  [97.5 F (36.4 C)-98.2 F (36.8 C)] 97.5 F (36.4 C) (04/21 0325) Pulse Rate:  [96-109] 100 (04/21 0325) Resp:  [16-20] 20 (04/21 0325) BP: (144-158)/(71-82) 156/71 (04/21 0325) SpO2:  [100 %] 100 % (04/21 0325)     Height: 5\' 8"  (172.7 cm) Weight: 78 kg BMI (Calculated): 26.16   Intake/Output last 2 shifts:  04/20 0701 - 04/21 0700 In: 1042.2 [I.V.:1042.2] Out: 1250 [Urine:100; Emesis/NG output:150; Stool:1000]   Physical Exam:  Constitutional: alert, cooperative and no distress  HENT: normocephalic without obvious abnormality, NGT in place (removed this) Eyes: PERRL, EOM's grossly intact and symmetric  Respiratory: breathing non-labored at rest  Cardiovascular: regular rate and sinus rhythm  Gastrointestinal: Soft, non-tender, and  non-distended, no rebound/guarding. Ileostomy in the left mid abdomen, stool and gas in bag Musculoskeletal: no edema or wounds, motor and sensation grossly intact, NT   Labs:  CBC Latest Ref Rng & Units 05/01/2020 04/30/2020 04/29/2020  WBC 4.0 - 10.5 K/uL 6.8 12.7(H) 13.4(H)  Hemoglobin 12.0 - 15.0 g/dL 10.2(L) 12.2 11.6(L)  Hematocrit 36.0 - 46.0 % 31.9(L) 37.3 36.0  Platelets 150 - 400 K/uL 145(L) 173 162   CMP Latest Ref Rng & Units 05/01/2020 04/30/2020 04/29/2020  Glucose 70 - 99 mg/dL 75 97 112(H)  BUN 8 - 23 mg/dL 33(H) 32(H) 34(H)  Creatinine 0.44 - 1.00 mg/dL 2.03(H) 1.87(H) 1.74(H)  Sodium 135 - 145 mmol/L 141 139 134(L)  Potassium 3.5 - 5.1 mmol/L 4.0 4.6 4.1  Chloride 98 - 111 mmol/L 116(H) 112(H) 108  CO2 22 - 32 mmol/L 17(L) 16(L) 16(L)  Calcium 8.9 - 10.3 mg/dL 8.7(L) 9.1 9.3  Total Protein 6.5 - 8.1 g/dL - - 6.9  Total Bilirubin 0.3 - 1.2 mg/dL - - 1.2  Alkaline Phos 38 - 126 U/L - - 245(H)  AST 15 - 41 U/L - - 27  ALT 0 - 44 U/L - - 39    Imaging studies: No new pertinent imaging studies   Assessment/Plan: (ICD-10's: K60.609) 59 y.o. female with return of bowel function admitted with possible SBO secondary to post-surgical adhesions; however, there is a high concern that this represent advancement of her disease given recent MRI findings and elevated CEA, complicated by pertinent comorbidities includinghistory of colon CA s/p colectomy, ileostomy, and chemotherapy.   - Will remove NGT and initiate CLD  - Wean IVF Resuscitation as diet progresses - Monitor abdominal examination; on-going bowel function - Pain control  prn; antiemetics prn   - No emergent surgical intervention. Patient understands that should she fail to resolve with conservative measures, surgery would need to be considered. She would prefer transfer to [facility] should this need arise - Monitor leukocytosis; now resolved - Mobilization as tolerated   - Further management per primary service; we will follow    All of the above findings and recommendations were discussed with the patient, patient's family (daughter's on the phone), and the medical team, and all of patient's and family's questions were answered to their expressed satisfaction.  -- Edison Simon, PA-C [facility] 05/01/2020, 7:05 AM [phone] M-F: 7am - 4pm

## 2020-05-02 NOTE — Discharge Instructions (Signed)
Bowel Obstruction A bowel obstruction means that something is blocking the small or large bowel. The bowel is also called the intestine. It is the long tube that connects the stomach to the opening of the butt (anus). When something blocks the bowel, food and fluids cannot pass through like normal. This condition needs to be treated. Treatment depends on the cause of the problem and how bad the problem is. What are the causes? Common causes of this condition include:  Scar tissue (adhesions) from past surgery or from high-energy X-rays (radiation).  Recent surgery in the belly. This affects how food moves in the bowel.  Some diseases, such as: ? Irritation of the lining of the digestive tract (Crohn's disease). ? Irritation of small pouches in the bowel (diverticulitis).  Growths or tumors.  A bulging organ (hernia).  Twisting of the bowel (volvulus).  A foreign body.  Slipping of a part of the bowel into another part (intussusception).   What are the signs or symptoms? Symptoms of this condition include:  Pain in the belly.  Feeling sick to your stomach (nauseous).  Throwing up (vomiting).  Bloating in the belly.  Being unable to pass gas.  Trouble pooping (constipation).  Watery poop (diarrhea).  A lot of belching. How is this diagnosed? This condition may be diagnosed based on:  A physical exam.  Medical history.  Imaging tests, such as X-ray or CT scan.  Blood tests.  Urine tests. How is this treated? Treatment for this condition may include:  Fluids and pain medicines that are given through an IV tube. Your doctor may tell you not to eat or drink if you feel sick to your stomach and are throwing up.  Eating a clear liquid diet for a few days.  Putting a small tube (nasogastric tube) into the stomach. This will help with pain, discomfort, and nausea by removing blocked air and fluids from the stomach.  Surgery. This may be needed if other treatments do  not work. Follow these instructions at home: Medicines  Take over-the-counter and prescription medicines only as told by your doctor.  If you were prescribed an antibiotic medicine, take it as told by your doctor. Do not stop taking the antibiotic even if you start to feel better. General instructions  Follow your diet as told by your doctor. You may need to: ? Only drink clear liquids until you start to get better. ? Avoid solid foods.  Return to your normal activities as told by your doctor. Ask your doctor what activities are safe for you.  Do not sit for a long time without moving. Get up to take short walks every 1-2 hours. This is important. Ask for help if you feel weak or unsteady.  Keep all follow-up visits as told by your doctor. This is important. How is this prevented? After having a bowel obstruction, you may be more likely to have another. You can do some things to stop it from happening again.  If you have a long-term (chronic) disease, contact your doctor if you see changes or problems.  Take steps to prevent or treat trouble pooping. Your doctor may ask that you: ? Drink enough fluid to keep your pee (urine) pale yellow. ? Take over-the-counter or prescription medicines. ? Eat foods that are high in fiber. These include beans, whole grains, and fresh fruits and vegetables. ? Limit foods that are high in fat and sugar. These include fried or sweet foods.  Stay active. Ask your doctor which   exercises are safe for you.  Avoid stress.  Eat three small meals and three small snacks each day.  Work with a food expert (dietitian) to make a meal plan that works for you.  Do not use any products that contain nicotine or tobacco, such as cigarettes and e-cigarettes. If you need help quitting, ask your doctor.   Contact a doctor if:  You have a fever.  You have chills. Get help right away if:  You have pain or cramps that get worse.  You throw up blood.  You are  sick to your stomach.  You cannot stop throwing up.  You cannot drink fluids.  You feel mixed up (confused).  You feel very thirsty (dehydrated).  Your belly gets more bloated.  You feel weak or you pass out (faint). Summary  A bowel obstruction means that something is blocking the small or large bowel.  Treatment may include IV fluids and pain medicine. You may also have a clear liquid diet, a small tube in your stomach, or surgery.  Drink clear liquids and avoid solid foods until you get better. This information is not intended to replace advice given to you by your health care provider. Make sure you discuss any questions you have with your health care provider. Document Revised: 05/11/2017 Document Reviewed: 05/11/2017 Elsevier Patient Education  2021 Elsevier Inc.  

## 2020-05-02 NOTE — Progress Notes (Signed)
[facility] SURGICAL PROGRESS NOTE (cpt [phone])  Hospital Day(s): 3.   Interval History: Patient seen and examined, no acute events or new complaints overnight. Patient reports she continues to feel well and remains without abdominal pain. She denies fever, chills, nausea, emesis. No new labs or imaging studies this morning. She was able to get her NGT out yesterday after return of significant ileostomy function. She was started on CLD and subsequently advanced to full liquids in the evening. She continues to have ileostomy function.   Review of Systems:  Constitutional: denies fever, chills  HEENT: denies cough or congestion  Respiratory: denies any shortness of breath  Cardiovascular: denies chest pain or palpitations  Gastrointestinal: denies abdominal pain, N/V, or diarrhea/and bowel function as per interval history Genitourinary: denies burning with urination or urinary frequency  Vital signs in last 24 hours: [min-max] current  Temp:  [97.7 F (36.5 C)-98.9 F (37.2 C)] 97.8 F (36.6 C) (04/22 0445) Pulse Rate:  [82-109] 93 (04/22 0445) Resp:  [15-20] 20 (04/22 0445) BP: (132-166)/(69-79) 142/79 (04/22 0445) SpO2:  [100 %] 100 % (04/22 0445)     Height: 5\' 8"  (172.7 cm) Weight: 78 kg BMI (Calculated): 26.16   Intake/Output last 2 shifts:  04/21 0701 - 04/22 0700 In: 480 [P.O.:480] Out: 500 [Stool:500]   Physical Exam:  Constitutional: alert, cooperative and no distress  HENT: normocephalic without obvious abnormality Eyes: PERRL, EOM's grossly intact and symmetric  Respiratory: breathing non-labored at rest  Cardiovascular: regular rate and sinus rhythm  Gastrointestinal:Soft,non-tender, and non-distended, no rebound/guarding. Ileostomy in the left mid abdomen, stool and gas in bag Musculoskeletal: no edema or wounds, motor and sensation grossly intact, NT    Labs:  CBC Latest Ref Rng & Units 05/01/2020 04/30/2020 04/29/2020  WBC 4.0 - 10.5 K/uL 6.8  12.7(H) 13.4(H)  Hemoglobin 12.0 - 15.0 g/dL 10.2(L) 12.2 11.6(L)  Hematocrit 36.0 - 46.0 % 31.9(L) 37.3 36.0  Platelets 150 - 400 K/uL 145(L) 173 162   CMP Latest Ref Rng & Units 05/01/2020 04/30/2020 04/29/2020  Glucose 70 - 99 mg/dL 75 97 112(H)  BUN 8 - 23 mg/dL 33(H) 32(H) 34(H)  Creatinine 0.44 - 1.00 mg/dL 2.03(H) 1.87(H) 1.74(H)  Sodium 135 - 145 mmol/L 141 139 134(L)  Potassium 3.5 - 5.1 mmol/L 4.0 4.6 4.1  Chloride 98 - 111 mmol/L 116(H) 112(H) 108  CO2 22 - 32 mmol/L 17(L) 16(L) 16(L)  Calcium 8.9 - 10.3 mg/dL 8.7(L) 9.1 9.3  Total Protein 6.5 - 8.1 g/dL - - 6.9  Total Bilirubin 0.3 - 1.2 mg/dL - - 1.2  Alkaline Phos 38 - 126 U/L - - 245(H)  AST 15 - 41 U/L - - 27  ALT 0 - 44 U/L - - 39    Imaging studies: No new pertinent imaging studies   Assessment/Plan: (ICD-10's: K21.609) 60 y.o. female with continued bowel function admitted withpossible SBO secondary to post-surgical adhesions; however, there is a high concern that this represent advancement of her disease given recent MRI findings and elevated CEA, complicated by pertinent comorbidities includinghistory of colon CA s/p colectomy, ileostomy, and chemotherapy.   - Will advance to soft diet  - Discontinue IVF resuscitation  - Monitor abdominal examination; on-going bowel function - Pain control prn; antiemetics prn              - No emergent surgical intervention - Mobilization as tolerated  - Further management per primary service   - Discharge Planning: If she tolerates soft diet today,  okay to discharge from surgical standpoint this afternoon. She can follow up with PCP or surgeon at [facility] for this.   All of the above findings and recommendations were discussed with the patient, and the medical team, and all of patient's questions were answered to her expressed satisfaction.  -- Edison Simon, PA-C [facility] 05/02/2020, 7:15 AM [phone] M-F: 7am -  4pm

## 2020-05-02 NOTE — Progress Notes (Signed)
Pt discharged home with her daughters.  VSS and pt denies any pain or problems.  All discharge instructions provided and pt belongings sent with pt.  Pt was discharged via wheelchair.

## 2020-05-02 NOTE — Care Management Important Message (Signed)
Important Message  Patient Details  Name: Tanya Osborne MRN: [id number] Date of Birth: 1960/07/29   Medicare Important Message Given:  Yes     [name] 05/02/2020, 11:54 AM

## 2020-05-03 NOTE — Discharge Summary (Signed)
[facility] NAME: Tanya Osborne    MR#:  [id number]  DATE OF BIRTH:  [birth year]  DATE OF ADMISSION:  04/29/2020   ADMITTING PHYSICIAN: Collier Bullock, MD  DATE OF DISCHARGE: 05/02/2020  4:08 PM  PRIMARY CARE PHYSICIAN: Annice Needy, MD   ADMISSION DIAGNOSIS:  Small bowel obstruction ([place]) [K56.609] SBO (small bowel obstruction) ([place]) [K56.609] History of colon cancer [Z85.038] DISCHARGE DIAGNOSIS:  Active Problems:   Small bowel obstruction (HCC)   RA (rheumatoid arthritis) (HCC)   Essential hypertension   CKD (chronic kidney disease) stage 3, GFR 30-59 ml/min (HCC)   History of colon cancer  SECONDARY DIAGNOSIS:   Past Medical History:  Diagnosis Date  . Autoimmune liver disease   . Cancer ([place])   . Depression   . Duodenal ulcer perforation ([facility]) 09/08/2008  . GERD (gastroesophageal reflux disease)   . H. pylori infection 2010  . Hypercholesterolemia 2018  . Iron deficiency anemia   . Kidney disease, chronic, stage IV (GFR 15-29 ml/min) (HCC)   . 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 RA (rheumatoid arthritis) 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  Essential hypertension controlled  CKD stage IIIa Stable kidney function  History of stage III colon cancer status post total colectomy and ileostomy outpt oncology f/up    DISCHARGE CONDITIONS:  stable CONSULTS OBTAINED:  Treatment Team:  Olean Ree, MD Sindy Guadeloupe, MD DRUG ALLERGIES:   Allergies  Allergen Reactions  .  Nsaids Swelling and Rash    Other Reaction: OTHER REACTION-GI BLEED  . Penicillins Swelling  . Celebrex [Celecoxib] Swelling  . Cortisone Swelling  . Gabapentin Swelling  . Methotrexate Derivatives Swelling  . Nabumetone Other (See Comments)  . Tomato Rash    Joint inflamation   DISCHARGE MEDICATIONS:   Allergies as of 05/02/2020      Reactions   Nsaids Swelling, Rash   Other Reaction: OTHER REACTION-GI BLEED   Penicillins Swelling   Celebrex [celecoxib] Swelling   Cortisone Swelling   Gabapentin Swelling   Methotrexate Derivatives Swelling   Nabumetone Other (See Comments)   Tomato Rash   Joint inflamation      Medication List    TAKE these medications   Cholecalciferol 50 MCG (2000 UT) Caps Take 2,000 Units by mouth daily.   clobetasol ointment 0.05 % Commonly known as: TEMOVATE Apply 1 application topically 2 (two) times daily.   ferrous sulfate 325 (65 FE) MG tablet Take 325 mg by mouth daily with breakfast.   lidocaine-prilocaine cream Commonly known as: EMLA Apply 1 application topically once.   Multi-Vitamin tablet Take 1 tablet by mouth daily.   pantoprazole 20 MG tablet Commonly known as: PROTONIX Take 20 mg by mouth daily.   predniSONE 5 MG tablet Commonly known as: DELTASONE Take 5 mg by mouth daily with breakfast.      DISCHARGE INSTRUCTIONS:   DIET:  Regular diet DISCHARGE CONDITION:  Good ACTIVITY:  Activity as tolerated OXYGEN:  Home Oxygen: No.  Oxygen Delivery: room air DISCHARGE LOCATION:  home   If you experience worsening of your admission symptoms, develop shortness of breath, life threatening  emergency, suicidal or homicidal thoughts you must seek medical attention immediately by calling 911 or calling your MD immediately  if symptoms less severe.  You Must read complete instructions/literature along with all the possible adverse reactions/side effects for all the Medicines you take and that have been prescribed to you.  Take any new Medicines after you have completely understood and accpet all the possible adverse reactions/side effects.   Please note  You were cared for by a hospitalist during your hospital stay. If you have any questions about your discharge medications or the care you received while you were in the hospital after you are discharged, you can call the unit and asked to speak with the hospitalist on call if the hospitalist that took care of you is not available. Once you are discharged, your primary care physician will handle any further medical issues. Please note that NO REFILLS for any discharge medications will be authorized once you are discharged, as it is imperative that you return to your primary care physician (or establish a relationship with a primary care physician if you do not have one) for your aftercare needs so that they can reassess your need for medications and monitor your lab values.    On the day of Discharge:  VITAL SIGNS:  Blood pressure (!) 158/76, pulse 78, temperature 97.7 F (36.5 C), temperature source Oral, resp. rate 16, height 5\' 8"  (1.727 m), weight 78 kg, SpO2 100 %. PHYSICAL EXAMINATION:  GENERAL:  60 y.o.-year-old patient lying in the bed with no acute distress.  EYES: Pupils equal, round, reactive to light and accommodation. No scleral icterus. Extraocular muscles intact.  HEENT: Head atraumatic, normocephalic. Oropharynx and nasopharynx clear.  NECK:  Supple, no jugular venous distention. No thyroid enlargement, no tenderness.  LUNGS: Normal breath sounds bilaterally, no wheezing, rales,rhonchi or crepitation. No use of accessory muscles of respiration.  CARDIOVASCULAR: S1, S2 normal. No murmurs, rubs, or gallops.  ABDOMEN: Soft, non-tender, non-distended. Bowel sounds present. No organomegaly or mass.  EXTREMITIES: No pedal edema, cyanosis, or clubbing.  NEUROLOGIC: Cranial nerves II through XII are intact. Muscle strength 5/5 in all extremities. Sensation  intact. Gait not checked.  PSYCHIATRIC: The patient is alert and oriented x 3.  SKIN: No obvious rash, lesion, or ulcer.  DATA REVIEW:   CBC Recent Labs  Lab 05/01/20 0428  WBC 6.8  HGB 10.2*  HCT 31.9*  PLT 145*    Chemistries  Recent Labs  Lab 04/29/20 1047 04/30/20 0441 05/01/20 0428  NA 134*   < > 141  K 4.1   < > 4.0  CL 108   < > 116*  CO2 16*   < > 17*  GLUCOSE 112*   < > 75  BUN 34*   < > 33*  CREATININE 1.74*   < > 2.03*  CALCIUM 9.3   < > 8.7*  AST 27  --   --   ALT 39  --   --   ALKPHOS 245*  --   --   BILITOT 1.2  --   --    < > = values in this interval not displayed.     Outpatient follow-up  Follow-up Information    Annice Needy, MD. Go on 05/05/2020.   Specialty: Internal Medicine Why: 10am appointment Contact information: [address] [place] [address] [phone]        Tylene Fantasia, PA-C. Go on 05/15/2020.   Specialty: Physician Assistant Why: 11:15am appointment  Contact information: [address] [place] [address] [phone]               33 Day Unplanned Readmission Risk Score   Flowsheet Row ED to Hosp-Admission (Discharged) from 04/29/2020 in [facility]  30 Day Unplanned Readmission Risk Score (%) 9.74 Filed at 05/02/2020 1600     This score is the patient's risk of an unplanned readmission within 30 days of being discharged (0 -100%). The score is based on dignosis, age, lab data, medications, orders, and past utilization.   Low:  0-14.9   Medium: 15-21.9   High: 22-29.9   Extreme: 30 and above         Management plans discussed with the patient, family and they are in agreement.  CODE STATUS: Prior   TOTAL TIME TAKING CARE OF THIS PATIENT: 45 minutes.    Max Sane M.D on 05/03/2020 at 4:07 PM  [facility]   CC: Primary care physician; Annice Needy, MD   Note: This dictation was prepared with Dragon dictation along  with smaller phrase technology. Any transcriptional errors that result from this process are unintentional.

## 2020-05-15 ENCOUNTER — Inpatient Hospital Stay: Payer: Medicare Other | Admitting: Physician Assistant

## 2020-08-14 ENCOUNTER — Other Ambulatory Visit: Payer: Self-pay

## 2020-08-14 ENCOUNTER — Emergency Department
Admission: EM | Admit: 2020-08-14 | Discharge: 2020-08-15 | Disposition: A | Discharge status: Home / Self Care | Payer: Medicare Other | Attending: Emergency Medicine | Admitting: Emergency Medicine

## 2020-08-14 DIAGNOSIS — I129 Hypertensive chronic kidney disease with stage 1 through stage 4 chronic kidney disease, or unspecified chronic kidney disease: Secondary | ICD-10-CM | POA: Diagnosis not present

## 2020-08-14 DIAGNOSIS — E46 Unspecified protein-calorie malnutrition: Secondary | ICD-10-CM | POA: Insufficient documentation

## 2020-08-14 DIAGNOSIS — R531 Weakness: Secondary | ICD-10-CM

## 2020-08-14 DIAGNOSIS — Z859 Personal history of malignant neoplasm, unspecified: Secondary | ICD-10-CM | POA: Diagnosis not present

## 2020-08-14 DIAGNOSIS — N184 Chronic kidney disease, stage 4 (severe): Secondary | ICD-10-CM | POA: Diagnosis not present

## 2020-08-14 LAB — CBC WITH DIFFERENTIAL/PLATELET
Abs Immature Granulocytes: 0.07 10*3/uL (ref 0.00–0.07)
Basophils Absolute: 0 10*3/uL (ref 0.0–0.1)
Basophils Relative: 0 %
Eosinophils Absolute: 0.1 10*3/uL (ref 0.0–0.5)
Eosinophils Relative: 1 %
HCT: 41.4 % (ref 36.0–46.0)
Hemoglobin: 13.3 g/dL (ref 12.0–15.0)
Immature Granulocytes: 1 %
Lymphocytes Relative: 12 %
Lymphs Abs: 1.6 10*3/uL (ref 0.7–4.0)
MCH: 30 pg (ref 26.0–34.0)
MCHC: 32.1 g/dL (ref 30.0–36.0)
MCV: 93.2 fL (ref 80.0–100.0)
Monocytes Absolute: 0.8 10*3/uL (ref 0.1–1.0)
Monocytes Relative: 6 %
Neutro Abs: 10.8 10*3/uL — ABNORMAL HIGH (ref 1.7–7.7)
Neutrophils Relative %: 80 %
Platelets: 186 10*3/uL (ref 150–400)
RBC: 4.44 MIL/uL (ref 3.87–5.11)
RDW: 14.6 % (ref 11.5–15.5)
WBC: 13.4 10*3/uL — ABNORMAL HIGH (ref 4.0–10.5)
nRBC: 0 % (ref 0.0–0.2)

## 2020-08-14 LAB — COMPREHENSIVE METABOLIC PANEL
ALT: 33 U/L (ref 0–44)
AST: 26 U/L (ref 15–41)
Albumin: 4 g/dL (ref 3.5–5.0)
Alkaline Phosphatase: 238 U/L — ABNORMAL HIGH (ref 38–126)
Anion gap: 7 (ref 5–15)
BUN: 40 mg/dL — ABNORMAL HIGH (ref 8–23)
CO2: 21 mmol/L — ABNORMAL LOW (ref 22–32)
Calcium: 9.4 mg/dL (ref 8.9–10.3)
Chloride: 108 mmol/L (ref 98–111)
Creatinine, Ser: 1.93 mg/dL — ABNORMAL HIGH (ref 0.44–1.00)
GFR, Estimated: 29 mL/min — ABNORMAL LOW (ref >60)
Glucose, Bld: 99 mg/dL (ref 70–99)
Potassium: 4.3 mmol/L (ref 3.5–5.1)
Sodium: 136 mmol/L (ref 135–145)
Total Bilirubin: 1 mg/dL (ref 0.3–1.2)
Total Protein: 7.2 g/dL (ref 6.5–8.1)

## 2020-08-14 LAB — TROPONIN I (HIGH SENSITIVITY): Troponin I (High Sensitivity): 12 ng/L (ref <18)

## 2020-08-14 NOTE — ED Triage Notes (Signed)
Pt states that she has felt weak, like she was going to pass out, has had poor PO intake- states this has been going on for 2 weeks- pt states that her nephrologist started her on a new medication that may have made her feel like this- pt has stage 4 colon cancer

## 2020-08-14 NOTE — ED Provider Notes (Signed)
[facility] Emergency Department Provider Note  ____________________________________________   Event Date/Time   First MD Initiated Contact with Patient 08/14/20 2328     (approximate)  I have reviewed the triage vital signs and the nursing notes.   HISTORY  Chief Complaint Weakness    HPI Tanya Osborne is a 60 y.o. female with past medical history of GERD, hypertension, history of duodenal ulcer, here with decreased appetite and generalized weakness.  The patient states that over the last several weeks, she has had progressive worsening generalized weakness.  She had a stomach biopsy several weeks ago, as well as was recently started on sodium bicarb by her nephrologist.  She believes that between these 2 things, she has had poor taste, decreased appetite, and has had progressive worsening generalized weakness.  She has had some lightheadedness with standing.  She presents today for further evaluation.  She does admit that she stopped taking her Protonix as well, as she had read about or heard about side effects from this.  Denies any current complaints other than generalized weakness and poor appetite.  Denies any urinary symptoms.  No changes in bowel habits.  No other acute complaints.    Past Medical History:  Diagnosis Date   Autoimmune liver disease    Cancer ([place])    Depression    Duodenal ulcer perforation ([place]) 09/08/2008   GERD (gastroesophageal reflux disease)    H. 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 Authorizing Provider  feeding supplement (ENSURE ENLIVE / ENSURE PLUS) LIQD Take 237 mLs by mouth 2 (two) times daily between meals. 08/15/20 09/14/20 Yes Duffy Bruce, MD  Cholecalciferol 50 MCG (2000 UT) CAPS Take 2,000 Units by mouth daily.    [provider]  clobetasol ointment (TEMOVATE) 1.61 % Apply 1 application topically 2 (two) times daily. 07/06/19   [provider]  ferrous sulfate 325 (65 FE) MG tablet Take 325 mg by mouth daily with breakfast.    [provider]  lidocaine-prilocaine (EMLA) cream Apply 1 application topically once. 11/28/19   [provider]  Multiple Vitamin (MULTI-VITAMIN) tablet Take 1 tablet by mouth daily.    [provider]  pantoprazole (PROTONIX) 20 MG tablet Take 20 mg by mouth daily. 04/09/20 04/09/21  [provider]    Allergies Nsaids, Penicillins, Celebrex [celecoxib], Cortisone, Gabapentin, Methotrexate derivatives, Nabumetone, and Tomato  Family History  Family history unknown: Yes    Social History    Review of Systems  Review of Systems  Constitutional:  Positive for appetite change and fatigue. Negative for fever.  HENT:  Negative for congestion and sore throat.   Eyes:  Negative for visual disturbance.  Respiratory:  Negative for cough and shortness of breath.   Cardiovascular:  Negative for chest pain.  Gastrointestinal:  Negative for abdominal pain, diarrhea, nausea and vomiting.  Genitourinary:  Negative for flank pain.  Musculoskeletal:  Negative for back pain and neck pain.  Skin:  Negative for rash and wound.  Neurological:  Positive for weakness.  All other systems reviewed and are negative.    ____________________________________________  PHYSICAL EXAM:      VITAL SIGNS: ED Triage Vitals  Enc Vitals Group     BP 08/14/20 1830 125/70     Pulse Rate 08/14/20 1830 94     Resp 08/14/20 1830 16     Temp 08/14/20 1830 98.1 F (36.7 C)     Temp Source 08/14/20 1830 Oral     SpO2 08/14/20 1830 98 %     Weight 08/14/20 1826 156 lb (70.8 kg)     Height 08/14/20 1826 5\' 7"  (1.702 m)     Head Circumference --      Peak Flow --      Pain Score 08/14/20 1826 0     Pain Loc --      Pain Edu? --      Excl. in [place]? --      Physical Exam Vitals and nursing note reviewed.  Constitutional:      General: She is not in acute distress.    Appearance: She is well-developed.  HENT:     Head: Normocephalic and atraumatic.  Eyes:     Conjunctiva/sclera: Conjunctivae normal.  Cardiovascular:     Rate and Rhythm: Normal rate and regular rhythm.     Heart sounds: Normal heart sounds. No murmur heard.   No friction rub.  Pulmonary:     Effort: Pulmonary effort is normal. No respiratory distress.     Breath sounds: Normal breath sounds. No wheezing or rales.  Abdominal:     General: There is no distension.     Palpations: Abdomen is soft.     Tenderness: There is no abdominal tenderness.  Musculoskeletal:     Cervical back: Neck supple.  Skin:    General: Skin is warm.     Capillary Refill: Capillary refill takes less than 2 seconds.  Neurological:     Mental Status: She is alert and oriented to person, place, and time.     Motor: No abnormal muscle tone.      ____________________________________________   LABS (all labs ordered are listed, but only abnormal results are displayed)  Labs Reviewed  URINALYSIS, COMPLETE (UACMP) WITH MICROSCOPIC - Abnormal; Notable for the following components:      Result Value   Color, Urine YELLOW (*)    APPearance CLOUDY (*)    Hgb urine dipstick MODERATE (*)    Ketones, ur 5 (*)    Protein, ur 30 (*)    Leukocytes,Ua TRACE (*)     Bacteria, UA MANY (*)    All other components within normal limits  CBC WITH DIFFERENTIAL/PLATELET - Abnormal; Notable for the following components:   WBC 13.4 (*)    Neutro Abs 10.8 (*)    All other components within normal limits  COMPREHENSIVE METABOLIC PANEL - Abnormal; Notable for the following components:   CO2 21 (*)    BUN 40 (*)    Creatinine, Ser 1.93 (*)    Alkaline Phosphatase 238 (*)    GFR, Estimated 29 (*)    All other components within normal limits  TROPONIN I (HIGH SENSITIVITY)  TROPONIN  I (HIGH SENSITIVITY)    ____________________________________________  EKG:  ________________________________________  RADIOLOGY All imaging, including plain films, CT scans, and ultrasounds, independently reviewed by me, and interpretations confirmed via formal radiology reads.  ED MD interpretation:     Official radiology report(s): No results found.  ____________________________________________  PROCEDURES   Procedure(s) performed (including Critical Care):  Procedures  ____________________________________________  INITIAL IMPRESSION / MDM / [place] / ED COURSE  As part of my medical decision making, I reviewed the following data within the [place] notes reviewed and incorporated, Old chart reviewed, Notes from prior ED visits, and [place] Controlled Substance Database       *Nyonna Hargrove was evaluated in Emergency Department on 08/15/2020 for the symptoms described in the history of present illness. She was evaluated in the context of the global COVID-19 pandemic, which necessitated consideration that the patient might be at risk for infection with the SARS-CoV-2 virus that causes COVID-19. Institutional protocols and algorithms that pertain to the evaluation of patients at risk for COVID-19 are in a state of rapid change based on information released by regulatory bodies including the CDC and federal and state organizations.  These policies and algorithms were followed during the patient's care in the ED.  Some ED evaluations and interventions may be delayed as a result of limited staffing during the pandemic.*     Medical Decision Making: 60 year old female here with generalized weakness.  I suspect this is multifactorial and primarily related to poor caloric intake in the setting of her cancer, recent stomach biopsy with likely gastritis, as well as dental pain limiting calorie intake.  She has been slowly losing weight.  She does appear mildly dehydrated although she has been trying to drink fluids.  She has been able to drink but not wanted to eat many solid foods.  Here, she has a mild leukocytosis which does not appear significantly unchanged from baseline.  She has not had any fevers chills or infectious symptoms.  CMP shows that her renal function is actually a baseline which is reassuring.  Troponin negative x2 and I do not suspect ACS.  EKG is nonischemic.  CBC otherwise unremarkable.   Discussed the importance of increasing her calorie intake and will encourage her to start using Ensure or boost.  Will also have her restart her Protonix as I suspect this is contributing somewhat to her loss of appetite and intermittent abdominal discomfort.  Also refer her to her PCP has a suspect she could benefit significantly from an appetite stimulant such as Remeron or Marinol.  Otherwise, No apparent emergent pathology.  Will discharge with outpatient follow-up.  Of note, UA shows +wbcs, bacteria but 6-10 squamous cells and mucus. Pt is adamant she has had no urinary sx. No flank pain. No fevers. She is not immunosuppressed currently. Will send cx, hold on tx for asymptomatic bacteriuria at this time. ____________________________________________  FINAL CLINICAL IMPRESSION(S) / ED DIAGNOSES  Final diagnoses:  Weakness  Malnutrition, calorie ([place])     MEDICATIONS GIVEN DURING THIS VISIT:  Medications  pantoprazole  (PROTONIX) injection 40 mg (40 mg Intravenous Given 08/15/20 0120)  ondansetron (ZOFRAN) injection 4 mg (4 mg Intravenous Given 08/15/20 0120)  lactated ringers bolus 500 mL (0 mLs Intravenous Stopped 08/15/20 0152)     ED Discharge Orders          Ordered    feeding supplement (ENSURE ENLIVE / ENSURE PLUS) LIQD  2 times daily between meals  08/15/20 0041             Note:  This document was prepared using Dragon voice recognition software and may include unintentional dictation errors.   Duffy Bruce, MD 08/15/20 [phone]

## 2020-08-15 LAB — URINALYSIS, COMPLETE (UACMP) WITH MICROSCOPIC
Bilirubin Urine: NEGATIVE
Glucose, UA: NEGATIVE mg/dL
Ketones, ur: 5 mg/dL — AB
Nitrite: NEGATIVE
Protein, ur: 30 mg/dL — AB
Specific Gravity, Urine: 1.018 (ref 1.005–1.030)
pH: 5 (ref 5.0–8.0)

## 2020-08-15 LAB — TROPONIN I (HIGH SENSITIVITY): Troponin I (High Sensitivity): 10 ng/L (ref <18)

## 2020-08-15 MED ORDER — ENSURE ENLIVE PO LIQD: 1.0000 | Freq: Two times a day (BID) | ORAL | 0 refills | Status: AC

## 2020-08-15 MED ORDER — PANTOPRAZOLE SODIUM 40 MG IV SOLR
40.0000 mg | Freq: Once | INTRAVENOUS | Status: AC
  Administered 2020-08-15: 40 mg via INTRAVENOUS
  Filled 2020-08-15: qty 40

## 2020-08-15 MED ORDER — LACTATED RINGERS IV BOLUS
500.0000 mL | Freq: Once | INTRAVENOUS | Status: AC
  Administered 2020-08-15: 500 mL via INTRAVENOUS

## 2020-08-15 MED ORDER — ONDANSETRON HCL 4 MG/2ML IJ SOLN
4.0000 mg | Freq: Once | INTRAMUSCULAR | Status: AC
  Administered 2020-08-15: 4 mg via INTRAVENOUS
  Filled 2020-08-15: qty 2

## 2020-08-15 NOTE — Discharge Instructions (Addendum)
START taking the Protonix 20 mg daily in the mornings  START taking an over-the-counter calorie supplements. I would recommend ENSURE/ENSURE COMPLETE. You can also try BOOST. This is safe for your degree of kidney disease and will help with energy.  I'd start with one shake daily, increase to twice a day as needed  Continue drinking the baking soda with water  Smart water or any other pure water is safe and recommended. You are OK to drink alkalinized water, but if you're taking the baking soda as well, this is not necessary.

## 2020-08-17 LAB — URINE CULTURE: Culture: <10000 — AB

## 2020-10-21 ENCOUNTER — Emergency Department: Payer: Medicare Other

## 2020-10-21 ENCOUNTER — Encounter
Admission: EM | Disposition: A | Discharge status: Home Health | Payer: Self-pay | Source: Home / Self Care | Attending: Internal Medicine

## 2020-10-21 ENCOUNTER — Inpatient Hospital Stay
Admission: EM | Admit: 2020-10-21 | Discharge: 2020-10-29 | DRG: 872 | Disposition: A | Discharge status: Home Health | Payer: Medicare Other | Attending: Internal Medicine | Admitting: Internal Medicine

## 2020-10-21 ENCOUNTER — Inpatient Hospital Stay: Payer: Medicare Other | Admitting: Anesthesiology

## 2020-10-21 ENCOUNTER — Other Ambulatory Visit: Payer: Self-pay

## 2020-10-21 DIAGNOSIS — N183 Chronic kidney disease, stage 3 unspecified: Secondary | ICD-10-CM | POA: Diagnosis present

## 2020-10-21 DIAGNOSIS — E7800 Pure hypercholesterolemia, unspecified: Secondary | ICD-10-CM | POA: Diagnosis present

## 2020-10-21 DIAGNOSIS — D849 Immunodeficiency, unspecified: Secondary | ICD-10-CM | POA: Diagnosis present

## 2020-10-21 DIAGNOSIS — K611 Rectal abscess: Secondary | ICD-10-CM

## 2020-10-21 DIAGNOSIS — Z7952 Long term (current) use of systemic steroids: Secondary | ICD-10-CM

## 2020-10-21 DIAGNOSIS — Z8711 Personal history of peptic ulcer disease: Secondary | ICD-10-CM

## 2020-10-21 DIAGNOSIS — I129 Hypertensive chronic kidney disease with stage 1 through stage 4 chronic kidney disease, or unspecified chronic kidney disease: Secondary | ICD-10-CM | POA: Diagnosis present

## 2020-10-21 DIAGNOSIS — K769 Liver disease, unspecified: Secondary | ICD-10-CM | POA: Diagnosis present

## 2020-10-21 DIAGNOSIS — I10 Essential (primary) hypertension: Secondary | ICD-10-CM

## 2020-10-21 DIAGNOSIS — Z932 Ileostomy status: Secondary | ICD-10-CM | POA: Diagnosis not present

## 2020-10-21 DIAGNOSIS — Z85038 Personal history of other malignant neoplasm of large intestine: Secondary | ICD-10-CM | POA: Diagnosis present

## 2020-10-21 DIAGNOSIS — N1832 Chronic kidney disease, stage 3b: Secondary | ICD-10-CM

## 2020-10-21 DIAGNOSIS — B966 Bacteroides fragilis [B. fragilis] as the cause of diseases classified elsewhere: Secondary | ICD-10-CM | POA: Diagnosis present

## 2020-10-21 DIAGNOSIS — K219 Gastro-esophageal reflux disease without esophagitis: Secondary | ICD-10-CM | POA: Diagnosis present

## 2020-10-21 DIAGNOSIS — Z9049 Acquired absence of other specified parts of digestive tract: Secondary | ICD-10-CM

## 2020-10-21 DIAGNOSIS — N184 Chronic kidney disease, stage 4 (severe): Secondary | ICD-10-CM | POA: Diagnosis present

## 2020-10-21 DIAGNOSIS — N1831 Chronic kidney disease, stage 3a: Secondary | ICD-10-CM | POA: Diagnosis present

## 2020-10-21 DIAGNOSIS — Z888 Allergy status to other drugs, medicaments and biological substances status: Secondary | ICD-10-CM

## 2020-10-21 DIAGNOSIS — E162 Hypoglycemia, unspecified: Secondary | ICD-10-CM | POA: Diagnosis not present

## 2020-10-21 DIAGNOSIS — M069 Rheumatoid arthritis, unspecified: Secondary | ICD-10-CM | POA: Diagnosis present

## 2020-10-21 DIAGNOSIS — A419 Sepsis, unspecified organism: Secondary | ICD-10-CM | POA: Diagnosis present

## 2020-10-21 DIAGNOSIS — E872 Acidosis, unspecified: Secondary | ICD-10-CM | POA: Diagnosis present

## 2020-10-21 DIAGNOSIS — Z6825 Body mass index (BMI) 25.0-25.9, adult: Secondary | ICD-10-CM | POA: Diagnosis not present

## 2020-10-21 DIAGNOSIS — K8301 Primary sclerosing cholangitis: Secondary | ICD-10-CM | POA: Diagnosis present

## 2020-10-21 DIAGNOSIS — G629 Polyneuropathy, unspecified: Secondary | ICD-10-CM | POA: Diagnosis present

## 2020-10-21 DIAGNOSIS — D509 Iron deficiency anemia, unspecified: Secondary | ICD-10-CM | POA: Diagnosis present

## 2020-10-21 DIAGNOSIS — Z20822 Contact with and (suspected) exposure to covid-19: Secondary | ICD-10-CM | POA: Diagnosis present

## 2020-10-21 DIAGNOSIS — Z880 Allergy status to penicillin: Secondary | ICD-10-CM

## 2020-10-21 DIAGNOSIS — R6520 Severe sepsis without septic shock: Secondary | ICD-10-CM | POA: Diagnosis present

## 2020-10-21 DIAGNOSIS — E860 Dehydration: Secondary | ICD-10-CM | POA: Diagnosis present

## 2020-10-21 DIAGNOSIS — E440 Moderate protein-calorie malnutrition: Secondary | ICD-10-CM | POA: Diagnosis present

## 2020-10-21 DIAGNOSIS — Z91018 Allergy to other foods: Secondary | ICD-10-CM

## 2020-10-21 DIAGNOSIS — Z8249 Family history of ischemic heart disease and other diseases of the circulatory system: Secondary | ICD-10-CM

## 2020-10-21 DIAGNOSIS — L0231 Cutaneous abscess of buttock: Secondary | ICD-10-CM | POA: Diagnosis present

## 2020-10-21 DIAGNOSIS — C189 Malignant neoplasm of colon, unspecified: Secondary | ICD-10-CM | POA: Diagnosis present

## 2020-10-21 DIAGNOSIS — Z886 Allergy status to analgesic agent status: Secondary | ICD-10-CM

## 2020-10-21 DIAGNOSIS — Z79899 Other long term (current) drug therapy: Secondary | ICD-10-CM

## 2020-10-21 DIAGNOSIS — R54 Age-related physical debility: Secondary | ICD-10-CM | POA: Diagnosis present

## 2020-10-21 DIAGNOSIS — R531 Weakness: Secondary | ICD-10-CM

## 2020-10-21 DIAGNOSIS — R5381 Other malaise: Secondary | ICD-10-CM

## 2020-10-21 HISTORY — PX: INCISION AND DRAINAGE ABSCESS: SHX5864

## 2020-10-21 LAB — GLUCOSE, CAPILLARY
Glucose-Capillary: 59 mg/dL — ABNORMAL LOW (ref 70–99)
Glucose-Capillary: 93 mg/dL (ref 70–99)

## 2020-10-21 LAB — URINALYSIS, COMPLETE (UACMP) WITH MICROSCOPIC
Bilirubin Urine: NEGATIVE
Glucose, UA: NEGATIVE mg/dL
Ketones, ur: NEGATIVE mg/dL
Leukocytes,Ua: NEGATIVE
Nitrite: NEGATIVE
Protein, ur: 30 mg/dL — AB
Specific Gravity, Urine: 1.013 (ref 1.005–1.030)
pH: 5 (ref 5.0–8.0)

## 2020-10-21 LAB — BASIC METABOLIC PANEL
Anion gap: 8 (ref 5–15)
Anion gap: 11 (ref 5–15)
BUN: 17 mg/dL (ref 8–23)
BUN: 17 mg/dL (ref 8–23)
CO2: 21 mmol/L — ABNORMAL LOW (ref 22–32)
CO2: 17 mmol/L — ABNORMAL LOW (ref 22–32)
Calcium: 8.7 mg/dL — ABNORMAL LOW (ref 8.9–10.3)
Calcium: 8.1 mg/dL — ABNORMAL LOW (ref 8.9–10.3)
Chloride: 112 mmol/L — ABNORMAL HIGH (ref 98–111)
Chloride: 109 mmol/L (ref 98–111)
Creatinine, Ser: 1.6 mg/dL — ABNORMAL HIGH (ref 0.44–1.00)
Creatinine, Ser: 1.5 mg/dL — ABNORMAL HIGH (ref 0.44–1.00)
GFR, Estimated: 36 mL/min — ABNORMAL LOW (ref >60)
GFR, Estimated: 39 mL/min — ABNORMAL LOW (ref >60)
Glucose, Bld: 90 mg/dL (ref 70–99)
Glucose, Bld: 130 mg/dL — ABNORMAL HIGH (ref 70–99)
Potassium: 3.9 mmol/L (ref 3.5–5.1)
Potassium: 3.8 mmol/L (ref 3.5–5.1)
Sodium: 141 mmol/L (ref 135–145)
Sodium: 137 mmol/L (ref 135–145)

## 2020-10-21 LAB — RESP PANEL BY RT-PCR (FLU A&B, COVID) ARPGX2
Influenza A by PCR: NEGATIVE
Influenza B by PCR: NEGATIVE
SARS Coronavirus 2 by RT PCR: NEGATIVE

## 2020-10-21 LAB — CBC
HCT: 31.9 % — ABNORMAL LOW (ref 36.0–46.0)
HCT: 30 % — ABNORMAL LOW (ref 36.0–46.0)
Hemoglobin: 10.9 g/dL — ABNORMAL LOW (ref 12.0–15.0)
Hemoglobin: 10 g/dL — ABNORMAL LOW (ref 12.0–15.0)
MCH: 32.2 pg (ref 26.0–34.0)
MCH: 31.5 pg (ref 26.0–34.0)
MCHC: 34.2 g/dL (ref 30.0–36.0)
MCHC: 33.3 g/dL (ref 30.0–36.0)
MCV: 94.1 fL (ref 80.0–100.0)
MCV: 94.6 fL (ref 80.0–100.0)
Platelets: 167 10*3/uL (ref 150–400)
Platelets: 147 10*3/uL — ABNORMAL LOW (ref 150–400)
RBC: 3.39 MIL/uL — ABNORMAL LOW (ref 3.87–5.11)
RBC: 3.17 MIL/uL — ABNORMAL LOW (ref 3.87–5.11)
RDW: 17.8 % — ABNORMAL HIGH (ref 11.5–15.5)
RDW: 17.8 % — ABNORMAL HIGH (ref 11.5–15.5)
WBC: 31.5 10*3/uL — ABNORMAL HIGH (ref 4.0–10.5)
WBC: 26.6 10*3/uL — ABNORMAL HIGH (ref 4.0–10.5)
nRBC: 0 % (ref 0.0–0.2)
nRBC: 0 % (ref 0.0–0.2)

## 2020-10-21 LAB — LACTIC ACID, PLASMA
Lactic Acid, Venous: 1.2 mmol/L (ref 0.5–1.9)
Lactic Acid, Venous: 3.7 mmol/L (ref 0.5–1.9)
Lactic Acid, Venous: 5.4 mmol/L (ref 0.5–1.9)

## 2020-10-21 LAB — MRSA NEXT GEN BY PCR, NASAL: MRSA by PCR Next Gen: NOT DETECTED

## 2020-10-21 LAB — MAGNESIUM: Magnesium: 1.4 mg/dL — ABNORMAL LOW (ref 1.7–2.4)

## 2020-10-21 LAB — PROTIME-INR
INR: 1.2 (ref 0.8–1.2)
Prothrombin Time: 15.2 seconds (ref 11.4–15.2)

## 2020-10-21 LAB — SEDIMENTATION RATE: Sed Rate: 69 mm/hr — ABNORMAL HIGH (ref 0–30)

## 2020-10-21 LAB — BRAIN NATRIURETIC PEPTIDE: B Natriuretic Peptide: 255.7 pg/mL — ABNORMAL HIGH (ref 0.0–100.0)

## 2020-10-21 LAB — PROCALCITONIN: Procalcitonin: 3.1 ng/mL

## 2020-10-21 SURGERY — INCISION AND DRAINAGE, ABSCESS
Anesthesia: General | Laterality: Left

## 2020-10-21 MED ORDER — FENTANYL CITRATE (PF) 100 MCG/2ML IJ SOLN
INTRAMUSCULAR | Status: AC
  Filled 2020-10-21: qty 2

## 2020-10-21 MED ORDER — BUPIVACAINE-EPINEPHRINE (PF) 0.25% -1:200000 IJ SOLN
INTRAMUSCULAR | Status: AC
  Filled 2020-10-21: qty 30

## 2020-10-21 MED ORDER — LACTATED RINGERS IV BOLUS: 500.0000 mL | Freq: Once | INTRAVENOUS | Status: DC

## 2020-10-21 MED ORDER — PROPOFOL 10 MG/ML IV BOLUS
INTRAVENOUS | Status: AC
  Filled 2020-10-21: qty 20

## 2020-10-21 MED ORDER — FENTANYL CITRATE (PF) 100 MCG/2ML IJ SOLN
INTRAMUSCULAR | Status: DC | PRN
  Administered 2020-10-21 (×2): 50 ug via INTRAVENOUS

## 2020-10-21 MED ORDER — DEXTROSE 50 % IV SOLN
12.5000 g | INTRAVENOUS | Status: AC
  Administered 2020-10-21: 12.5 g via INTRAVENOUS

## 2020-10-21 MED ORDER — SODIUM CHLORIDE 0.9 % IV SOLN
1.0000 g | INTRAVENOUS | Status: DC
  Administered 2020-10-22 – 2020-10-26 (×5): 1 g via INTRAVENOUS
  Filled 2020-10-21: qty 1
  Filled 2020-10-21: qty 10
  Filled 2020-10-21 (×2): qty 1
  Filled 2020-10-21 (×2): qty 10

## 2020-10-21 MED ORDER — LIDOCAINE HCL (CARDIAC) PF 100 MG/5ML IV SOSY
PREFILLED_SYRINGE | INTRAVENOUS | Status: DC | PRN
  Administered 2020-10-21: 80 mg via INTRAVENOUS

## 2020-10-21 MED ORDER — HYDROMORPHONE HCL 1 MG/ML IJ SOLN
1.0000 mg | Freq: Once | INTRAMUSCULAR | Status: AC
  Administered 2020-10-21: 1 mg via INTRAVENOUS
  Filled 2020-10-21: qty 1

## 2020-10-21 MED ORDER — OXYCODONE HCL 5 MG/5ML PO SOLN
5.0000 mg | Freq: Once | ORAL | Status: DC | PRN
Start: 2020-10-21 — End: 2020-10-21

## 2020-10-21 MED ORDER — OXYCODONE-ACETAMINOPHEN 5-325 MG PO TABS
1.0000 | ORAL_TABLET | ORAL | Status: DC | PRN
  Administered 2020-10-22 – 2020-10-23 (×6): 1 via ORAL
  Filled 2020-10-21 (×6): qty 1

## 2020-10-21 MED ORDER — PREDNISONE 20 MG PO TABS
50.0000 mg | ORAL_TABLET | Freq: Every day | ORAL | Status: DC
  Filled 2020-10-21: qty 1

## 2020-10-21 MED ORDER — LACTATED RINGERS IV BOLUS
1000.0000 mL | Freq: Once | INTRAVENOUS | Status: AC
  Administered 2020-10-21: 1000 mL via INTRAVENOUS

## 2020-10-21 MED ORDER — PANTOPRAZOLE SODIUM 20 MG PO TBEC
20.0000 mg | DELAYED_RELEASE_TABLET | Freq: Every day | ORAL | Status: DC
  Administered 2020-10-22: 20 mg via ORAL
  Filled 2020-10-21 (×2): qty 1

## 2020-10-21 MED ORDER — CALCIUM GLUCONATE-NACL 1-0.675 GM/50ML-% IV SOLN
1.0000 g | Freq: Once | INTRAVENOUS | Status: AC
  Administered 2020-10-21: 1000 mg via INTRAVENOUS
  Filled 2020-10-21: qty 50

## 2020-10-21 MED ORDER — HYDRALAZINE HCL 20 MG/ML IJ SOLN
5.0000 mg | INTRAMUSCULAR | Status: DC | PRN
  Filled 2020-10-21: qty 0.25

## 2020-10-21 MED ORDER — VANCOMYCIN HCL 750 MG/150ML IV SOLN
750.0000 mg | INTRAVENOUS | Status: DC
  Filled 2020-10-21: qty 150

## 2020-10-21 MED ORDER — VANCOMYCIN HCL 1500 MG/300ML IV SOLN
1500.0000 mg | Freq: Once | INTRAVENOUS | Status: AC
  Administered 2020-10-21: 1500 mg via INTRAVENOUS
  Filled 2020-10-21: qty 300

## 2020-10-21 MED ORDER — CHLORHEXIDINE GLUCONATE CLOTH 2 % EX PADS
6.0000 | MEDICATED_PAD | Freq: Every day | CUTANEOUS | Status: DC
  Administered 2020-10-21 – 2020-10-29 (×7): 6 via TOPICAL

## 2020-10-21 MED ORDER — ONDANSETRON HCL 4 MG/2ML IJ SOLN
4.0000 mg | Freq: Three times a day (TID) | INTRAMUSCULAR | Status: DC | PRN
  Administered 2020-10-22: 4 mg via INTRAVENOUS
  Filled 2020-10-21: qty 2

## 2020-10-21 MED ORDER — ONDANSETRON HCL 4 MG/2ML IJ SOLN
INTRAMUSCULAR | Status: DC | PRN
  Administered 2020-10-21: 4 mg via INTRAVENOUS

## 2020-10-21 MED ORDER — LACTATED RINGERS IV SOLN: INTRAVENOUS | Status: DC

## 2020-10-21 MED ORDER — METRONIDAZOLE 500 MG/100ML IV SOLN
500.0000 mg | Freq: Two times a day (BID) | INTRAVENOUS | Status: DC
  Administered 2020-10-22 – 2020-10-26 (×9): 500 mg via INTRAVENOUS
  Filled 2020-10-21 (×10): qty 100

## 2020-10-21 MED ORDER — DEXTROSE 50 % IV SOLN
INTRAVENOUS | Status: AC
  Administered 2020-10-22: 50 mL
  Filled 2020-10-21: qty 50

## 2020-10-21 MED ORDER — SUCCINYLCHOLINE CHLORIDE 200 MG/10ML IV SOSY
PREFILLED_SYRINGE | INTRAVENOUS | Status: DC | PRN
Start: 2020-10-21 — End: 2020-10-21
  Administered 2020-10-21: 120 mg via INTRAVENOUS

## 2020-10-21 MED ORDER — ACETAMINOPHEN 325 MG PO TABS: 650.0000 mg | ORAL_TABLET | Freq: Four times a day (QID) | ORAL | Status: DC | PRN

## 2020-10-21 MED ORDER — SODIUM CHLORIDE 0.9 % IV BOLUS
1000.0000 mL | Freq: Once | INTRAVENOUS | Status: AC
  Administered 2020-10-21: 1000 mL via INTRAVENOUS

## 2020-10-21 MED ORDER — MORPHINE SULFATE (PF) 2 MG/ML IV SOLN
2.0000 mg | INTRAVENOUS | Status: DC | PRN
  Administered 2020-10-21 – 2020-10-29 (×26): 2 mg via INTRAVENOUS
  Filled 2020-10-21 (×26): qty 1

## 2020-10-21 MED ORDER — VITAMIN D 25 MCG (1000 UNIT) PO TABS
2000.0000 [IU] | ORAL_TABLET | Freq: Every day | ORAL | Status: DC
  Administered 2020-10-22 – 2020-10-29 (×8): 2000 [IU] via ORAL
  Filled 2020-10-21 (×8): qty 2

## 2020-10-21 MED ORDER — FENTANYL CITRATE (PF) 100 MCG/2ML IJ SOLN: 25.0000 ug | INTRAMUSCULAR | Status: DC | PRN

## 2020-10-21 MED ORDER — PROPOFOL 10 MG/ML IV BOLUS
INTRAVENOUS | Status: DC | PRN
Start: 2020-10-21 — End: 2020-10-21
  Administered 2020-10-21: 100 mg via INTRAVENOUS

## 2020-10-21 MED ORDER — PHENYLEPHRINE HCL (PRESSORS) 10 MG/ML IV SOLN
INTRAVENOUS | Status: DC | PRN
  Administered 2020-10-21 (×2): 100 ug via INTRAVENOUS

## 2020-10-21 MED ORDER — IOHEXOL 350 MG/ML SOLN
80.0000 mL | Freq: Once | INTRAVENOUS | Status: AC | PRN
  Administered 2020-10-21: 60 mL via INTRAVENOUS

## 2020-10-21 MED ORDER — BUPIVACAINE-EPINEPHRINE 0.25% -1:200000 IJ SOLN
INTRAMUSCULAR | Status: DC | PRN
  Administered 2020-10-21: 30 mL

## 2020-10-21 MED ORDER — OMEGA-3-ACID ETHYL ESTERS 1 G PO CAPS
1.0000 | ORAL_CAPSULE | Freq: Every day | ORAL | Status: DC
  Administered 2020-10-22 – 2020-10-29 (×8): 1 g via ORAL
  Filled 2020-10-21 (×8): qty 1

## 2020-10-21 MED ORDER — OXYCODONE HCL 5 MG PO TABS
5.0000 mg | ORAL_TABLET | Freq: Once | ORAL | Status: DC | PRN
Start: 2020-10-21 — End: 2020-10-21

## 2020-10-21 MED ORDER — LACTATED RINGERS IV SOLN: INTRAVENOUS | Status: DC | PRN

## 2020-10-21 MED ORDER — PREDNISONE 20 MG PO TABS
20.0000 mg | ORAL_TABLET | Freq: Every day | ORAL | Status: DC
  Filled 2020-10-21: qty 1

## 2020-10-21 MED ORDER — SODIUM CHLORIDE 0.9 % IV SOLN
1.0000 g | Freq: Once | INTRAVENOUS | Status: AC
  Administered 2020-10-21: 1 g via INTRAVENOUS
  Filled 2020-10-21: qty 10

## 2020-10-21 MED ORDER — MIDAZOLAM HCL 2 MG/2ML IJ SOLN
INTRAMUSCULAR | Status: AC
  Filled 2020-10-21: qty 2

## 2020-10-21 MED ORDER — LACTATED RINGERS IV BOLUS (SEPSIS)
1000.0000 mL | Freq: Once | INTRAVENOUS | Status: AC
  Administered 2020-10-21: 1000 mL via INTRAVENOUS

## 2020-10-21 MED ORDER — MAGNESIUM SULFATE 2 GM/50ML IV SOLN
2.0000 g | Freq: Once | INTRAVENOUS | Status: AC
  Administered 2020-10-21: 2 g via INTRAVENOUS
  Filled 2020-10-21: qty 50

## 2020-10-21 SURGICAL SUPPLY — 27 items
BLADE SURG 15 STRL LF DISP TIS (BLADE) ×1 IMPLANT
BLADE SURG 15 STRL SS (BLADE) ×1
DRAPE LAPAROTOMY 77X122 PED (DRAPES) ×2 IMPLANT
DRAPE LEGGINS SURG 28X43 STRL (DRAPES) ×2 IMPLANT
ELECT CAUTERY BLADE 6.4 (BLADE) ×2 IMPLANT
ELECT REM PT RETURN 9FT ADLT (ELECTROSURGICAL) ×2
ELECTRODE REM PT RTRN 9FT ADLT (ELECTROSURGICAL) ×1 IMPLANT
GAUZE 4X4 16PLY ~~LOC~~+RFID DBL (SPONGE) ×2 IMPLANT
GAUZE PACK 2X3YD (PACKING) ×2 IMPLANT
GAUZE PACKING 1/2X5YD (GAUZE/BANDAGES/DRESSINGS) ×2 IMPLANT
GAUZE SPONGE 4X4 12PLY STRL (GAUZE/BANDAGES/DRESSINGS) ×2 IMPLANT
GLOVE SURG ORTHO LTX SZ7.5 (GLOVE) ×2 IMPLANT
GOWN STRL REUS W/ TWL LRG LVL3 (GOWN DISPOSABLE) ×2 IMPLANT
GOWN STRL REUS W/TWL LRG LVL3 (GOWN DISPOSABLE) ×2
KIT TURNOVER KIT A (KITS) ×2 IMPLANT
MANIFOLD NEPTUNE II (INSTRUMENTS) ×2 IMPLANT
NEEDLE HYPO 22GX1.5 SAFETY (NEEDLE) ×2 IMPLANT
NS IRRIG 1000ML POUR BTL (IV SOLUTION) ×2 IMPLANT
PACK BASIN MINOR ARMC (MISCELLANEOUS) ×2 IMPLANT
PAD ABD DERMACEA PRESS 5X9 (GAUZE/BANDAGES/DRESSINGS) ×4 IMPLANT
SOL PREP PVP 2OZ (MISCELLANEOUS) ×2
SOLUTION PREP PVP 2OZ (MISCELLANEOUS) ×1 IMPLANT
SPONGE T-LAP 18X18 ~~LOC~~+RFID (SPONGE) ×2 IMPLANT
SWAB CULTURE AMIES ANAERIB BLU (MISCELLANEOUS) ×2 IMPLANT
SYR 10ML LL (SYRINGE) ×2 IMPLANT
SYR BULB IRRIG 60ML STRL (SYRINGE) ×2 IMPLANT
WATER STERILE IRR 500ML POUR (IV SOLUTION) ×2 IMPLANT

## 2020-10-21 NOTE — ED Notes (Signed)
Surgical team here to transport pt to OR for Sx.

## 2020-10-21 NOTE — ED Notes (Signed)
Roni RN aware of assigned bed

## 2020-10-21 NOTE — Consult Note (Signed)
Pharmacy Antibiotic Note  Tanya Osborne is a 60 y.o. female with PMH of autoimmune liver disease, depression, GERD, CKD, and RA who was admitted on 10/21/2020 with  left gluteal abscess .  Pharmacy has been consulted for vancomycin dosing.  Afeb, WBC 31.5, procal 3.10  Bcx sent  Plan: Vancomycin  -Pt received vancomycin 1500mg  IV LD in ED -Will start vancomycin 750mg  IV q24h    -Est AUC 468    -Css min 13.1    -Scr used 1.6    -Wt used IBW    -Vd 0.72 -Will obtain levels around 4th or 5th dose if vancomycin continued   Will continue to monitor renal function and adjust dose as clinically indicated  Height: 5\' 7"  (170.2 cm) Weight: 65.8 kg (145 lb) IBW/kg (Calculated) : 61.6  Temp (24hrs), Avg:99.2 F (37.3 C), Min:99.2 F (37.3 C), Max:99.2 F (37.3 C)  Recent Labs  Lab 10/21/20 1430  WBC 31.5*  CREATININE 1.60*  LATICACIDVEN 1.2    Estimated Creatinine Clearance: 35.5 mL/min (A) (by C-G formula based on SCr of 1.6 mg/dL (H)).    Allergies  Allergen Reactions   Nsaids Swelling and Rash    Other Reaction: OTHER REACTION-GI BLEED   Penicillins Swelling   Celebrex [Celecoxib] Swelling   Cortisone Swelling   Gabapentin Swelling   Methotrexate Derivatives Swelling   Nabumetone Other (See Comments)   Tomato Rash    Joint inflamation    Antimicrobials this admission: Vancomycin 10/11 >>  Ceftriaxone 10/12 >>   Microbiology results: 10/11 BCx: sent  Thank you for allowing pharmacy to be a part of this patient's care.  Narda Rutherford, PharmD Pharmacy Resident  10/21/2020 5:08 PM

## 2020-10-21 NOTE — Anesthesia Procedure Notes (Signed)
Procedure Name: Intubation Date/Time: 10/21/2020 6:23 PM Performed by: Lerry Liner, CRNA Pre-anesthesia Checklist: Patient identified, Emergency Drugs available, Suction available and Patient being monitored Patient Re-evaluated:Patient Re-evaluated prior to induction Oxygen Delivery Method: Circle system utilized Preoxygenation: Pre-oxygenation with 100% oxygen Induction Type: IV induction Ventilation: Mask ventilation without difficulty Laryngoscope Size: McGraph and 3 Grade View: Grade I Tube type: Oral Tube size: 7.0 mm Number of attempts: 1 Airway Equipment and Method: Stylet and Oral airway Placement Confirmation: ETT inserted through vocal cords under direct vision, positive ETCO2 and breath sounds checked- equal and bilateral Secured at: 21 cm Tube secured with: Tape Dental Injury: Teeth and Oropharynx as per pre-operative assessment

## 2020-10-21 NOTE — Consult Note (Signed)
CODE SEPSIS - PHARMACY COMMUNICATION  **Broad Spectrum Antibiotics should be administered within 1 hour of Sepsis diagnosis**  Time Code Sepsis Called/Page Received: 9090  Antibiotics Ordered: ceftriaxone and vancomycin   Time of 1st antibiotic administration: [place], PharmD Pharmacy Resident  10/21/2020 2:45 PM

## 2020-10-21 NOTE — ED Triage Notes (Signed)
Pt to ED for abscess to buttocks and weakness after chemo. States she is not usually this weak after chemo, last treatment 2 weeks ago. States feels dehydrated NAD noted

## 2020-10-21 NOTE — ED Notes (Signed)
Report to [place], RN

## 2020-10-21 NOTE — H&P (View-Only) (Signed)
[facility] SURGICAL CONSULTATION NOTE (initial) - cpt: 99244   HISTORY OF PRESENT ILLNESS (HPI):  60 y.o. female presented to [facility] ED today for evaluation of gluteal abscess. Patient known to our service following a history of colon cancer s/p colectomy and end ileostomy at [facility] in February of 2020. She was admitted to [facility] in April for SBO which resolved with conservative management. Still undergoing chemotherapy at [facility] but has been having increasing neuropathy and weakness. Patient's daughter reports that on Thursday of last week she started to complain of pain in her left gluteal cheek. She thought initially this may have been a "boil" but the swelling and pain progressively got worse. She is unable to move well without having pain. No fevers at home. No drainage from the area. Work up in the ED was concerning for leukocytosis to 31K but her labs are otherwise reassuring. CT Pelvis and her physical examination are both concerning for left gluteal abscess.   Surgery is consulted by emergency medicine physician Dr. Vladimir Crofts, MD in this context for evaluation and management of left gluteal abscess.   PAST MEDICAL HISTORY (PMH):  Past Medical History:  Diagnosis Date   Autoimmune liver disease    Cancer ([place])    Depression    Duodenal ulcer perforation ([place]) 09/08/2008   GERD (gastroesophageal reflux disease)    H. 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 Authorizing  Provider  Cholecalciferol 50 MCG (2000 UT) CAPS Take 2,000 Units by mouth daily.    [provider]  clobetasol ointment (TEMOVATE) 2.70 % Apply 1 application topically 2 (two) times daily. 07/06/19   [provider]  ferrous sulfate 325 (65 FE) MG tablet Take 325 mg by mouth daily with breakfast.    [provider]  lidocaine-prilocaine (EMLA) cream Apply 1 application topically once. 11/28/19   [provider]  Multiple Vitamin (MULTI-VITAMIN) tablet Take 1 tablet by mouth daily.    [provider]  pantoprazole (PROTONIX) 20 MG tablet Take 20 mg by mouth daily. 04/09/20 04/09/21  [provider]     ALLERGIES:  Allergies  Allergen Reactions   Nsaids Swelling and Rash    Other Reaction: OTHER REACTION-GI BLEED   Penicillins Swelling   Celebrex [Celecoxib] Swelling   Cortisone Swelling   Gabapentin Swelling   Methotrexate Derivatives Swelling   Nabumetone Other (See Comments)   Tomato Rash    Joint inflamation     SOCIAL HISTORY:  Social History   Socioeconomic History   Marital status: Divorced    Spouse name: Not on file   Number of children: Not on file   Years of education: Not on file   Highest education level: Not on file  Occupational History   Not on file  Tobacco Use   Smoking status: Not on file   Smokeless tobacco: Not on file  Substance and Sexual Activity   Alcohol use: Not on file  Drug use: Not on file   Sexual activity: Not on file  Other Topics Concern   Not on file  Social History Narrative   Not on file   Social Determinants of Health   Financial Resource Strain: Not on file  Food Insecurity: Not on file  Transportation Needs: Not on file  Physical Activity: Not on file  Stress: Not on file  Social Connections: Not on file  Intimate Partner Violence: Not on file     FAMILY HISTORY:  Family History  Family history unknown: Yes      REVIEW OF SYSTEMS:  Review of Systems   Constitutional:  Negative for chills and fever.  Respiratory:  Negative for cough and shortness of breath.   Cardiovascular:  Negative for chest pain and palpitations.  Gastrointestinal:  Negative for abdominal pain, nausea and vomiting.  Genitourinary:  Negative for dysuria and urgency.  Skin:        + Left Gluteal Abscess  Neurological:  Positive for weakness. Negative for dizziness.  All other systems reviewed and are negative.  VITAL SIGNS:  Temp:  [99.2 F (37.3 C)] 99.2 F (37.3 C) (10/11 1043) Pulse Rate:  [115-128] 115 (10/11 1536) Resp:  [15-18] 15 (10/11 1536) BP: (135-143)/(80-86) 143/80 (10/11 1536) SpO2:  [100 %] 100 % (10/11 1536) Weight:  [65.8 kg] 65.8 kg (10/11 1044)     Height: 5\' 7"  (170.2 cm) Weight: 65.8 kg BMI (Calculated): 22.71   INTAKE/OUTPUT:  No intake/output data recorded.  PHYSICAL EXAM:  Physical Exam Vitals and nursing note reviewed. Exam conducted with a chaperone present.  Constitutional:      General: She is not in acute distress.    Appearance: Normal appearance. She is not ill-appearing.     Comments: Patient resting in bed, NAD, daughter at bedside   HENT:     Head: Normocephalic and atraumatic.  Eyes:     General: No scleral icterus.    Conjunctiva/sclera: Conjunctivae normal.  Cardiovascular:     Rate and Rhythm: Regular rhythm. Tachycardia present.     Pulses: Normal pulses.     Heart sounds: No murmur heard. Pulmonary:     Effort: Pulmonary effort is normal. No respiratory distress.     Comments: On [place] Chest:     Comments: Port in right chest wall Abdominal:     General: The ostomy site is clean. There is no distension.     Palpations: Abdomen is soft.     Tenderness: There is no abdominal tenderness.     Comments: End ileostomy present  Genitourinary:   Skin:    General: Skin is warm and dry.     Coloration: Skin is not pale.     Findings: No erythema.  Neurological:     General: No focal deficit present.     Mental  Status: She is alert and oriented to person, place, and time.  Psychiatric:        Mood and Affect: Mood normal.        Behavior: Behavior normal.     Labs:  CBC Latest Ref Rng & Units 10/21/2020 08/14/2020 05/01/2020  WBC 4.0 - 10.5 K/uL 31.5(H) 13.4(H) 6.8  Hemoglobin 12.0 - 15.0 g/dL 10.9(L) 13.3 10.2(L)  Hematocrit 36.0 - 46.0 % 31.9(L) 41.4 31.9(L)  Platelets 150 - 400 K/uL 167 186 145(L)   CMP Latest Ref Rng & Units 10/21/2020 08/14/2020 05/01/2020  Glucose 70 - 99 mg/dL 90 99 75  BUN 8 - 23 mg/dL 17 40(H) 33(H)  Creatinine 0.44 - 1.00 mg/dL 1.60(H) 1.93(H) 2.03(H)  Sodium 135 - 145 mmol/L 141 136 141  Potassium 3.5 - 5.1 mmol/L 3.9 4.3 4.0  Chloride 98 - 111 mmol/L 112(H) 108 116(H)  CO2 22 - 32 mmol/L 21(L) 21(L) 17(L)  Calcium 8.9 - 10.3 mg/dL 8.7(L) 9.4 8.7(L)  Total Protein 6.5 - 8.1 g/dL - 7.2 -  Total Bilirubin 0.3 - 1.2 mg/dL - 1.0 -  Alkaline Phos 38 - 126 U/L - 238(H) -  AST 15 - 41 U/L - 26 -  ALT 0 - 44 U/L - 33 -     Imaging studies:   CT Pelvis (10/21/2020) personally reviewed showing and area of fluid concerning for left gluteal abscess with surrounding stranding, and radiologist report reviewed:  IMPRESSION: 1. 3.1 cm x 2.6 cm x 3.7 cm peripherally enhancing collection in the subcutaneous fat along the left aspect of the gluteal cleft consistent with abscess. There is surrounding inflammatory change and overlying skin thickening consistent with concomitant cellulitis. 2. Thickening and nodularity of the left omentum/mesentery again suspicious for metastatic disease, incompletely imaged but also present on the study from 04/29/2020. 3. Asymmetric enlargement of the right ovary with a tubular structure in the right adnexa suspicious for hydrosalpinx, new since 04/29/2020. Recommend further evaluation with pelvic ultrasound.   Assessment/Plan: (ICD-10's: L02.31) 60 y.o. female with leukocytosis and left gluteal swelling concerning for abscess, complicated  by pertinent comorbidities including colon CA slp colectomy, ileostomy, and adjuvant chemotherapy.   - Appreciate medicine admission - Will plan on I&D this afternoon in OR with Dr Christian Mate pending OR/anesthesia availability - All risks, benefits, and alternatives to above procedure(s) were discussed with the patient and her family, all of their questions were answered to their expressed satisfaction, patient expresses she wishes to proceed, and informed consent was obtained.    - NPO + IVF Resuscitation - IV Abx (Rocephin + Vancomycin received in ED)   - Pain control prn   - DVT prophylaxis; hold for OR  - May need therapy eval after surgery given decline in functional status as of late  - Will order home health following OR for wound care if needed   - Further management per primary team; we will follow   All of the above findings and recommendations were discussed with the patient and her daughter at bedside, and all of their questions were answered to their expressed satisfaction.  Thank you for the opportunity to participate in this patient's care.   -- Edison Simon, PA-C [facility] 10/21/2020, 4:08 PM [phone] M-F: 7am - 4pm

## 2020-10-21 NOTE — Progress Notes (Signed)
Elink is following Code Sepsis 

## 2020-10-21 NOTE — ED Notes (Signed)
Pt wants to wait on port access for blood draw

## 2020-10-21 NOTE — Interval H&P Note (Signed)
History and Physical Interval Note:  10/21/2020 4:52 PM  Tanya Osborne Stable  has presented today for surgery, with the diagnosis of Left gluteal Abscess.  The various methods of treatment have been discussed with the patient and family. After consideration of risks, benefits and other options for treatment, the patient has consented to  Procedure(s): INCISION AND DRAINAGE ABSCESS (Left) as a surgical intervention.  The patient's history has been reviewed, patient examined, no change in status, stable for surgery.  I have reviewed the patient's chart and labs.  Questions were answered to the patient's satisfaction.     Ronny Bacon

## 2020-10-21 NOTE — Op Note (Signed)
Incision and drainage left sided perirectal abscess.  Pre-operative Diagnosis: Perirectal abscess, left side.  Post-operative Diagnosis: same.    Surgeon: Ronny Bacon, M.D., [facility]  Anesthesia: General endotracheal  Findings: Wide soft tissue dissection without evidence of necrotizing process, malodorous fluid without remarkable induration/   Estimated Blood Loss: 30 mL         Specimens: Fluid for culture and sensitivity from left perirectal abscess.          Complications: none              Procedure Details  The patient was seen again in the PACU. The benefits, complications, treatment options, and expected outcomes were discussed with the patient. The risks of bleeding, infection, recurrence of symptoms, failure to resolve symptoms, unanticipated injury, any of which could require further surgery were reviewed with the patient. The likelihood of improving the patient's symptoms with return to their baseline status is hopeful.  The patient and/or family concurred with the proposed plan, giving informed consent.  The patient was taken to Operating Room, identified and the procedure verified.    Prior to the induction of general anesthesia, antibiotic prophylaxis was administered.  General anesthesia was then administered and tolerated well. After the induction, the patient was positioned in the lithotomy position and the perineum and perianal region was prepped with Betadine and draped in the sterile fashion.  A Time Out was held and the above information confirmed.  Immediately to the left of the anus there was a large fluctuant area, there was a pinpoint opening that was draining some fluid spontaneously.  Utilizing a cautery an incision was made in the roof of presumed abscess cavity, immediate egress of a large volume of malodorous black-gray serous fluid was obtained.  Specimens obtained for culture and sensitivity.  The roof was incised in a cruciate manner, and the  dysfunctional/nonviable skin was excised.  The cavity was then explored digitally with with careful blunt dissection identifying the recesses present.  There is no evidence of any walled off or indurated cavity wall, most of the soft tissues were fatty soft tissues without evidence of dense of necrosis. The wound was then irrigated with multiple [place] volumes of sterile normal saline solution.  Adequate hemostasis was obtained with electrocautery to the best ability.  The region was then infiltrated with quarter percent Marcaine with epinephrine.  The wound was then packed with approximately a third of a unit of vaginal packing.  It was then covered with 4 x 4 gauze and ABD pad and secured with mesh briefs. She remained tachycardic at the end of the procedure.  She was subsequently extubated and transferred to PACU in stable condition with persistent tachycardia. For this we will obtain an ICU/stepdown bed for the evening.    Ronny Bacon M.D., [facility] 10/21/2020 7:02 PM

## 2020-10-21 NOTE — ED Notes (Signed)
Patient transported to CT 

## 2020-10-21 NOTE — Consult Note (Signed)
[facility] SURGICAL CONSULTATION NOTE (initial) - cpt: 99244   HISTORY OF PRESENT ILLNESS (HPI):  60 y.o. female presented [facility] ED today for evaluation of gluteal abscess. Patient known to our service following a history of colon cancer s/p colectomy and end ileostomy at [facility] in February of 2020. She was admitted to [facility] in April for SBO which resolved with conservative management. Still undergoing chemotherapy at [facility] but has been having increasing neuropathy and weakness. Patient's daughter reports that on Thursday of last week she started to complain of pain in her left gluteal cheek. She thought initially this may have been a "boil" but the swelling and pain progressively got worse. She is unable to move well without having pain. No fevers at home. No drainage from the area. Work up in the ED was concerning for leukocytosis to 31K but her labs are otherwise reassuring. CT Pelvis and her physical examination are both concerning for left gluteal abscess.   Surgery is consulted by emergency medicine physician Dr. Vladimir Crofts, MD in this context for evaluation and management of left gluteal abscess.   PAST MEDICAL HISTORY (PMH):  Past Medical History:  Diagnosis Date   Autoimmune liver disease    Cancer ([place])    Depression    Duodenal ulcer perforation ([place]) 09/08/2008   GERD (gastroesophageal reflux disease)    H. 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 Authorizing  Provider  Cholecalciferol 50 MCG (2000 UT) CAPS Take 2,000 Units by mouth daily.    [provider]  clobetasol ointment (TEMOVATE) 1.24 % Apply 1 application topically 2 (two) times daily. 07/06/19   [provider]  ferrous sulfate 325 (65 FE) MG tablet Take 325 mg by mouth daily with breakfast.    [provider]  lidocaine-prilocaine (EMLA) cream Apply 1 application topically once. 11/28/19   [provider]  Multiple Vitamin (MULTI-VITAMIN) tablet Take 1 tablet by mouth daily.    [provider]  pantoprazole (PROTONIX) 20 MG tablet Take 20 mg by mouth daily. 04/09/20 04/09/21  [provider]     ALLERGIES:  Allergies  Allergen Reactions   Nsaids Swelling and Rash    Other Reaction: OTHER REACTION-GI BLEED   Penicillins Swelling   Celebrex [Celecoxib] Swelling   Cortisone Swelling   Gabapentin Swelling   Methotrexate Derivatives Swelling   Nabumetone Other (See Comments)   Tomato Rash    Joint inflamation     SOCIAL HISTORY:  Social History   Socioeconomic History   Marital status: Divorced    Spouse name: Not on file   Number of children: Not on file   Years of education: Not on file   Highest education level: Not on file  Occupational History   Not on file  Tobacco Use   Smoking status: Not on file   Smokeless tobacco: Not on file  Substance and Sexual Activity   Alcohol use: Not on file  Drug use: Not on file   Sexual activity: Not on file  Other Topics Concern   Not on file  Social History Narrative   Not on file   Social Determinants of Health   Financial Resource Strain: Not on file  Food Insecurity: Not on file  Transportation Needs: Not on file  Physical Activity: Not on file  Stress: Not on file  Social Connections: Not on file  Intimate Partner Violence: Not on file     FAMILY HISTORY:  Family History  Family history unknown: Yes      REVIEW OF SYSTEMS:  Review of Systems   Constitutional:  Negative for chills and fever.  Respiratory:  Negative for cough and shortness of breath.   Cardiovascular:  Negative for chest pain and palpitations.  Gastrointestinal:  Negative for abdominal pain, nausea and vomiting.  Genitourinary:  Negative for dysuria and urgency.  Skin:        + Left Gluteal Abscess  Neurological:  Positive for weakness. Negative for dizziness.  All other systems reviewed and are negative.  VITAL SIGNS:  Temp:  [99.2 F (37.3 C)] 99.2 F (37.3 C) (10/11 1043) Pulse Rate:  [115-128] 115 (10/11 1536) Resp:  [15-18] 15 (10/11 1536) BP: (135-143)/(80-86) 143/80 (10/11 1536) SpO2:  [100 %] 100 % (10/11 1536) Weight:  [65.8 kg] 65.8 kg (10/11 1044)     Height: 5\' 7"  (170.2 cm) Weight: 65.8 kg BMI (Calculated): 22.71   INTAKE/OUTPUT:  No intake/output data recorded.  PHYSICAL EXAM:  Physical Exam Vitals and nursing note reviewed. Exam conducted with a chaperone present.  Constitutional:      General: She is not in acute distress.    Appearance: Normal appearance. She is not ill-appearing.     Comments: Patient resting in bed, NAD, daughter at bedside   HENT:     Head: Normocephalic and atraumatic.  Eyes:     General: No scleral icterus.    Conjunctiva/sclera: Conjunctivae normal.  Cardiovascular:     Rate and Rhythm: Regular rhythm. Tachycardia present.     Pulses: Normal pulses.     Heart sounds: No murmur heard. Pulmonary:     Effort: Pulmonary effort is normal. No respiratory distress.     Comments: On [place] Chest:     Comments: Port in right chest wall Abdominal:     General: The ostomy site is clean. There is no distension.     Palpations: Abdomen is soft.     Tenderness: There is no abdominal tenderness.     Comments: End ileostomy present  Genitourinary:   Skin:    General: Skin is warm and dry.     Coloration: Skin is not pale.     Findings: No erythema.  Neurological:     General: No focal deficit present.     Mental  Status: She is alert and oriented to person, place, and time.  Psychiatric:        Mood and Affect: Mood normal.        Behavior: Behavior normal.     Labs:  CBC Latest Ref Rng & Units 10/21/2020 08/14/2020 05/01/2020  WBC 4.0 - 10.5 K/uL 31.5(H) 13.4(H) 6.8  Hemoglobin 12.0 - 15.0 g/dL 10.9(L) 13.3 10.2(L)  Hematocrit 36.0 - 46.0 % 31.9(L) 41.4 31.9(L)  Platelets 150 - 400 K/uL 167 186 145(L)   CMP Latest Ref Rng & Units 10/21/2020 08/14/2020 05/01/2020  Glucose 70 - 99 mg/dL 90 99 75  BUN 8 - 23 mg/dL 17 40(H) 33(H)  Creatinine 0.44 - 1.00 mg/dL 1.60(H) 1.93(H) 2.03(H)  Sodium 135 - 145 mmol/L 141 136 141  Potassium 3.5 - 5.1 mmol/L 3.9 4.3 4.0  Chloride 98 - 111 mmol/L 112(H) 108 116(H)  CO2 22 - 32 mmol/L 21(L) 21(L) 17(L)  Calcium 8.9 - 10.3 mg/dL 8.7(L) 9.4 8.7(L)  Total Protein 6.5 - 8.1 g/dL - 7.2 -  Total Bilirubin 0.3 - 1.2 mg/dL - 1.0 -  Alkaline Phos 38 - 126 U/L - 238(H) -  AST 15 - 41 U/L - 26 -  ALT 0 - 44 U/L - 33 -     Imaging studies:   CT Pelvis (10/21/2020) personally reviewed showing and area of fluid concerning for left gluteal abscess with surrounding stranding, and radiologist report reviewed:  IMPRESSION: 1. 3.1 cm x 2.6 cm x 3.7 cm peripherally enhancing collection in the subcutaneous fat along the left aspect of the gluteal cleft consistent with abscess. There is surrounding inflammatory change and overlying skin thickening consistent with concomitant cellulitis. 2. Thickening and nodularity of the left omentum/mesentery again suspicious for metastatic disease, incompletely imaged but also present on the study from 04/29/2020. 3. Asymmetric enlargement of the right ovary with a tubular structure in the right adnexa suspicious for hydrosalpinx, new since 04/29/2020. Recommend further evaluation with pelvic ultrasound.   Assessment/Plan: (ICD-10's: L02.31) 60 y.o. female with leukocytosis and left gluteal swelling concerning for abscess, complicated  by pertinent comorbidities including colon CA slp colectomy, ileostomy, and adjuvant chemotherapy.   - Appreciate medicine admission - Will plan on I&D this afternoon in OR with Dr Christian Mate pending OR/anesthesia availability - All risks, benefits, and alternatives to above procedure(s) were discussed with the patient and her family, all of their questions were answered to their expressed satisfaction, patient expresses she wishes to proceed, and informed consent was obtained.    - NPO + IVF Resuscitation - IV Abx (Rocephin + Vancomycin received in ED)   - Pain control prn   - DVT prophylaxis; hold for OR  - May need therapy eval after surgery given decline in functional status as of late  - Will order home health following OR for wound care if needed   - Further management per primary team; we will follow   All of the above findings and recommendations were discussed with the patient and her daughter at bedside, and all of their questions were answered to their expressed satisfaction.  Thank you for the opportunity to participate in this patient's care.   -- Edison Simon, PA-C [facility] 10/21/2020, 4:08 PM [phone] M-F: 7am - 4pm

## 2020-10-21 NOTE — Consult Note (Addendum)
NAME:  Tanya Osborne, MRN:  [id number], DOB:  1961/01/01, LOS: 0 ADMISSION DATE:  10/21/2020, CONSULTATION DATE: 10/21/2020 REFERRING MD: Brett Albino, MD CHIEF COMPLAINT: Left gluteal abscess   HPI  60 y.o with significant PMH of stage III colon cancer s/p total colectomy and ileostomy in February, SBO secondary to postsurgical adhesion, perforated duodenal ulcer and chronic stage IV kidney disease who presented to the ED with chief complaints of left gluteal mass associated with the discomfort and burning sensation.  ED Course: On arrival to the ED, she was afebrile with blood pressure 135/86 mm Hg and pulse rate 115 beats/min,RR 17, Sats 100% on 2L..  Labs/Diagnostics WBC/Hgb/Hct/Plts:  31.5/10.9/31.9/167 (10/11 1430)  Sodium 141, potassium 3.9, chloride 112, CO2 21, BUN 17, creatinine 1.6, calcium 8.7 BNP:55.7 EKG: Sinus tachycardia with a rate of 121 bpm.  Normal axis and intervals.  No evidence of acute ischemia. UA: Pyuria with no evidence of Lactate: 1.2>3.7 Imaging / Other Labs; chest x-ray showed no active cardiopulmonary process.  CT pelvis left gluteal abscess and overlying skin thickening consistent with concomitant cellulitis with suspected mets to the left omentum/mesentery. Sepsis protocol was initiated and patient started on broad-spectrum antibiotics following IV fluid resuscitation.  Case discussed with surgery on-call who recommended I&D.  Patient was admitted by hospitalist service  Hospital Course: Patient was taken to the OR for incision and drainage of left gluteal abscess.  Following completion of the procedure, she was successfully extubated to nasal cannula.  She however remained tachycardic in the 120s but hemodynamically stable not requiring any pressors.  PCCM consulted for close observation in the ICU procedure. Past Medical History  Stage III colon cancer in February 2020 s/p 12 cycles of FOLFOX chemotherapy S/p total colectomy and ileostomy in February  2020 (Dr Maxie Better Rob Hickman) secondary to colon CA SBO secondary to postsurgical adhesions Perforated duodenal ulcer Autoimmune liver disease GERD Hyperlipidemia Iron deficiency anemia Rheumatoid arthritis CKD (chronic kidney disease) stage 3, GFR 30-59 ml/min ([place] Primary sclerosing cholangitis Hypertension Significant Hospital Events   10/11: Admitted to hospitalist service with left gluteal abscess s/p I&D.  PCCM consulted  Consults:  General surgery PCCM  Procedures:  10/11: None drainage of left gluteal abscess  Significant Diagnostic Tests:  10/11: Chest Xray> no active cardiopulmonary process 10/11: CT pelvis>1. 3.1 cm x 2.6 cm x 3.7 cm peripherally enhancing collection in the subcutaneous fat along the left aspect of the gluteal cleft consistent with abscess. There is surrounding inflammatory change and overlying skin thickening consistent with concomitant cellulitis. 2. Thickening and nodularity of the left omentum/mesentery again suspicious for metastatic disease, incompletely imaged but also present on the study from 04/29/2020. 3. Asymmetric enlargement of the right ovary with a tubular structure in the right adnexa suspicious for hydrosalpinx, new since 04/29/2020. Recommend further evaluation with pelvic ultrasound.   Micro Data:  10/11: SARS-CoV-2 PCR> negative 10/11: Influenza PCR> negative 10/11: Blood culture x2> 10/11: Urine Culture> 10/11: MRSA PCR>>   Antimicrobials:  Vancomycin 10/11> Ceftriaxone 10/11> Metronidazole 10/11>  OBJECTIVE  Blood pressure (!) 158/104, pulse (!) 132, temperature 98.3 F (36.8 C), resp. rate 19, height 5\' 7"  (1.702 m), weight 65.8 kg, SpO2 100 %.        Intake/Output Summary (Last 24 hours) at 10/21/2020 1956 Last data filed at 10/21/2020 1847 Gross per 24 hour  Intake 300 ml  Output --  Net 300 ml   Filed Weights   10/21/20 1044  Weight: 65.8 kg   Physical Examination  GENERAL:  60 year-old critically ill  patient lying in the bed with no acute distress.  EYES: Pupils equal, round, reactive to light and accommodation. No scleral icterus. Extraocular muscles intact.  HEENT: Head atraumatic, normocephalic. Oropharynx and nasopharynx clear.  NECK:  Supple, no jugular venous distention. No thyroid enlargement, no tenderness.  LUNGS: Normal breath sounds bilaterally, no wheezing, rales,rhonchi or crepitation. No use of accessory muscles of respiration.  CARDIOVASCULAR: S1, S2 normal. No murmurs, rubs, or gallops. Port in right chest wall ABDOMEN: Soft, nontender, nondistended. Bowel sounds present. No organomegaly or mass. End ileostomy present  EXTREMITIES: No pedal edema, cyanosis, or clubbing.  NEUROLOGIC: Cranial nerves II through XII are intact.  Muscle strength 5/5 in all extremities. Sensation intact. Gait not checked.  PSYCHIATRIC: The patient is alert and oriented x 3.  SKIN: No obvious rash, lesion, or ulcer. Incision site dressing intact without drainage Labs/imaging that I havepersonally reviewed  (right click and "Reselect all SmartList Selections" daily)     Labs   CBC: Recent Labs  Lab 10/21/20 1430  WBC 31.5*  HGB 10.9*  HCT 31.9*  MCV 94.1  PLT 258    Basic Metabolic Panel: Recent Labs  Lab 10/21/20 1430  NA 141  K 3.9  CL 112*  CO2 21*  GLUCOSE 90  BUN 17  CREATININE 1.60*  CALCIUM 8.7*   GFR: Estimated Creatinine Clearance: 35.5 mL/min (A) (by C-G formula based on SCr of 1.6 mg/dL (H)). Recent Labs  Lab 10/21/20 1430 10/21/20 1635  PROCALCITON 3.10  --   WBC 31.5*  --   LATICACIDVEN 1.2 3.7*    Liver Function Tests: No results for input(s): AST, ALT, ALKPHOS, BILITOT, PROT, ALBUMIN in the last 168 hours. No results for input(s): LIPASE, AMYLASE in the last 168 hours. No results for input(s): AMMONIA in the last 168 hours.  ABG No results found for: PHART, PCO2ART, PO2ART, HCO3, TCO2, ACIDBASEDEF, O2SAT   Coagulation Profile: Recent Labs  Lab  10/21/20 1430  INR 1.2    Cardiac Enzymes: No results for input(s): CKTOTAL, CKMB, CKMBINDEX, TROPONINI in the last 168 hours.  HbA1C: No results found for: HGBA1C  CBG: Recent Labs  Lab 10/21/20 1950  GLUCAP 59*    Review of Systems:   Review of Systems  Constitutional:  Positive for malaise/fatigue.  HENT: Negative.    Eyes: Negative.   Respiratory: Negative.    Cardiovascular: Negative.   Gastrointestinal: Negative.   Genitourinary: Negative.   Musculoskeletal:  Positive for joint pain and neck pain.  Neurological: Negative.   Endo/Heme/Allergies: Negative.   Psychiatric/Behavioral:  Positive for depression. The patient is nervous/anxious.    Past Medical History  She,  has a past medical history of Autoimmune liver disease, Cancer ([place]), Depression, Duodenal ulcer perforation ([place]) (09/08/2008), GERD (gastroesophageal reflux disease), H. pylori infection (2010), Hypercholesterolemia (2018), Iron deficiency anemia, Kidney disease, chronic, stage IV (GFR 15-29 ml/min) ([place]), Primary sclerosing cholangitis (07/19/2014), and RA (rheumatoid arthritis) ([place]).   Surgical History    Past Surgical History:  Procedure Laterality Date   PYLOROPLASTY  09/08/2008   with omental patch   TOTAL COLECTOMY  03/06/2018   total abdominal colectomy with end ileostomy   TUBAL LIGATION      Social History   reports that she has never smoked. She has never used smokeless tobacco. She reports that she does not drink alcohol and does not use drugs.   Family History   Her family history includes Heart disease in her mother; Lung disease  in her mother.   Allergies Allergies  Allergen Reactions   Nsaids Swelling and Rash    Other Reaction: OTHER REACTION-GI BLEED   Penicillins Swelling   Celebrex [Celecoxib] Swelling   Cortisone Swelling   Gabapentin Swelling   Methotrexate Derivatives Swelling   Nabumetone Other (See Comments)   Tomato Rash    Joint inflamation    Home  Medications  Prior to Admission medications   Medication Sig Start Date End Date Taking? Authorizing Provider  Cholecalciferol 50 MCG (2000 UT) CAPS Take 2,000 Units by mouth daily.   Yes [provider]  [facility] Liver Oil 5000-500 UNIT/5ML OIL Take 1 capsule by mouth daily.   Yes [provider]  dexamethasone (DECADRON) 4 MG tablet Take 4-8 mg by mouth daily.   Yes [provider]  lidocaine-prilocaine (EMLA) cream Apply 1 application topically once. 11/28/19  Yes [provider]  oxyCODONE (OXY IR/ROXICODONE) 5 MG immediate release tablet Take 10 mg by mouth every 6 (six) hours as needed for severe pain.   Yes [provider]  pantoprazole (PROTONIX) 20 MG tablet Take 20 mg by mouth daily. 04/09/20 04/09/21 Yes [provider]  predniSONE (DELTASONE) 5 MG tablet Take 5 mg by mouth daily. 09/22/20  Yes [provider]  clobetasol ointment (TEMOVATE) 6.83 % Apply 1 application topically 2 (two) times daily. 07/06/19   [provider]  ferrous sulfate 325 (65 FE) MG tablet Take 325 mg by mouth daily with breakfast. Patient not taking: Reported on 10/21/2020    [provider]  Multiple Vitamin (MULTI-VITAMIN) tablet Take 1 tablet by mouth daily. Patient not taking: Reported on 10/21/2020    [provider]    Scheduled Meds:  [START ON 10/22/2020] Chlorhexidine Gluconate Cloth  6 each Topical Q0600   dextrose       predniSONE  50 mg Oral Q breakfast   Continuous Infusions:  [START ON 10/22/2020] cefTRIAXone (ROCEPHIN)  IV     lactated ringers     lactated ringers     metronidazole     [START ON 10/22/2020] vancomycin     PRN Meds:.acetaminophen, hydrALAZINE, morphine injection, ondansetron (ZOFRAN) IV, oxyCODONE-acetaminophen  Active Hospital Problem list   Sepsis secondary left gluteal abscess/cellulitis AKI on CKD Colon CA slp colectomy, ileostomy, and adjuvant chemotherapy Iron deficiency  anemia Rheumatoid arthritis  Assessment & Plan:  Sepsis without Septic Shock due to left Gluteal Abscess/Cellulitis Lactic: 1.2>3.7> 5.4, Baseline PCT: 3.10, UA: Urine, CXR: Negative, CT: + Left gluteal abscess/cellulitis Initial interventions/workup included: L of NS/LR & ceftriaxone/ Vancomycin/ Metronidazole S/p I&D POD # 0 -Supplemental oxygen as needed, to maintain SpO2 > 90% -Follow Blood and Urine Cx, trend Lactic/ PCT -Daily CBC -Monitor WBC/ fever curve -Continue IV antibiotics: vancomycin & ceftriaxone & trend metronidazole -IVF hydration as needed. Will consider vasopressors to maintain MAP>65 -Strict I/O's: Keep UOP > 0.5 mL/kg/hr -General surgery following, input appreciated  Hypertension maintaining normotensive Sinus Tachycardia likely in the setting of sepsis as above -Continuous cardiac monitoring -Maintain MAP greater than 65 -IVFs    AKI on CKD Stage III Baseline creatinine 1.7-1.9 recently.  Her creatinine is 1.6, BUN 17 -Monitor I&O's / urinary output -Follow BMP -Ensure adequate renal perfusion -Avoid nephrotoxic agents as able -Replace electrolytes as indicated   Stage III colon cancer (February 2020) s/p 12 cycles of FOLFOX chemotherapy Last dose 2 weeks ago S/p total colectomy and ileostomy in February 2020 (Dr Maxie Better Rob Hickman)  -Follows with [facility] oncology -Scheduled and  PRN pain management  Iron deficiency anemia Current Hgb stable at 10.9 -Follow CBC  Rheumatoid arthritis Hx: Autoimmune liver disease, primary sclerosing cholangitis -Continue prednisone as ordered  Best practice:  Diet:  Oral Pain/Anxiety/Delirium protocol (if indicated): No VAP protocol (if indicated): Not indicated DVT prophylaxis: SCD GI prophylaxis: PPI Glucose control:  SSI No Central venous access:  N/A Arterial line:  N/A Foley:  Yes, and it is still needed Mobility:  bed rest  PT consulted: N/A Last date of multidisciplinary goals of care discussion [10/11] Code  Status:  full code Disposition: STEPDOWN   = Goals of Care = Code Status Order: FULL  Primary Emergency Contact: [facility],latoya Wishes to pursue full aggressive treatment and intervention options, including CPR and intubation, but goals of care will be addressed on going with family if that should become necessary.  Critical care time: 45 minutes     Rufina Falco, DNP, CCRN, FNP-C, AGACNP-BC Acute Care Nurse Practitioner  [facility] Pager: [phone] [place] [facility]  .

## 2020-10-21 NOTE — Anesthesia Postprocedure Evaluation (Signed)
Anesthesia Post Note  Patient: Kathlee Barnhardt  Procedure(s) Performed: INCISION AND DRAINAGE ABSCESS (Left)  Patient location during evaluation: PACU Anesthesia Type: General Level of consciousness: awake and alert Pain management: pain level controlled Vital Signs Assessment: post-procedure vital signs reviewed and stable Respiratory status: spontaneous breathing, nonlabored ventilation, respiratory function stable and patient connected to nasal cannula oxygen Cardiovascular status: blood pressure returned to baseline and stable Postop Assessment: no apparent nausea or vomiting Anesthetic complications: no   No notable events documented.   Last Vitals:  Vitals:   10/21/20 2000 10/21/20 2100  BP: (!) 158/90 (!) 146/86  Pulse: (!) 126 (!) 127  Resp: 12 (!) 25  Temp:    SpO2: 100% 100%    Last Pain:  Vitals:   10/21/20 1945  TempSrc: Oral  PainSc: 0-No pain                 Precious Haws Wilmary Levit

## 2020-10-21 NOTE — ED Notes (Signed)
Pt hooked up to purewick to urinate. UA sent to lab

## 2020-10-21 NOTE — ED Notes (Signed)
Port was accessed by this RN, blood return noted, date and time noted on dressing.

## 2020-10-21 NOTE — ED Notes (Signed)
Patient assisted into bed and hooked up to bedside monitor.

## 2020-10-21 NOTE — Anesthesia Preprocedure Evaluation (Addendum)
Anesthesia Evaluation  Patient identified by MRN, date of birth, ID band Patient awake    Reviewed: Allergy & Precautions, NPO status , Patient's Chart, lab work & pertinent test results  History of Anesthesia Complications (+) Emergence Delirium and history of anesthetic complications  Airway Mallampati: III  TM Distance: >3 FB Neck ROM: full    Dental  (+) Chipped, Poor Dentition, Missing   Pulmonary neg pulmonary ROS, neg shortness of breath,    Pulmonary exam normal        Cardiovascular hypertension,  Rate:Tachycardia     Neuro/Psych PSYCHIATRIC DISORDERS negative neurological ROS     GI/Hepatic Neg liver ROS, PUD, GERD  Medicated and Controlled,  Endo/Other  negative endocrine ROS  Renal/GU Renal disease     Musculoskeletal  (+) Arthritis ,   Abdominal   Peds  Hematology negative hematology ROS (+)   Anesthesia Other Findings Septic   Past Medical History: No date: Autoimmune liver disease No date: Cancer ([place]) No date: Depression 09/08/2008: Duodenal ulcer perforation ([facility]) No date: GERD (gastroesophageal reflux disease) 2010: H. pylori infection 2018: Hypercholesterolemia No date: Iron deficiency anemia No date: Kidney disease, chronic, stage IV (GFR 15-29 ml/min) ([place]) 07/19/2014: Primary sclerosing cholangitis No date: RA (rheumatoid arthritis) ([place])  Past Surgical History: 09/08/2008: PYLOROPLASTY     Comment:  with omental patch 03/06/2018: TOTAL COLECTOMY     Comment:  total abdominal colectomy with end ileostomy No date: TUBAL LIGATION  BMI    Body Mass Index: 22.71 kg/m      Reproductive/Obstetrics negative OB ROS                            Anesthesia Physical Anesthesia Plan  ASA: 4 and emergent  Anesthesia Plan: General ETT   Post-op Pain Management:    Induction: Intravenous  PONV Risk Score and Plan: Ondansetron, Dexamethasone, Midazolam and  Treatment may vary due to age or medical condition  Airway Management Planned: Oral ETT  Additional Equipment:   Intra-op Plan:   Post-operative Plan: Extubation in OR  Informed Consent: I have reviewed the patients History and Physical, chart, labs and discussed the procedure including the risks, benefits and alternatives for the proposed anesthesia with the patient or authorized representative who has indicated his/her understanding and acceptance.     Dental Advisory Given  Plan Discussed with: Anesthesiologist, CRNA and Surgeon  Anesthesia Plan Comments: (Patient and daughter consented for risks of anesthesia including but not limited to:  - adverse reactions to medications - damage to eyes, teeth, lips or other oral mucosa - nerve damage due to positioning  - sore throat or hoarseness - Damage to heart, brain, nerves, lungs, other parts of body or loss of life  They voiced understanding.)       Anesthesia Quick Evaluation

## 2020-10-21 NOTE — ED Triage Notes (Signed)
Pt arrived via ems from home. Ems reports pts pt is cancer pt, started chemo in august. Daughter concerned there is a mass in left buttocks that is causing discomfort and burning sensation. Daughter first noted growth this past Friday and reported size is between a tennis and softball. Pt a&o x 4. Last chemo treatment 2 weeks ago  99.3 temp 134/76 124 hr 100%RA 29 capn 91 cbg

## 2020-10-21 NOTE — ED Provider Notes (Addendum)
[facility] Emergency Department Provider Note ____________________________________________   Event Date/Time   First MD Initiated Contact with Patient 10/21/20 1325     (approximate)  I have reviewed the triage vital signs and the nursing notes.  HISTORY  Chief Complaint Weakness and Abscess   HPI Tanya Osborne is a 60 y.o. femalewho presents to the ED for evaluation of increasing weakness and abscess.   Chart review indicates history of stage IV colon cancer receiving palliative chemotherapy.  Known stage II sacral/coccygeal pressure injury.  Rheumatoid arthritis on chronic prednisone, CKD.   Patient presents to the ED, accompanied by her daughter/POA/caregiver, for evaluation of left-sided gluteal swelling, pain and generalized weakness.  They report this "bump" developing over the past few days, and increasing in size with associated increasing pain.  She reports poorly controlled pain despite her home oxycodone's, up to 10/10 intensity.  Reports poor p.o. intake and concern for dehydration.  Past Medical History:  Diagnosis Date   Autoimmune liver disease    Cancer ([place])    Depression    Duodenal ulcer perforation ([place]) 09/08/2008   GERD (gastroesophageal reflux disease)    H. 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 Authorizing Provider  Cholecalciferol 50 MCG (2000 UT) CAPS Take 2,000 Units by mouth daily.    [provider]  clobetasol ointment (TEMOVATE) 7.78 % Apply 1 application topically 2 (two) times daily. 07/06/19   [provider]  ferrous sulfate 325 (65 FE) MG tablet Take 325 mg by mouth daily with breakfast.    [provider]  lidocaine-prilocaine (EMLA) cream Apply 1 application topically once. 11/28/19   [provider]  Multiple Vitamin (MULTI-VITAMIN) tablet Take 1 tablet by mouth daily.    [provider]  pantoprazole (PROTONIX) 20 MG tablet Take 20 mg by mouth daily. 04/09/20 04/09/21  [provider]    Allergies Nsaids, Penicillins, Celebrex [celecoxib], Cortisone, Gabapentin, Methotrexate derivatives, Nabumetone, and Tomato  Family History  Family history unknown: Yes    Social History    Review of Systems  Constitutional: Positive generalized weakness and subjective chills Eyes: No visual changes. ENT: No sore throat. Cardiovascular: Denies chest pain. Respiratory: Denies shortness of breath. Gastrointestinal: No abdominal pain.  No nausea, no vomiting.  No diarrhea.  No constipation. Genitourinary: Negative for dysuria. Musculoskeletal: Negative for back pain. Positive for atraumatic left-sided gluteal pain. Skin: Negative for rash. Neurological: Negative for headaches, focal weakness or numbness.  ____________________________________________   PHYSICAL EXAM:  VITAL SIGNS: Vitals:   10/21/20 1432 10/21/20 1536  BP: 135/86 (!) 143/80  Pulse: (!) 115 (!) 115  Resp: 17 15  Temp:    SpO2: 100% 100%  Constitutional: Alert and oriented.  Appears uncomfortable, but without distress. Eyes: Conjunctivae are normal. PERRL. EOMI. Head: Atraumatic. Nose: No congestion/rhinnorhea. Mouth/Throat: Mucous membranes are dry.   Oropharynx non-erythematous. Neck: No stridor. No cervical spine tenderness to palpation. Cardiovascular: Tachycardic rate, regular rhythm. Grossly normal heart sounds.  Good peripheral circulation. Respiratory: Normal respiratory effort.  No retractions. Lungs CTAB. Gastrointestinal: Soft , nondistended, nontender to palpation. No CVA tenderness. Ostomy with liquid output Musculoskeletal: No lower extremity tenderness nor edema.  No joint effusions. No signs of acute trauma. Port to the right-sided chest Neurologic:  Normal speech and language. No gross focal neurologic deficits are appreciated. No gait instability noted. Skin:  Skin is warm, dry and intact.   Site of concern is medial left-sided gluteus area, just lateral to her anus, demonstrating a vertical/longitudinally oriented area of induration about 2 cm x 8 cm in length with some underlying associated fluctuance.  Exquisitely tender making examination difficult.  Psychiatric: Mood and affect are normal. Speech and behavior are normal.  ____________________________________________   LABS (all labs ordered are listed, but only abnormal results are displayed)  Labs Reviewed  BASIC METABOLIC PANEL - Abnormal; Notable for the following components:      Result Value   Chloride 112 (*)    CO2 21 (*)    Creatinine, Ser 1.60 (*)    Calcium 8.7 (*)    GFR, Estimated 36 (*)    All other components within normal limits  CBC - Abnormal; Notable for the following components:   WBC 31.5 (*)    RBC 3.39 (*)    Hemoglobin 10.9 (*)    HCT 31.9 (*)    RDW 17.8 (*)    All other components within normal limits  URINALYSIS, COMPLETE (UACMP) WITH MICROSCOPIC - Abnormal; Notable for the following components:   Color, Urine AMBER (*)    APPearance HAZY (*)    Hgb urine dipstick SMALL (*)    Protein, ur 30 (*)    Bacteria, UA RARE (*)    All other components within normal limits  CULTURE, BLOOD (SINGLE)  LACTIC ACID, PLASMA   PROCALCITONIN  LACTIC ACID, PLASMA   ____________________________________________  12 Lead EKG  Sinus tachycardia with a rate of 121 bpm.  Normal axis and intervals.  No evidence of acute ischemia. ____________________________________________  RADIOLOGY  ED MD interpretation: CXR reviewed by me without evidence of acute cardiopulmonary pathology.  CT pelvis read pending. Reviewed by me with abscess and surrounding inflammatory stranding  Official radiology report(s): DG Chest Portable 1 View  Result Date: 10/21/2020 CLINICAL DATA:  Mass in left buttocks causing discomfort and burning sensation. EXAM: PORTABLE CHEST 1 VIEW COMPARISON:  None. FINDINGS: A right-sided venous Port-A-Cath is seen with its distal tip noted approximate 1.4 cm proximal to the junction of the superior vena cava and right atrium. The heart size and mediastinal contours are within normal limits. Both lungs are clear. The visualized skeletal structures are unremarkable. IMPRESSION: No active disease. Electronically Signed   By: Virgina Norfolk M.D.   On: 10/21/2020 15:12    ____________________________________________   PROCEDURES and INTERVENTIONS  Procedure(s) performed (including Critical Care):  .1-3 Lead EKG Interpretation Performed by: Vladimir Crofts, MD Authorized by: Vladimir Crofts, MD     Interpretation: abnormal     ECG rate:  112   ECG rate assessment: tachycardic     Rhythm: sinus tachycardia     Ectopy: none     Conduction: normal   .Critical Care Performed by:  Vladimir Crofts, MD Authorized by: Vladimir Crofts, MD   Critical care provider statement:    Critical care time (minutes):  30   Critical care time was exclusive of:  Separately billable procedures and treating other patients   Critical care was necessary to treat or prevent imminent or life-threatening deterioration of the following conditions:  Sepsis   Critical care was time spent personally by me on the following activities:   Ordering and performing treatments and interventions, ordering and review of laboratory studies, ordering and review of radiographic studies, pulse oximetry, re-evaluation of patient's condition, evaluation of patient's response to treatment and examination of patient  Medications  cefTRIAXone (ROCEPHIN) 1 g in sodium chloride 0.9 % 100 mL IVPB (1 g Intravenous New Bag/Given 10/21/20 1531)  vancomycin (VANCOREADY) IVPB 1500 mg/300 mL (has no administration in time range)  lactated ringers infusion (has no administration in time range)  lactated ringers bolus 1,000 mL (has no administration in time range)  HYDROmorphone (DILAUDID) injection 1 mg (1 mg Intravenous Given 10/21/20 1444)  lactated ringers bolus 1,000 mL (1,000 mLs Intravenous New Bag/Given 10/21/20 1443)  iohexol (OMNIPAQUE) 350 MG/ML injection 80 mL (60 mLs Intravenous Contrast Given 10/21/20 1514)    ____________________________________________   MDM / ED COURSE   60 year old colon cancer patient presents to the ED with evidence of sepsis due to possible perirectal abscess.  She is tachycardic but hemodynamically stable.  Blood work returning with leukocytosis to 31,000.  Exam with area of induration of fluctuance to the medial aspect of her left-sided gluteus concerning for the possibility of a deeper space infection such as a perirectal abscess.  Sepsis protocol initiated and CT pelvis is pending to evaluate for deep extension of this area.  Anticipate medical admission with the possibility of surgical intervention pending the CT.  Patient signed out to oncoming provider to follow-up on this CT and dispo accordingly.  Clinical Course as of 10/21/20 1556  Tue Oct 21, 2020  1442 Elevated white count noted.  Sepsis protocols initiated. [DS]  2671 I discussed the case with surgery, Dr. Christian Mate, and his PA Otho Ket has come down to the ED and is evaluating the patient. Will page hosptialist for admission [DS]    Clinical  Course User Index [DS] Vladimir Crofts, MD    ____________________________________________   FINAL CLINICAL IMPRESSION(S) / ED DIAGNOSES  Final diagnoses:  Sepsis without acute organ dysfunction, due to unspecified organism [facility])  Dehydration  Generalized weakness     ED Discharge Orders     None        Sylwia Cuervo   Note:  This document was prepared using Dragon voice recognition software and may include unintentional dictation errors.    Vladimir Crofts, MD 10/21/20 1449    Vladimir Crofts, MD 10/21/20 1556

## 2020-10-21 NOTE — Sepsis Progress Note (Signed)
Secure chat with bedside nurse regarding 3rd lactic acid. It is being drawn at this time.

## 2020-10-21 NOTE — H&P (Signed)
History and Physical    Tanya Osborne JHE:[id number] DOB: 1960-07-08 DOA: 10/21/2020  Referring MD/NP/PA:   PCP: Annice Needy, MD   Patient coming from:  The patient is coming from home.    Chief Complaint: pain left gluteal area due to abscess  HPI: Tanya Osborne is a 60 y.o. female with medical history significant of rheumatoid arthritis on prednisone chronically, hypertension, hyperlipidemia, GERD, primary sclerosing cholangitis, CKD-3B, anemia, perforated duodenal ulcer, autoimmune liver disease, small bowel obstruction, colon cancer (s/p colectomy and end ileostomy at [facility] in February of 2020. Currently on chemotherapy, last dose was 2 weeks ago), who presents with pain in left gluteal area.  Per her daughter at the bedside, patient complains of pain in left gluteal area, which has been going for about 5 days. She thought initially this may have been a "boil", which has been progressively worsening.  The area has been enlarging and more swollen, more painful.  No active drainage.  Patient does not have fever or chills.  Patient denies chest pain, cough, shortness of breath.  No nausea, vomiting, diarrhea or abdominal pain.  No symptoms of UTI.  No acute issues with colostomy bag. Pt has Port-A in right upper chest.  ED Course: pt was found to have WBC 31.5, lactic acid of 1.2, stable renal function, negative urinalysis, temperature 99.2, blood pressure 143/80, heart rate 128, RR 18, oxygen saturation 100% on room air. CT Pelvis showed a left gluteal abscess.  Dr. Lucila Maine of general surgery is consulted.  Review of Systems:   General: no fevers, chills, no body weight gain, has fatigue HEENT: no blurry vision, hearing changes or sore throat Respiratory: no dyspnea, coughing, wheezing CV: no chest pain, no palpitations GI: no nausea, vomiting, abdominal pain, diarrhea, constipation GU: no dysuria, burning on urination, increased urinary frequency, hematuria  Ext: no leg  edema Neuro: no unilateral weakness, numbness, or tingling, no vision change or hearing loss Skin: has pain and abscess in left gluteal area. MSK: No muscle spasm, no deformity, no limitation of range of movement in spin Heme: No easy bruising.  Travel history: No recent long distant travel.  Allergy:  Allergies  Allergen Reactions   Nsaids Swelling and Rash    Other Reaction: OTHER REACTION-GI BLEED   Penicillins Swelling   Celebrex [Celecoxib] Swelling   Cortisone Swelling   Gabapentin Swelling   Methotrexate Derivatives Swelling   Nabumetone Other (See Comments)   Tomato Rash    Joint inflamation    Past Medical History:  Diagnosis Date   Autoimmune liver disease    Cancer ([place])    Depression    Duodenal ulcer perforation ([place]) 09/08/2008   GERD (gastroesophageal reflux disease)    H. 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) (HCC)    Primary sclerosing cholangitis 07/19/2014   RA (rheumatoid arthritis) ([place])     Past Surgical History:  Procedure Laterality Date   PYLOROPLASTY  09/08/2008   with omental patch   TOTAL COLECTOMY  03/06/2018   total abdominal colectomy with end ileostomy   TUBAL LIGATION      Social History:  reports that she has never smoked. She has never used smokeless tobacco. She reports that she does not drink alcohol and does not use drugs.  Family History:  Family History  Problem Relation Age of Onset   Heart disease Mother    Lung disease Mother  Prior to Admission medications   Medication Sig Start Date End Date Taking? Authorizing Provider  Cholecalciferol 50 MCG (2000 UT) CAPS Take 2,000 Units by mouth daily.    [provider]  clobetasol ointment (TEMOVATE) 5.59 % Apply 1 application topically 2 (two) times daily. 07/06/19   [provider]  ferrous sulfate 325 (65 FE) MG tablet Take 325 mg by mouth daily with  breakfast.    [provider]  lidocaine-prilocaine (EMLA) cream Apply 1 application topically once. 11/28/19   [provider]  Multiple Vitamin (MULTI-VITAMIN) tablet Take 1 tablet by mouth daily.    [provider]  pantoprazole (PROTONIX) 20 MG tablet Take 20 mg by mouth daily. 04/09/20 04/09/21  [provider]    Physical Exam: Vitals:   10/21/20 1432 10/21/20 1536 10/21/20 1632 10/21/20 1740  BP: 135/86 (!) 143/80 (!) 158/86 (!) 171/97  Pulse: (!) 115 (!) 115 (!) 115 (!) 113  Resp: '17 15 14 ' (!) 21  Temp:      TempSrc:      SpO2: 100% 100% 99% 100%  Weight:      Height:       General: Not in acute distress HEENT:       Eyes: PERRL, EOMI, no scleral icterus.       ENT: No discharge from the ears and nose,        Neck: No JVD, no bruit, no mass felt. Heme: No neck lymph node enlargement. Cardiac: S1/S2, RRR, No murmurs, No gallops or rubs. Respiratory: No rales, wheezing, rhonchi or rubs. GI: Soft, nondistended, nontender, no rebound pain, no organomegaly, BS present. Has colostomy bag in place  GU: No hematuria Ext: No pitting leg edema bilaterally. 1+DP/PT pulse bilaterally. Musculoskeletal: No joint deformities, No joint redness or warmth, no limitation of ROM in spin. Skin: pt has tender mass in left gluteal area with fluctuation, consistent with abscess. Neuro: Alert, oriented X3, cranial nerves II-XII grossly intact, moves all extremities normally. Psych: Patient is not psychotic, no suicidal or hemocidal ideation.  Labs on Admission: I have personally reviewed following labs and imaging studies  CBC: Recent Labs  Lab 10/21/20 1430  WBC 31.5*  HGB 10.9*  HCT 31.9*  MCV 94.1  PLT 741   Basic Metabolic Panel: Recent Labs  Lab 10/21/20 1430  NA 141  K 3.9  CL 112*  CO2 21*  GLUCOSE 90  BUN 17  CREATININE 1.60*  CALCIUM 8.7*   GFR: Estimated Creatinine Clearance: 35.5 mL/min (A) (by C-G formula based on SCr of 1.6  mg/dL (H)). Liver Function Tests: No results for input(s): AST, ALT, ALKPHOS, BILITOT, PROT, ALBUMIN in the last 168 hours. No results for input(s): LIPASE, AMYLASE in the last 168 hours. No results for input(s): AMMONIA in the last 168 hours. Coagulation Profile: No results for input(s): INR, PROTIME in the last 168 hours. Cardiac Enzymes: No results for input(s): CKTOTAL, CKMB, CKMBINDEX, TROPONINI in the last 168 hours. BNP (last 3 results) No results for input(s): PROBNP in the last 8760 hours. HbA1C: No results for input(s): HGBA1C in the last 72 hours. CBG: No results for input(s): GLUCAP in the last 168 hours. Lipid Profile: No results for input(s): CHOL, HDL, LDLCALC, TRIG, CHOLHDL, LDLDIRECT in the last 72 hours. Thyroid Function Tests: No results for input(s): TSH, T4TOTAL, FREET4, T3FREE, THYROIDAB in the last 72 hours. Anemia Panel: No results for input(s): VITAMINB12, FOLATE, FERRITIN, TIBC, IRON, RETICCTPCT in the last 72 hours. Urine analysis:  Component Value Date/Time   COLORURINE AMBER (A) 10/21/2020 1313   APPEARANCEUR HAZY (A) 10/21/2020 1313   LABSPEC 1.013 10/21/2020 1313   PHURINE 5.0 10/21/2020 1313   GLUCOSEU NEGATIVE 10/21/2020 1313   HGBUR SMALL (A) 10/21/2020 1313   BILIRUBINUR NEGATIVE 10/21/2020 [place] 10/21/2020 1313   PROTEINUR 30 (A) 10/21/2020 1313   NITRITE NEGATIVE 10/21/2020 1313   LEUKOCYTESUR NEGATIVE 10/21/2020 1313   Sepsis Labs: '@LABRCNTIP' (procalcitonin:4,lacticidven:4) ) Recent Results (from the past 240 hour(s))  Resp Panel by RT-PCR (Flu A&B, Covid) Nasopharyngeal Swab     Status: None   Collection Time: 10/21/20  4:27 PM   Specimen: Nasopharyngeal Swab; Nasopharyngeal(NP) swabs in vial transport medium  Result Value Ref Range Status   SARS Coronavirus 2 by RT PCR NEGATIVE NEGATIVE Final    Comment: (NOTE) SARS-CoV-2 target nucleic acids are NOT DETECTED.  The SARS-CoV-2 RNA is generally detectable in  upper respiratory specimens during the acute phase of infection. The lowest concentration of SARS-CoV-2 viral copies this assay can detect is 138 copies/mL. A negative result does not preclude SARS-Cov-2 infection and should not be used as the sole basis for treatment or other patient management decisions. A negative result may occur with  improper specimen collection/handling, submission of specimen other than nasopharyngeal swab, presence of viral mutation(s) within the areas targeted by this assay, and inadequate number of viral copies(<138 copies/mL). A negative result must be combined with clinical observations, patient history, and epidemiological information. The expected result is Negative.  Fact Sheet for Patients:  EntrepreneurPulse.com.au  Fact Sheet for Healthcare Providers:  IncredibleEmployment.be  This test is no t yet approved or cleared by the [place] FDA and  has been authorized for detection and/or diagnosis of SARS-CoV-2 by FDA under an Emergency Use Authorization (EUA). This EUA will remain  in effect (meaning this test can be used) for the duration of the COVID-19 declaration under Section 564(b)(1) of the Act, 21 U.S.C.section 360bbb-3(b)(1), unless the authorization is terminated  or revoked sooner.       Influenza A by PCR NEGATIVE NEGATIVE Final   Influenza B by PCR NEGATIVE NEGATIVE Final    Comment: (NOTE) The Xpert Xpress SARS-CoV-2/FLU/RSV plus assay is intended as an aid in the diagnosis of influenza from Nasopharyngeal swab specimens and should not be used as a sole basis for treatment. Nasal washings and aspirates are unacceptable for Xpert Xpress SARS-CoV-2/FLU/RSV testing.  Fact Sheet for Patients: EntrepreneurPulse.com.au  Fact Sheet for Healthcare Providers: IncredibleEmployment.be  This test is not yet approved or cleared by the [place] FDA and has been  authorized for detection and/or diagnosis of SARS-CoV-2 by FDA under an Emergency Use Authorization (EUA). This EUA will remain in effect (meaning this test can be used) for the duration of the COVID-19 declaration under Section 564(b)(1) of the Act, 21 U.S.C. section 360bbb-3(b)(1), unless the authorization is terminated or revoked.  Performed at [facility], [address] [place], [place] [address]      Radiological Exams on Admission: CT PELVIS W CONTRAST  Result Date: 10/21/2020 CLINICAL DATA:  Evaluate abscess versus cellulitis along medial aspect of left gluteal fold EXAM: CT PELVIS WITH CONTRAST TECHNIQUE: Multidetector CT imaging of the pelvis was performed using the standard protocol following the bolus administration of intravenous contrast. CONTRAST:  53m OMNIPAQUE IOHEXOL 350 MG/ML SOLN COMPARISON:  CT abdomen/pelvis 04/29/2020 FINDINGS: Urinary Tract:  No abnormality visualized. Bowel: Postsurgical changes reflecting Hartmann's pouch and right lower quadrant and right lower  quadrant ileostomy are seen. There is no abnormal bowel wall thickening or inflammatory change to the level imaged. Vascular/Lymphatic: There is mild calcified atherosclerotic plaque in the imaged abdominal aorta. The imaged venous structures appear patent. There is no lymphadenopathy in the pelvis. Reproductive: The right ovary is asymmetrically enlarged measuring 3.1 cm x 3.5 cm. There is a tubular hypodense structure in the right adnexa suspicious for hydrosalpinx. These findings are new since 04/29/2020. A calcified anterior uterine fibroid is again seen. There is no left adnexal mass. Other: Soft tissue nodularity is again seen along the left aspect of the omentum and mesenteric fat, incompletely imaged but also seen on the study from 04/29/2020. There is a 3.1 cm AP x 2.6 cm TV by 3.7 cm cc peripherally enhancing collection in the subcutaneous fat along the left aspect of the gluteal cleft. There  is inflammatory change in the surrounding fat and overlying skin thickening. Musculoskeletal: There is no acute osseous abnormality or aggressive osseous lesion. IMPRESSION: 1. 3.1 cm x 2.6 cm x 3.7 cm peripherally enhancing collection in the subcutaneous fat along the left aspect of the gluteal cleft consistent with abscess. There is surrounding inflammatory change and overlying skin thickening consistent with concomitant cellulitis. 2. Thickening and nodularity of the left omentum/mesentery again suspicious for metastatic disease, incompletely imaged but also present on the study from 04/29/2020. 3. Asymmetric enlargement of the right ovary with a tubular structure in the right adnexa suspicious for hydrosalpinx, new since 04/29/2020. Recommend further evaluation with pelvic ultrasound. Electronically Signed   By: Valetta Mole M.D.   On: 10/21/2020 15:59   DG Chest Portable 1 View  Result Date: 10/21/2020 CLINICAL DATA:  Mass in left buttocks causing discomfort and burning sensation. EXAM: PORTABLE CHEST 1 VIEW COMPARISON:  None. FINDINGS: A right-sided venous Port-A-Cath is seen with its distal tip noted approximate 1.4 cm proximal to the junction of the superior vena cava and right atrium. The heart size and mediastinal contours are within normal limits. Both lungs are clear. The visualized skeletal structures are unremarkable. IMPRESSION: No active disease. Electronically Signed   By: Virgina Norfolk M.D.   On: 10/21/2020 15:12     EKG: I have personally reviewed.  Sinus rhythm, QTC 440, low voltage, early R wave progression, tachycardia with heart rate of 121  Assessment/Plan Principal Problem:   Abscess, gluteal, left Active Problems:   RA (rheumatoid arthritis) (HCC)   Essential hypertension   History of colon cancer   Iron deficiency anemia   GERD (gastroesophageal reflux disease)   CKD (chronic kidney disease), stage IIIb   Sepsis ([place])    Sepsis due to abscess, gluteal, left:  Patient admits criteria for sepsis with WBC 31.5, tachycardia with heart rate of 128, lactic acid is normal.  Currently hemodynamically stable.  Dr. Lucila Maine of surgery is consulted, planning to do I&D today  - Admitted to MedSurg bed as inpatient - Empiric antimicrobial treatment with vancomycin, Flagyl, Rocephin - PRN Zofran for nausea, morphine and Percocet for pain - Blood cultures x 2  - ESR and CRP - will get Procalcitonin --> 3.10 - IVF: 2.5 L of LR bolus in ED, followed by 100 cc/h  RA (rheumatoid arthritis) ([place]): Taking prednisone 5 mg chronically -Give 50 mg of prednisone as a stress dose -Increase prednisone dose from 50m to 20 mg daily tomorrow  Essential hypertension: Patient's not taking medications currently -IV hydralazine as needed  History of colon cancer[facility]. Currently on chemotherapy, last dose was 2 weeks ago. -f/u with oncology  Iron deficiency anemia: Hemoglobin 10.9.  Patient not taking iron supplement currently -Follow-up with CBC  GERD (gastroesophageal reflux disease) -Protonix  CKD (chronic kidney disease), stage IIIb: Stable.  Baseline creatinine 1.7-1.9 recently.  Her creatinine is 1.6, BUN 17 -Follow-up with BMP   DVT ppx: SCD Code Status: Full code Family Communication:   Yes, patient's daughter at bed side Disposition Plan:  Anticipate discharge back to previous environment Consults called: Dr. Lucila Maine for surgery Admission status and Level of care: Med-Surg:  as inpt     Status is: Inpatient  Remains inpatient appropriate because:Inpatient level of care appropriate due to severity of illness  Dispo: The patient is from: Home              Anticipated d/c is to: Home              Patient currently is not medically stable to d/c.   Difficult to place patient No          Date of Service 10/21/2020    [facility]   If 7PM-7AM, please contact  night-coverage [email or web address] 10/21/2020, 6:08 PM

## 2020-10-21 NOTE — Transfer of Care (Signed)
Immediate Anesthesia Transfer of Care Note  Patient: Tanya Osborne  Procedure(s) Performed: INCISION AND DRAINAGE ABSCESS (Left)  Patient Location: PACU  Anesthesia Type:General  Level of Consciousness: drowsy  Airway & Oxygen Therapy: Patient Spontanous Breathing and Patient connected to face mask oxygen  Post-op Assessment: Report given to RN  Post vital signs: stable  Last Vitals:  Vitals Value Taken Time  BP    Temp    Pulse    Resp    SpO2      Last Pain:  Vitals:   10/21/20 1800  TempSrc: Oral  PainSc:          Complications: No notable events documented.

## 2020-10-22 ENCOUNTER — Encounter: Payer: Self-pay | Admitting: Surgery

## 2020-10-22 DIAGNOSIS — A419 Sepsis, unspecified organism: Secondary | ICD-10-CM | POA: Diagnosis not present

## 2020-10-22 DIAGNOSIS — E440 Moderate protein-calorie malnutrition: Secondary | ICD-10-CM | POA: Insufficient documentation

## 2020-10-22 DIAGNOSIS — N1832 Chronic kidney disease, stage 3b: Secondary | ICD-10-CM | POA: Diagnosis not present

## 2020-10-22 DIAGNOSIS — L0231 Cutaneous abscess of buttock: Secondary | ICD-10-CM | POA: Diagnosis not present

## 2020-10-22 LAB — LACTIC ACID, PLASMA: Lactic Acid, Venous: 1.6 mmol/L (ref 0.5–1.9)

## 2020-10-22 LAB — GLUCOSE, CAPILLARY
Glucose-Capillary: 189 mg/dL — ABNORMAL HIGH (ref 70–99)
Glucose-Capillary: 83 mg/dL (ref 70–99)

## 2020-10-22 LAB — BASIC METABOLIC PANEL
Anion gap: 10 (ref 5–15)
BUN: 16 mg/dL (ref 8–23)
CO2: 18 mmol/L — ABNORMAL LOW (ref 22–32)
Calcium: 8 mg/dL — ABNORMAL LOW (ref 8.9–10.3)
Chloride: 110 mmol/L (ref 98–111)
Creatinine, Ser: 1.54 mg/dL — ABNORMAL HIGH (ref 0.44–1.00)
GFR, Estimated: 38 mL/min — ABNORMAL LOW (ref >60)
Glucose, Bld: 70 mg/dL (ref 70–99)
Potassium: 3.9 mmol/L (ref 3.5–5.1)
Sodium: 138 mmol/L (ref 135–145)

## 2020-10-22 LAB — PROCALCITONIN: Procalcitonin: 8.58 ng/mL

## 2020-10-22 LAB — CBC
HCT: 26.4 % — ABNORMAL LOW (ref 36.0–46.0)
Hemoglobin: 8.7 g/dL — ABNORMAL LOW (ref 12.0–15.0)
MCH: 31.4 pg (ref 26.0–34.0)
MCHC: 33 g/dL (ref 30.0–36.0)
MCV: 95.3 fL (ref 80.0–100.0)
Platelets: 133 10*3/uL — ABNORMAL LOW (ref 150–400)
RBC: 2.77 MIL/uL — ABNORMAL LOW (ref 3.87–5.11)
RDW: 17.6 % — ABNORMAL HIGH (ref 11.5–15.5)
WBC: 28.6 10*3/uL — ABNORMAL HIGH (ref 4.0–10.5)
nRBC: 0 % (ref 0.0–0.2)

## 2020-10-22 LAB — MAGNESIUM: Magnesium: 2.1 mg/dL (ref 1.7–2.4)

## 2020-10-22 LAB — C-REACTIVE PROTEIN: CRP: 17.7 mg/dL — ABNORMAL HIGH (ref <1.0)

## 2020-10-22 MED ORDER — CALCIUM GLUCONATE-NACL 1-0.675 GM/50ML-% IV SOLN
1.0000 g | Freq: Once | INTRAVENOUS | Status: AC
  Administered 2020-10-22: 1000 mg via INTRAVENOUS
  Filled 2020-10-22: qty 50

## 2020-10-22 MED ORDER — PREDNISONE 10 MG PO TABS
5.0000 mg | ORAL_TABLET | Freq: Every day | ORAL | Status: DC
  Administered 2020-10-22 – 2020-10-29 (×8): 5 mg via ORAL
  Filled 2020-10-22 (×7): qty 1

## 2020-10-22 MED ORDER — PANTOPRAZOLE SODIUM 20 MG PO TBEC
20.0000 mg | DELAYED_RELEASE_TABLET | Freq: Two times a day (BID) | ORAL | Status: DC
  Administered 2020-10-22 – 2020-10-29 (×14): 20 mg via ORAL
  Filled 2020-10-22 (×15): qty 1

## 2020-10-22 MED ORDER — DEXTROSE 50 % IV SOLN
INTRAVENOUS | Status: AC
  Filled 2020-10-22: qty 50

## 2020-10-22 MED ORDER — ADULT MULTIVITAMIN W/MINERALS CH
1.0000 | ORAL_TABLET | Freq: Every day | ORAL | Status: DC
  Filled 2020-10-22: qty 1

## 2020-10-22 NOTE — Progress Notes (Signed)
Patient states she normally has tingling and numbness in fingers and toes but last couple of days has affected her legs.  She states she can barely move legs at this point and really doesn't feel them.  MD made aware.

## 2020-10-22 NOTE — Progress Notes (Signed)
Pt arrived to 118 via bed from ICU. Received report from [place], Therapist, sports. See assessment. Will continue to monitor.

## 2020-10-22 NOTE — Progress Notes (Signed)
   10/22/20 1300  Clinical Encounter Type  Visited With Patient and family together  Visit Type Follow-up;Spiritual support;Social support  Referral From Chaplain  Consult/Referral To [place] text;Prayer;Emotional   Chaplain visited with PT and daughter and ministered with intentional presence, prayer, and reflective listening. Chaplain had previously met PT when she was here on another visit in the ED. PT was anbe to express her feelings, and tell her health journey. Chaplain will follow up with PT tomorrow.  [place].

## 2020-10-22 NOTE — Plan of Care (Signed)
Patient is having pain issues d/t surgery.  MD aware.

## 2020-10-22 NOTE — Progress Notes (Signed)
Initial Nutrition Assessment  DOCUMENTATION CODES:  Non-severe (moderate) malnutrition in context of chronic illness  INTERVENTION:  Liberalize diet to regular Magic cup TID with meals, each supplement provides 290 kcal and 9 grams of protein Carnation Instant Breakfast TID- each packet provides 130kcal and 5g protein + milk MVI with minerals daily.  NUTRITION DIAGNOSIS:  Moderate Malnutrition (in the context of chronic illness) related to cancer and cancer related treatments as evidenced by  (mild/moderate muscle and fat depletions and severe weight loss of 14.2% x 6 months).  GOAL:  Patient will meet greater than or equal to 90% of their needs  MONITOR:  PO intake, Supplement acceptance, Labs  REASON FOR ASSESSMENT:  Malnutrition Screening Tool    ASSESSMENT:  60 y.o. female with hx of stage IV colon cancer (undergoing palliative chemo), s/p colectomy and end ileostomy, GERD, HLD, and CKD IV presented to ED from home for evaluation of increasing weakness and left-sided gluteal swelling.  Pt's daughter reports that mass on buttocks has appeared in the past ~1 week PTA and has increased in size and caused a painful burning sensation. Work up in ED showed elevated leukocytosis and exam/imaging worrisome for left gluteal abscess.   Surgery consulted and pt underwent I&D of the left buttocks 10/11 and a large volume of malodorous black-gray serous fluid was drained. Transferred to ICU after procedure due to tachycardia.   Pt resting in bed at the time of assessment. Pt reports that she is feeling better today and is tolerating her clear liquid diet. Reports that appetite is normally good at baseline and that she is hungry now. Discussed with surgery team, no further interventions planned and ok to advance pt to a regular diet.   Foley in place, urine looks quite concentrated. Discussed nutrition supplements as a way to increase fluid intake and nutrition. Pt reports that she has had  ensure in the past and she finds it to be too sweet.  Is agreeable to trying carnation instant breakfast and magic cup.  Bed weight reading much higher than weights have been at Oncology appointments recently. 14.2% weight loss noted using measured weight from [facility] records (04/29/20-10/15/20) which is severe. Muscle and fat deficits also present on exam.   Nutritionally Relevant Medications: Scheduled Meds:  cholecalciferol  2,000 Units Oral Daily   omega-3 acid ethyl esters  1 capsule Oral Daily   pantoprazole  20 mg Oral Daily   predniSONE  20 mg Oral Daily   predniSONE  50 mg Oral Q breakfast   Continuous Infusions:  lactated ringers 125 mL/hr at 10/22/20 0746   PRN Meds: ondansetron  Labs Reviewed: Creatinine 1.54  NUTRITION - FOCUSED PHYSICAL EXAM: Flowsheet Row Most Recent Value  Orbital Region Mild depletion  Upper Arm Region No depletion  Thoracic and Lumbar Region Moderate depletion  Buccal Region Mild depletion  Temple Region No depletion  Clavicle Bone Region Mild depletion  Clavicle and Acromion Bone Region Moderate depletion  Scapular Bone Region Moderate depletion  Dorsal Hand Mild depletion  Patellar Region Moderate depletion  Anterior Thigh Region Moderate depletion  Posterior Calf Region Moderate depletion  Edema (RD Assessment) None  Hair Reviewed  Eyes Reviewed  Mouth Reviewed  Skin Reviewed  Nails Reviewed    Diet Order:   Diet Order             Diet regular Room service appropriate? Yes; Fluid consistency: Thin  Diet effective now  EDUCATION NEEDS:  Education needs have been addressed  Skin:  Skin Assessment: Skin Integrity Issues: Skin Integrity Issues:: Incisions Incisions: rectal area  Last BM:  10/12 - 180mL of output via ileostomy  Height:  Ht Readings from Last 1 Encounters:  10/21/20 5\' 7"  (1.702 m)   Weight:  Wt Readings from Last 1 Encounters:  10/21/20 72.6 kg    Ideal Body Weight:   61.4 kg  BMI:  Body mass index is 25.07 kg/m.  Estimated Nutritional Needs:  Kcal:  1800-2000 kcal/d Protein:  90-100 g/d Fluid:  1.8-2L/d   Ranell Patrick, RD, LDN Clinical Dietitian RD pager # available in AMION  After hours/weekend pager # available in [facility]

## 2020-10-22 NOTE — Progress Notes (Signed)
Vital signs reviewed, ICU needs resolved BP (!) 101/57   Pulse (!) 106   Temp 98.9 F (37.2 C) (Oral)   Resp 20   Ht 5\' 7"  (1.702 m)   Wt 72.6 kg   SpO2 98%   BMI 25.07 kg/m    Will sign off at this time. No further recommendations at this time.  Please call [phone] for further questions. Thank you.    Corrin Parker, M.D.  [facility]  Medical Director [place] Director [facility] Cardio-Pulmonary Department

## 2020-10-22 NOTE — Progress Notes (Signed)
[facility] Day(s): 1.   Post op day(s): 1 Day Post-Op.   Interval History:  Patient seen and examined No acute events or new complaints overnight.  Patient reports she is very tired this morning Overall pain improved significantly No fever, chills Leukocytosis improved some, down to 28.6K Hgb to 8.7; likely dilutional Renal function stable; sCr - 1.54; UO - 800 ccs No significant electrolyte derangements Cx from OR growing GNR and GPC She continues on Vancomycin, Rocephin, and Flagyl  Vital signs in last 24 hours: [min-max] current  Temp:  [97.9 F (36.6 C)-99.2 F (37.3 C)] 98.9 F (37.2 C) (10/12 0400) Pulse Rate:  [106-132] 107 (10/12 0700) Resp:  [12-25] 19 (10/12 0700) BP: (101-171)/(57-104) 105/61 (10/12 0700) SpO2:  [95 %-100 %] 95 % (10/12 0700) Weight:  [65.8 kg-72.6 kg] 72.6 kg (10/11 1945)     Height: 5\' 7"  (170.2 cm) Weight: 72.6 kg BMI (Calculated): 25.06   Intake/Output last 2 shifts:  10/11 0701 - 10/12 0700 In: 300 [I.V.:300] Out: 900 [Urine:800; Stool:100]   Physical Exam:  Constitutional: alert, cooperative and no distress  Respiratory: breathing non-labored at rest  Cardiovascular: tachycardic and sinus rhythm  Genitourinary: Foley in place Integumentary: Perirectal I&D, left, packing in place   Labs:  CBC Latest Ref Rng & Units 10/22/2020 10/21/2020 10/21/2020  WBC 4.0 - 10.5 K/uL 28.6(H) 26.6(H) 31.5(H)  Hemoglobin 12.0 - 15.0 g/dL 8.7(L) 10.0(L) 10.9(L)  Hematocrit 36.0 - 46.0 % 26.4(L) 30.0(L) 31.9(L)  Platelets 150 - 400 K/uL 133(L) 147(L) 167   CMP Latest Ref Rng & Units 10/22/2020 10/21/2020 10/21/2020  Glucose 70 - 99 mg/dL 70 130(H) 90  BUN 8 - 23 mg/dL 16 17 17   Creatinine 0.44 - 1.00 mg/dL 1.54(H) 1.50(H) 1.60(H)  Sodium 135 - 145 mmol/L 138 137 141  Potassium 3.5 - 5.1 mmol/L 3.9 3.8 3.9  Chloride 98 - 111 mmol/L 110 109 112(H)  CO2 22 - 32 mmol/L 18(L) 17(L) 21(L)  Calcium 8.9  - 10.3 mg/dL 8.0(L) 8.1(L) 8.7(L)  Total Protein 6.5 - 8.1 g/dL - - -  Total Bilirubin 0.3 - 1.2 mg/dL - - -  Alkaline Phos 38 - 126 U/L - - -  AST 15 - 41 U/L - - -  ALT 0 - 44 U/L - - -     Imaging studies: No new pertinent imaging studies   Assessment/Plan:  60 y.o. female 1 Day Post-Op s/p incision and drainage for left perirectal abscess, complicated by pertinent comorbidities including colon CA slp colectomy, ileostomy, and adjuvant chemotherapy.Faythe Ghee for diet from surgical perspective  - Wound Care; Pack wound daily with vaginal packing -vs- Kerlix gauze, cover and secure. Change as needed.   - Okay to continue foley today for urinary management - IV Abx (Rocephin, Vancomycin, Flagyl); Cx growing GNR; GPC              - Pain control prn              - May need therapy eval after surgery given decline in functional status as of late             - Will order home health following OR for wound care if needed              - Further management per primary team; we will follow    All of the above findings and recommendations were discussed with the patient, and the medical  team, and all of patient's questions were answered to her expressed satisfaction.  -- Edison Simon, PA-C [facility] 10/22/2020, 7:34 AM [phone] M-F: 7am - 4pm

## 2020-10-22 NOTE — Progress Notes (Addendum)
Progress Note    Tanya Osborne  IWP:[id number] DOB: 08-15-1960  DOA: 10/21/2020 PCP: Annice Needy, MD      Brief Narrative:    Medical records reviewed and are as summarized below:  Tanya Osborne is a 60 y.o. female with medical history significant of rheumatoid arthritis on prednisone chronically, hypertension, hyperlipidemia, GERD, primary sclerosing cholangitis, CKD-3B, anemia, perforated duodenal ulcer, autoimmune liver disease, small bowel obstruction, colon cancer (s/p colectomy and end ileostomy at [facility] in February of 2020, on chemotherapy and recent dose was 2 weeks prior to admission).  She presented to the hospital because of pain in the left buttock.      Assessment/Plan:   Principal Problem:   Abscess, gluteal, left Active Problems:   RA (rheumatoid arthritis) (HCC)   Essential hypertension   History of colon cancer   Iron deficiency anemia   GERD (gastroesophageal reflux disease)   CKD (chronic kidney disease), stage IIIb   Sepsis (HCC)   Malnutrition of moderate degree   Nutrition Problem: Moderate Malnutrition (in the context of chronic illness) Etiology: cancer and cancer related treatments  Signs/Symptoms:  (mild/moderate muscle and fat depletions and severe weight loss of 14.2% x 6 months)   Body mass index is 25.07 kg/m.  Sepsis secondary to left perirectal/gluteal abscess, severe leukocytosis, immunocompromised state: MRSA screen was negative.  Discontinue IV vancomycin.  Continue IV ceftriaxone and IV Flagyl. Continue analgesics as needed for pain.  Monitor CBC.  CKD stage IIIb: Creatinine appears to be at baseline.  No evidence of AKI.  Rheumatoid arthritis, autoimmune liver disease, primary sclerosing cholangitis: Continue low-dose prednisone  Probable peripheral neuropathy: Analgesics as needed  Stage III colon cancer (February 2020) s/p 12 cycles of FOLFOX chemotherapy.  She received chemotherapy about 2 weeks prior to  admission. 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   Diet Order             Diet regular Room service appropriate? Yes; Fluid consistency: Thin  Diet effective now                      Consultants: General surgeon  Procedures: I&D of left perirectal abscess    Medications:    Chlorhexidine Gluconate Cloth  6 each Topical Q0600   cholecalciferol  2,000 Units Oral Daily   dextrose       multivitamin with minerals  1 tablet Oral Daily   omega-3 acid ethyl esters  1 capsule Oral Daily   pantoprazole  20 mg Oral Daily   predniSONE  5 mg Oral Daily   Continuous Infusions:  cefTRIAXone (ROCEPHIN)  IV     lactated ringers Stopped (10/21/20 2101)   metronidazole Stopped (10/22/20 0516)     Anti-infectives (From admission, onward)    Start     Dose/Rate Route Frequency Ordered Stop   10/22/20 1600  vancomycin (VANCOREADY) IVPB 750 mg/150 mL  Status:  Discontinued        750 mg 150 mL/hr over 60 Minutes Intravenous Every 24 hours 10/21/20 1701 10/22/20 1055   10/22/20 1400  cefTRIAXone (ROCEPHIN) 1 g in sodium chloride 0.9 % 100 mL IVPB        1 g 200 mL/hr over 30 Minutes Intravenous Every 24 hours 10/21/20 1652     10/21/20 1700  metroNIDAZOLE (FLAGYL) IVPB 500 mg        500 mg 100 mL/hr over 60 Minutes Intravenous Every 12  hours 10/21/20 1652     10/21/20 1500  vancomycin (VANCOREADY) IVPB 1500 mg/300 mL        1,500 mg 150 mL/hr over 120 Minutes Intravenous  Once 10/21/20 1442 10/21/20 2103   10/21/20 1445  cefTRIAXone (ROCEPHIN) 1 g in sodium chloride 0.9 % 100 mL IVPB        1 g 200 mL/hr over 30 Minutes Intravenous  Once 10/21/20 1442 10/21/20 1612              Family Communication/Anticipated D/C date and plan/Code Status   DVT prophylaxis: SCDs Start: 10/21/20 2036     Code Status: Full Code  Family Communication: None Disposition Plan:    Status is: Inpatient  Remains  inpatient appropriate because:IV treatments appropriate due to intensity of illness or inability to take PO  Dispo: The patient is from: Home              Anticipated d/c is to: Home              Patient currently is not medically stable to d/c.   Difficult to place patient No           Subjective:   Interval events noted.  C/o left buttock pain.  She also complains of tingling, numbness in her hands and feet which he attributes to neuropathy.  Overall, she feels a little better today.  Objective:    Vitals:   10/22/20 0600 10/22/20 0700 10/22/20 0800 10/22/20 0900  BP: (!) 101/57 105/61 107/65   Pulse: (!) 106 (!) 107 98 (!) 108  Resp:  19 (!) 27   Temp:   99.4 F (37.4 C)   TempSrc:   Oral   SpO2: 98% 95% 99% 100%  Weight:      Height:       No data found.   Intake/Output Summary (Last 24 hours) at 10/22/2020 1257 Last data filed at 10/22/2020 1021 Gross per 24 hour  Intake 2238.42 ml  Output 900 ml  Net 1338.42 ml   Filed Weights   10/21/20 1044 10/21/20 1945  Weight: 65.8 kg 72.6 kg    Exam:  GEN: NAD SKIN: Dressing on left buttock wound EYES: EOMI ENT: MMM CV: RRR PULM: CTA B ABD: soft, ND, NT, +BS CNS: AAO x 3, non focal EXT: No edema or tenderness        Data Reviewed:   I have personally reviewed following labs and imaging studies:  Labs: Labs show the following:   Basic Metabolic Panel: Recent Labs  Lab 10/21/20 1430 10/21/20 2029 10/22/20 0423  NA 141 137 138  K 3.9 3.8 3.9  CL 112* 109 110  CO2 21* 17* 18*  GLUCOSE 90 130* 70  BUN 17 17 16   CREATININE 1.60* 1.50* 1.54*  CALCIUM 8.7* 8.1* 8.0*  MG  --  1.4* 2.1   GFR Estimated Creatinine Clearance: 36.8 mL/min (A) (by C-G formula based on SCr of 1.54 mg/dL (H)). Liver Function Tests: No results for input(s): AST, ALT, ALKPHOS, BILITOT, PROT, ALBUMIN in the last 168 hours. No results for input(s): LIPASE, AMYLASE in the last 168 hours. No results for input(s):  AMMONIA in the last 168 hours. Coagulation profile Recent Labs  Lab 10/21/20 1430  INR 1.2    CBC: Recent Labs  Lab 10/21/20 1430 10/21/20 2029 10/22/20 0423  WBC 31.5* 26.6* 28.6*  HGB 10.9* 10.0* 8.7*  HCT 31.9* 30.0* 26.4*  MCV 94.1 94.6 95.3  PLT 167 147* 133*  Cardiac Enzymes: No results for input(s): CKTOTAL, CKMB, CKMBINDEX, TROPONINI in the last 168 hours. BNP (last 3 results) No results for input(s): PROBNP in the last 8760 hours. CBG: Recent Labs  Lab 10/21/20 1950 10/21/20 2046 10/22/20 0537 10/22/20 0843  GLUCAP 59* 93 189* 83   D-Dimer: No results for input(s): DDIMER in the last 72 hours. Hgb A1c: No results for input(s): HGBA1C in the last 72 hours. Lipid Profile: No results for input(s): CHOL, HDL, LDLCALC, TRIG, CHOLHDL, LDLDIRECT in the last 72 hours. Thyroid function studies: No results for input(s): TSH, T4TOTAL, T3FREE, THYROIDAB in the last 72 hours.  Invalid input(s): FREET3 Anemia work up: No results for input(s): VITAMINB12, FOLATE, FERRITIN, TIBC, IRON, RETICCTPCT in the last 72 hours. Sepsis Labs: Recent Labs  Lab 10/21/20 1430 10/21/20 1635 10/21/20 2029 10/21/20 2336 10/22/20 0423  PROCALCITON 3.10  --   --   --  8.58  WBC 31.5*  --  26.6*  --  28.6*  LATICACIDVEN 1.2 3.7* 5.4* 1.6  --     Microbiology Recent Results (from the past 240 hour(s))  Blood culture (single)     Status: None (Preliminary result)   Collection Time: 10/21/20  2:43 PM   Specimen: BLOOD  Result Value Ref Range Status   Specimen Description BLOOD PORTA CATH  Final   Special Requests   Final    BOTTLES DRAWN AEROBIC AND ANAEROBIC Blood Culture adequate volume   Culture   Final    NO GROWTH < 24 HOURS Performed at [facility], [address] [place],  [address]    Report Status PENDING  Incomplete  Resp Panel by RT-PCR (Flu A&B, Covid) Nasopharyngeal Swab     Status: None   Collection Time: 10/21/20  4:27 PM   Specimen:  Nasopharyngeal Swab; Nasopharyngeal(NP) swabs in vial transport medium  Result Value Ref Range Status   SARS Coronavirus 2 by RT PCR NEGATIVE NEGATIVE Final    Comment: (NOTE) SARS-CoV-2 target nucleic acids are NOT DETECTED.  The SARS-CoV-2 RNA is generally detectable in upper respiratory specimens during the acute phase of infection. The lowest concentration of SARS-CoV-2 viral copies this assay can detect is 138 copies/mL. A negative result does not preclude SARS-Cov-2 infection and should not be used as the sole basis for treatment or other patient management decisions. A negative result may occur with  improper specimen collection/handling, submission of specimen other than nasopharyngeal swab, presence of viral mutation(s) within the areas targeted by this assay, and inadequate number of viral copies(<138 copies/mL). A negative result must be combined with clinical observations, patient history, and epidemiological information. The expected result is Negative.  Fact Sheet for Patients:  EntrepreneurPulse.com.au  Fact Sheet for Healthcare Providers:  IncredibleEmployment.be  This test is no t yet approved or cleared by the [place] FDA and  has been authorized for detection and/or diagnosis of SARS-CoV-2 by FDA under an Emergency Use Authorization (EUA). This EUA will remain  in effect (meaning this test can be used) for the duration of the COVID-19 declaration under Section 564(b)(1) of the Act, 21 U.S.C.section 360bbb-3(b)(1), unless the authorization is terminated  or revoked sooner.       Influenza A by PCR NEGATIVE NEGATIVE Final   Influenza B by PCR NEGATIVE NEGATIVE Final    Comment: (NOTE) The Xpert Xpress SARS-CoV-2/FLU/RSV plus assay is intended as an aid in the diagnosis of influenza from Nasopharyngeal swab specimens and should not be used as a sole basis for treatment. Nasal  washings and aspirates are unacceptable for  Xpert Xpress SARS-CoV-2/FLU/RSV testing.  Fact Sheet for Patients: EntrepreneurPulse.com.au  Fact Sheet for Healthcare Providers: IncredibleEmployment.be  This test is not yet approved or cleared by the [place] FDA and has been authorized for detection and/or diagnosis of SARS-CoV-2 by FDA under an Emergency Use Authorization (EUA). This EUA will remain in effect (meaning this test can be used) for the duration of the COVID-19 declaration under Section 564(b)(1) of the Act, 21 U.S.C. section 360bbb-3(b)(1), unless the authorization is terminated or revoked.  Performed at [facility], [address] [place], [place] [address]   Aerobic/Anaerobic Culture w Gram Stain (surgical/deep wound)     Status: None (Preliminary result)   Collection Time: 10/21/20  6:40 PM   Specimen: PATH Other; Tissue  Result Value Ref Range Status   Specimen Description   Final    ABSCESS Performed at [facility], [address] [place], [place] [address]    Special Requests PERIRECTAL  Final   Gram Stain   Final    NO SQUAMOUS EPITHELIAL CELLS SEEN FEW WBC SEEN FEW GRAM NEGATIVE RODS MODERATE GRAM POSITIVE COCCI    Culture   Final    NO GROWTH < 12 HOURS Performed at [facility], [place] [address], [place], [place] [address]    Report Status PENDING  Incomplete  MRSA Next Gen by PCR, Nasal     Status: None   Collection Time: 10/21/20  7:46 PM   Specimen: Nasal Mucosa; Nasal Swab  Result Value Ref Range Status   MRSA by PCR Next Gen NOT DETECTED NOT DETECTED Final    Comment: (NOTE) The GeneXpert MRSA Assay (FDA approved for NASAL specimens only), is one component of a comprehensive MRSA colonization surveillance program. It is not intended to diagnose MRSA infection nor to guide or monitor treatment for MRSA infections. Test performance is not FDA approved in patients less than 17 years old. Performed at [facility], [address] [place],  [address]     Procedures and diagnostic studies:  CT PELVIS W CONTRAST  Result Date: 10/21/2020 CLINICAL DATA:  Evaluate abscess versus cellulitis along medial aspect of left gluteal fold EXAM: CT PELVIS WITH CONTRAST TECHNIQUE: Multidetector CT imaging of the pelvis was performed using the standard protocol following the bolus administration of intravenous contrast. CONTRAST:  36mL OMNIPAQUE IOHEXOL 350 MG/ML SOLN COMPARISON:  CT abdomen/pelvis 04/29/2020 FINDINGS: Urinary Tract:  No abnormality visualized. Bowel: Postsurgical changes reflecting Hartmann's pouch and right lower quadrant and right lower quadrant ileostomy are seen. There is no abnormal bowel wall thickening or inflammatory change to the level imaged. Vascular/Lymphatic: There is mild calcified atherosclerotic plaque in the imaged abdominal aorta. The imaged venous structures appear patent. There is no lymphadenopathy in the pelvis. Reproductive: The right ovary is asymmetrically enlarged measuring 3.1 cm x 3.5 cm. There is a tubular hypodense structure in the right adnexa suspicious for hydrosalpinx. These findings are new since 04/29/2020. A calcified anterior uterine fibroid is again seen. There is no left adnexal mass. Other: Soft tissue nodularity is again seen along the left aspect of the omentum and mesenteric fat, incompletely imaged but also seen on the study from 04/29/2020. There is a 3.1 cm AP x 2.6 cm TV by 3.7 cm cc peripherally enhancing collection in the subcutaneous fat along the left aspect of the gluteal cleft. There is inflammatory change in the surrounding fat and overlying skin thickening. Musculoskeletal: There is no acute  osseous abnormality or aggressive osseous lesion. IMPRESSION: 1. 3.1 cm x 2.6 cm x 3.7 cm peripherally enhancing collection in the subcutaneous fat along the left aspect of the gluteal cleft consistent with abscess. There is surrounding inflammatory change and  overlying skin thickening consistent with concomitant cellulitis. 2. Thickening and nodularity of the left omentum/mesentery again suspicious for metastatic disease, incompletely imaged but also present on the study from 04/29/2020. 3. Asymmetric enlargement of the right ovary with a tubular structure in the right adnexa suspicious for hydrosalpinx, new since 04/29/2020. Recommend further evaluation with pelvic ultrasound. Electronically Signed   By: Valetta Mole M.D.   On: 10/21/2020 15:59   DG Chest Portable 1 View  Result Date: 10/21/2020 CLINICAL DATA:  Mass in left buttocks causing discomfort and burning sensation. EXAM: PORTABLE CHEST 1 VIEW COMPARISON:  None. FINDINGS: A right-sided venous Port-A-Cath is seen with its distal tip noted approximate 1.4 cm proximal to the junction of the superior vena cava and right atrium. The heart size and mediastinal contours are within normal limits. Both lungs are clear. The visualized skeletal structures are unremarkable. IMPRESSION: No active disease. Electronically Signed   By: Virgina Norfolk M.D.   On: 10/21/2020 15:12               LOS: 1 day   Tonna Palazzi  [facility]   Pager on [email or web address]. If 7PM-7AM, please contact night-coverage at [email or web address]     10/22/2020, 12:57 PM

## 2020-10-23 DIAGNOSIS — N1832 Chronic kidney disease, stage 3b: Secondary | ICD-10-CM | POA: Diagnosis not present

## 2020-10-23 DIAGNOSIS — L0231 Cutaneous abscess of buttock: Secondary | ICD-10-CM | POA: Diagnosis not present

## 2020-10-23 LAB — CBC WITH DIFFERENTIAL/PLATELET
Abs Immature Granulocytes: 0.61 10*3/uL — ABNORMAL HIGH (ref 0.00–0.07)
Basophils Absolute: 0.1 10*3/uL (ref 0.0–0.1)
Basophils Relative: 0 %
Eosinophils Absolute: 0.1 10*3/uL (ref 0.0–0.5)
Eosinophils Relative: 0 %
HCT: 27.5 % — ABNORMAL LOW (ref 36.0–46.0)
Hemoglobin: 9.1 g/dL — ABNORMAL LOW (ref 12.0–15.0)
Immature Granulocytes: 2 %
Lymphocytes Relative: 3 %
Lymphs Abs: 0.9 10*3/uL (ref 0.7–4.0)
MCH: 31.3 pg (ref 26.0–34.0)
MCHC: 33.1 g/dL (ref 30.0–36.0)
MCV: 94.5 fL (ref 80.0–100.0)
Monocytes Absolute: 2.1 10*3/uL — ABNORMAL HIGH (ref 0.1–1.0)
Monocytes Relative: 7 %
Neutro Abs: 26.2 10*3/uL — ABNORMAL HIGH (ref 1.7–7.7)
Neutrophils Relative %: 88 %
Platelets: 130 10*3/uL — ABNORMAL LOW (ref 150–400)
RBC: 2.91 MIL/uL — ABNORMAL LOW (ref 3.87–5.11)
RDW: 17.4 % — ABNORMAL HIGH (ref 11.5–15.5)
WBC: 29.9 10*3/uL — ABNORMAL HIGH (ref 4.0–10.5)
nRBC: 0 % (ref 0.0–0.2)

## 2020-10-23 LAB — BASIC METABOLIC PANEL
Anion gap: 8 (ref 5–15)
BUN: 23 mg/dL (ref 8–23)
CO2: 21 mmol/L — ABNORMAL LOW (ref 22–32)
Calcium: 8.5 mg/dL — ABNORMAL LOW (ref 8.9–10.3)
Chloride: 110 mmol/L (ref 98–111)
Creatinine, Ser: 1.89 mg/dL — ABNORMAL HIGH (ref 0.44–1.00)
GFR, Estimated: 30 mL/min — ABNORMAL LOW (ref >60)
Glucose, Bld: 81 mg/dL (ref 70–99)
Potassium: 3.8 mmol/L (ref 3.5–5.1)
Sodium: 139 mmol/L (ref 135–145)

## 2020-10-23 LAB — GLUCOSE, CAPILLARY
Glucose-Capillary: 73 mg/dL (ref 70–99)
Glucose-Capillary: 131 mg/dL — ABNORMAL HIGH (ref 70–99)
Glucose-Capillary: 109 mg/dL — ABNORMAL HIGH (ref 70–99)

## 2020-10-23 LAB — PROCALCITONIN: Procalcitonin: 10.74 ng/mL

## 2020-10-23 NOTE — Progress Notes (Addendum)
Progress Note    Tanya Osborne  EVO:[id number] DOB: Sep 06, 1960  DOA: 10/21/2020 PCP: Annice Needy, MD      Brief Narrative:    Medical records reviewed and are as summarized below:  Tanya Osborne is a 60 y.o. female with medical history significant of rheumatoid arthritis on prednisone chronically, hypertension, hyperlipidemia, GERD, primary sclerosing cholangitis, CKD-IV, anemia, perforated duodenal ulcer, autoimmune liver disease, small bowel obstruction, colon cancer (s/p colectomy and end ileostomy at [facility] in February of 2020, on chemotherapy and recent dose was 2 weeks prior to admission).  She presented to the hospital because of pain in the left buttock.      Assessment/Plan:   Principal Problem:   Abscess, gluteal, left Active Problems:   RA (rheumatoid arthritis) (HCC)   Essential hypertension   History of colon cancer   Iron deficiency anemia   GERD (gastroesophageal reflux disease)   CKD (chronic kidney disease), stage IIIb   Sepsis (HCC)   Malnutrition of moderate degree   Nutrition Problem: Moderate Malnutrition (in the context of chronic illness) Etiology: cancer and cancer related treatments  Signs/Symptoms:  (mild/moderate muscle and fat depletions and severe weight loss of 14.2% x 6 months)   Body mass index is 25.07 kg/m.  Severe sepsis secondary to left perirectal/gluteal abscess, severe leukocytosis, immunocompromised state: Lactic acid was as high as 5.4.  Wound culture growing gram-negative rods and gram-positive cocci.  Continue IV ceftriaxone and IV Flagyl. Continue analgesics as needed for pain.  Monitor CBC.  CKD stage IV: Creatinine appears to be at baseline.  No evidence of AKI.  Rheumatoid arthritis, autoimmune liver disease, primary sclerosing cholangitis: Continue low-dose prednisone  Probable peripheral neuropathy: Analgesics as needed  Stage III colon cancer (February 2020) s/p 12 cycles of FOLFOX chemotherapy.  She  received chemotherapy about 2 weeks prior to admission. 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    Diet Order             Diet regular Room service appropriate? Yes; Fluid consistency: Thin  Diet effective now                      Consultants: General surgeon  Procedures: I&D of left perirectal abscess    Medications:    Chlorhexidine Gluconate Cloth  6 each Topical Q0600   cholecalciferol  2,000 Units Oral Daily   omega-3 acid ethyl esters  1 capsule Oral Daily   pantoprazole  20 mg Oral BID   predniSONE  5 mg Oral Daily   Continuous Infusions:  cefTRIAXone (ROCEPHIN)  IV Stopped (10/22/20 1417)   lactated ringers Stopped (10/21/20 2101)   metronidazole 500 mg (10/23/20 0407)     Anti-infectives (From admission, onward)    Start     Dose/Rate Route Frequency Ordered Stop   10/22/20 1600  vancomycin (VANCOREADY) IVPB 750 mg/150 mL  Status:  Discontinued        750 mg 150 mL/hr over 60 Minutes Intravenous Every 24 hours 10/21/20 1701 10/22/20 1055   10/22/20 1400  cefTRIAXone (ROCEPHIN) 1 g in sodium chloride 0.9 % 100 mL IVPB        1 g 200 mL/hr over 30 Minutes Intravenous Every 24 hours 10/21/20 1652     10/21/20 1700  metroNIDAZOLE (FLAGYL) IVPB 500 mg        500 mg 100 mL/hr over 60 Minutes Intravenous Every 12 hours 10/21/20 1652  10/21/20 1500  vancomycin (VANCOREADY) IVPB 1500 mg/300 mL        1,500 mg 150 mL/hr over 120 Minutes Intravenous  Once 10/21/20 1442 10/21/20 2103   10/21/20 1445  cefTRIAXone (ROCEPHIN) 1 g in sodium chloride 0.9 % 100 mL IVPB        1 g 200 mL/hr over 30 Minutes Intravenous  Once 10/21/20 1442 10/21/20 1612              Family Communication/Anticipated D/C date and plan/Code Status   DVT prophylaxis: SCDs Start: 10/21/20 2036     Code Status: Full Code  Family Communication: Plan discussed with Latoya, who was on speaker phone with her  mum during this encounter. Disposition Plan:    Status is: Inpatient  Remains inpatient appropriate because:IV treatments appropriate due to intensity of illness or inability to take PO  Dispo: The patient is from: Home              Anticipated d/c is to: Home              Patient currently is not medically stable to d/c.   Difficult to place patient No           Subjective:   Interval events noted.  She complains of pain in the left buttock but overall it's better  Objective:    Vitals:   10/23/20 0100 10/23/20 0115 10/23/20 0357 10/23/20 0854  BP: 136/76  (!) 149/86 (!) 141/79  Pulse: 79  84 89  Resp: 16  16 18   Temp: (!) 97.5 F (36.4 C) 97.7 F (36.5 C) 97.8 F (36.6 C) 97.7 F (36.5 C)  TempSrc: Oral Oral Oral   SpO2: 100%  100% 100%  Weight:      Height:       No data found.   Intake/Output Summary (Last 24 hours) at 10/23/2020 1252 Last data filed at 10/23/2020 1008 Gross per 24 hour  Intake 999.86 ml  Output 1275 ml  Net -275.14 ml   Filed Weights   10/21/20 1044 10/21/20 1945  Weight: 65.8 kg 72.6 kg    Exam:  GEN: NAD SKIN: Dressing on left buttock wound is clean, dry and intact EYES: No pallor or icterus ENT: MMM CV: RRR PULM: CTA B ABD: soft, ND, NT, +BS CNS: AAO x 3, non focal EXT: No edema or tenderness.  Rheumatoid hands            Data Reviewed:   I have personally reviewed following labs and imaging studies:  Labs: Labs show the following:   Basic Metabolic Panel: Recent Labs  Lab 10/21/20 1430 10/21/20 2029 10/22/20 0423 10/23/20 0606  NA 141 137 138 139  K 3.9 3.8 3.9 3.8  CL 112* 109 110 110  CO2 21* 17* 18* 21*  GLUCOSE 90 130* 70 81  BUN 17 17 16 23   CREATININE 1.60* 1.50* 1.54* 1.89*  CALCIUM 8.7* 8.1* 8.0* 8.5*  MG  --  1.4* 2.1  --    GFR Estimated Creatinine Clearance: 30 mL/min (A) (by C-G formula based on SCr of 1.89 mg/dL (H)). Liver Function Tests: No results for input(s): AST,  ALT, ALKPHOS, BILITOT, PROT, ALBUMIN in the last 168 hours. No results for input(s): LIPASE, AMYLASE in the last 168 hours. No results for input(s): AMMONIA in the last 168 hours. Coagulation profile Recent Labs  Lab 10/21/20 1430  INR 1.2    CBC: Recent Labs  Lab 10/21/20 1430 10/21/20 2029 10/22/20 0423  10/23/20 0606  WBC 31.5* 26.6* 28.6* 29.9*  NEUTROABS  --   --   --  26.2*  HGB 10.9* 10.0* 8.7* 9.1*  HCT 31.9* 30.0* 26.4* 27.5*  MCV 94.1 94.6 95.3 94.5  PLT 167 147* 133* 130*   Cardiac Enzymes: No results for input(s): CKTOTAL, CKMB, CKMBINDEX, TROPONINI in the last 168 hours. BNP (last 3 results) No results for input(s): PROBNP in the last 8760 hours. CBG: Recent Labs  Lab 10/21/20 1950 10/21/20 2046 10/22/20 0537 10/22/20 0843 10/23/20 0851  GLUCAP 59* 93 189* 83 73   D-Dimer: No results for input(s): DDIMER in the last 72 hours. Hgb A1c: No results for input(s): HGBA1C in the last 72 hours. Lipid Profile: No results for input(s): CHOL, HDL, LDLCALC, TRIG, CHOLHDL, LDLDIRECT in the last 72 hours. Thyroid function studies: No results for input(s): TSH, T4TOTAL, T3FREE, THYROIDAB in the last 72 hours.  Invalid input(s): FREET3 Anemia work up: No results for input(s): VITAMINB12, FOLATE, FERRITIN, TIBC, IRON, RETICCTPCT in the last 72 hours. Sepsis Labs: Recent Labs  Lab 10/21/20 1430 10/21/20 1635 10/21/20 2029 10/21/20 2336 10/22/20 0423 10/23/20 0606  PROCALCITON 3.10  --   --   --  8.58 10.74  WBC 31.5*  --  26.6*  --  28.6* 29.9*  LATICACIDVEN 1.2 3.7* 5.4* 1.6  --   --     Microbiology Recent Results (from the past 240 hour(s))  Blood culture (single)     Status: None (Preliminary result)   Collection Time: 10/21/20  2:43 PM   Specimen: BLOOD  Result Value Ref Range Status   Specimen Description BLOOD PORTA CATH  Final   Special Requests   Final    BOTTLES DRAWN AEROBIC AND ANAEROBIC Blood Culture adequate volume   Culture   Final     NO GROWTH 2 DAYS Performed at [facility], [address] [place], [place] [address]    Report Status PENDING  Incomplete  Resp Panel by RT-PCR (Flu A&B, Covid) Nasopharyngeal Swab     Status: None   Collection Time: 10/21/20  4:27 PM   Specimen: Nasopharyngeal Swab; Nasopharyngeal(NP) swabs in vial transport medium  Result Value Ref Range Status   SARS Coronavirus 2 by RT PCR NEGATIVE NEGATIVE Final    Comment: (NOTE) SARS-CoV-2 target nucleic acids are NOT DETECTED.  The SARS-CoV-2 RNA is generally detectable in upper respiratory specimens during the acute phase of infection. The lowest concentration of SARS-CoV-2 viral copies this assay can detect is 138 copies/mL. A negative result does not preclude SARS-Cov-2 infection and should not be used as the sole basis for treatment or other patient management decisions. A negative result may occur with  improper specimen collection/handling, submission of specimen other than nasopharyngeal swab, presence of viral mutation(s) within the areas targeted by this assay, and inadequate number of viral copies(<138 copies/mL). A negative result must be combined with clinical observations, patient history, and epidemiological information. The expected result is Negative.  Fact Sheet for Patients:  EntrepreneurPulse.com.au  Fact Sheet for Healthcare Providers:  IncredibleEmployment.be  This test is no t yet approved or cleared by the [place] FDA and  has been authorized for detection and/or diagnosis of SARS-CoV-2 by FDA under an Emergency Use Authorization (EUA). This EUA will remain  in effect (meaning this test can be used) for the duration of the COVID-19 declaration under Section 564(b)(1) of the Act, 21 U.S.C.section 360bbb-3(b)(1), unless the authorization is terminated  or revoked sooner.  Influenza A by PCR NEGATIVE NEGATIVE Final   Influenza B by PCR NEGATIVE NEGATIVE  Final    Comment: (NOTE) The Xpert Xpress SARS-CoV-2/FLU/RSV plus assay is intended as an aid in the diagnosis of influenza from Nasopharyngeal swab specimens and should not be used as a sole basis for treatment. Nasal washings and aspirates are unacceptable for Xpert Xpress SARS-CoV-2/FLU/RSV testing.  Fact Sheet for Patients: EntrepreneurPulse.com.au  Fact Sheet for Healthcare Providers: IncredibleEmployment.be  This test is not yet approved or cleared by the [place] FDA and has been authorized for detection and/or diagnosis of SARS-CoV-2 by FDA under an Emergency Use Authorization (EUA). This EUA will remain in effect (meaning this test can be used) for the duration of the COVID-19 declaration under Section 564(b)(1) of the Act, 21 U.S.C. section 360bbb-3(b)(1), unless the authorization is terminated or revoked.  Performed at [facility], [address], [place], [place] [address]   Aerobic/Anaerobic Culture w Gram Stain (surgical/deep wound)     Status: None (Preliminary result)   Collection Time: 10/21/20  6:40 PM   Specimen: PATH Other; Tissue  Result Value Ref Range Status   Specimen Description   Final    ABSCESS Performed at [facility], [address] [place], [place] [address]    Special Requests PERIRECTAL  Final   Gram Stain   Final    NO SQUAMOUS EPITHELIAL CELLS SEEN FEW WBC SEEN FEW GRAM NEGATIVE RODS MODERATE GRAM POSITIVE COCCI    Culture   Final    CULTURE REINCUBATED FOR BETTER GROWTH Performed at [facility], [place] [address], [place], [place] [address]    Report Status PENDING  Incomplete  MRSA Next Gen by PCR, Nasal     Status: None   Collection Time: 10/21/20  7:46 PM   Specimen: Nasal Mucosa; Nasal Swab  Result Value Ref Range Status   MRSA by PCR Next Gen NOT DETECTED NOT DETECTED Final    Comment: (NOTE) The GeneXpert MRSA Assay (FDA approved for NASAL specimens  only), is one component of a comprehensive MRSA colonization surveillance program. It is not intended to diagnose MRSA infection nor to guide or monitor treatment for MRSA infections. Test performance is not FDA approved in patients less than 16 years old. Performed at [facility], [address] [place], [place] [address]     Procedures and diagnostic studies:  CT PELVIS W CONTRAST  Result Date: 10/21/2020 CLINICAL DATA:  Evaluate abscess versus cellulitis along medial aspect of left gluteal fold EXAM: CT PELVIS WITH CONTRAST TECHNIQUE: Multidetector CT imaging of the pelvis was performed using the standard protocol following the bolus administration of intravenous contrast. CONTRAST:  28mL OMNIPAQUE IOHEXOL 350 MG/ML SOLN COMPARISON:  CT abdomen/pelvis 04/29/2020 FINDINGS: Urinary Tract:  No abnormality visualized. Bowel: Postsurgical changes reflecting Hartmann's pouch and right lower quadrant and right lower quadrant ileostomy are seen. There is no abnormal bowel wall thickening or inflammatory change to the level imaged. Vascular/Lymphatic: There is mild calcified atherosclerotic plaque in the imaged abdominal aorta. The imaged venous structures appear patent. There is no lymphadenopathy in the pelvis. Reproductive: The right ovary is asymmetrically enlarged measuring 3.1 cm x 3.5 cm. There is a tubular hypodense structure in the right adnexa suspicious for hydrosalpinx. These findings are new since 04/29/2020. A calcified anterior uterine fibroid is again seen. There is no left adnexal mass. Other: Soft tissue nodularity is again seen along the left aspect of the omentum and mesenteric fat, incompletely imaged but  also seen on the study from 04/29/2020. There is a 3.1 cm AP x 2.6 cm TV by 3.7 cm cc peripherally enhancing collection in the subcutaneous fat along the left aspect of the gluteal cleft. There is inflammatory change in the surrounding fat and overlying skin thickening.  Musculoskeletal: There is no acute osseous abnormality or aggressive osseous lesion. IMPRESSION: 1. 3.1 cm x 2.6 cm x 3.7 cm peripherally enhancing collection in the subcutaneous fat along the left aspect of the gluteal cleft consistent with abscess. There is surrounding inflammatory change and overlying skin thickening consistent with concomitant cellulitis. 2. Thickening and nodularity of the left omentum/mesentery again suspicious for metastatic disease, incompletely imaged but also present on the study from 04/29/2020. 3. Asymmetric enlargement of the right ovary with a tubular structure in the right adnexa suspicious for hydrosalpinx, new since 04/29/2020. Recommend further evaluation with pelvic ultrasound. Electronically Signed   By: Valetta Mole M.D.   On: 10/21/2020 15:59   DG Chest Portable 1 View  Result Date: 10/21/2020 CLINICAL DATA:  Mass in left buttocks causing discomfort and burning sensation. EXAM: PORTABLE CHEST 1 VIEW COMPARISON:  None. FINDINGS: A right-sided venous Port-A-Cath is seen with its distal tip noted approximate 1.4 cm proximal to the junction of the superior vena cava and right atrium. The heart size and mediastinal contours are within normal limits. Both lungs are clear. The visualized skeletal structures are unremarkable. IMPRESSION: No active disease. Electronically Signed   By: Virgina Norfolk M.D.   On: 10/21/2020 15:12               LOS: 2 days   Annistyn Depass  [facility]   Pager on [email or web address]. If 7PM-7AM, please contact night-coverage at [email or web address]     10/23/2020, 12:52 PM

## 2020-10-23 NOTE — Progress Notes (Signed)
[facility] Hospital Day(s): 2.   Post op day(s): 2 Days Post-Op.   Interval History:  Patient seen and examined No acute events or new complaints overnight.  Patient reports she is still sore but pain is overall improved Overall pain improved significantly No fever, chills Labs still need drawn this morning Cx from OR growing GNR and GPC She continues on Rocephin and Flagyl  Vital signs in last 24 hours: [min-max] current  Temp:  [97.5 F (36.4 C)-99.4 F (37.4 C)] 97.8 F (36.6 C) (10/13 0357) Pulse Rate:  [79-108] 84 (10/13 0357) Resp:  [16-35] 16 (10/13 0357) BP: (107-154)/(65-86) 149/86 (10/13 0357) SpO2:  [99 %-100 %] 100 % (10/13 0357)     Height: 5\' 7"  (170.2 cm) Weight: 72.6 kg BMI (Calculated): 25.06   Intake/Output last 2 shifts:  10/12 0701 - 10/13 0700 In: 2638.3 [P.O.:720; I.V.:1618.4; IV Piggyback:299.9] Out: 675 [Urine:375; Stool:300]   Physical Exam:  Constitutional: alert, cooperative and no distress  Respiratory: breathing non-labored at rest  Cardiovascular: tachycardic and sinus rhythm  Genitourinary: Foley in place Integumentary: Perirectal I&D, left, packing in place  Labs:  CBC Latest Ref Rng & Units 10/22/2020 10/21/2020 10/21/2020  WBC 4.0 - 10.5 K/uL 28.6(H) 26.6(H) 31.5(H)  Hemoglobin 12.0 - 15.0 g/dL 8.7(L) 10.0(L) 10.9(L)  Hematocrit 36.0 - 46.0 % 26.4(L) 30.0(L) 31.9(L)  Platelets 150 - 400 K/uL 133(L) 147(L) 167   CMP Latest Ref Rng & Units 10/22/2020 10/21/2020 10/21/2020  Glucose 70 - 99 mg/dL 70 130(H) 90  BUN 8 - 23 mg/dL 16 17 17   Creatinine 0.44 - 1.00 mg/dL 1.54(H) 1.50(H) 1.60(H)  Sodium 135 - 145 mmol/L 138 137 141  Potassium 3.5 - 5.1 mmol/L 3.9 3.8 3.9  Chloride 98 - 111 mmol/L 110 109 112(H)  CO2 22 - 32 mmol/L 18(L) 17(L) 21(L)  Calcium 8.9 - 10.3 mg/dL 8.0(L) 8.1(L) 8.7(L)  Total Protein 6.5 - 8.1 g/dL - - -  Total Bilirubin 0.3 - 1.2 mg/dL - - -  Alkaline Phos 38 - 126 U/L  - - -  AST 15 - 41 U/L - - -  ALT 0 - 44 U/L - - -     Imaging studies: No new pertinent imaging studies   Assessment/Plan:  60 y.o. female 2 Days Post-Op s/p incision and drainage for left perirectal abscess, complicated by pertinent comorbidities including colon CA slp colectomy, ileostomy, and adjuvant chemotherapy.   - Okay for diet from surgical perspective  - Wound Care; Pack wound daily with vaginal packing -vs- Kerlix gauze, cover and secure. Change as needed. --> Begin today (10/13)  - Okay to continue foley today for urinary management - IV Abx (Rocephin, Flagyl); Cx growing GNR; GPC              - Pain control prn              - May need therapy eval after surgery given decline in functional status as of late             - Will order home health following OR for wound care if needed              - Further management per primary team; we will follow    All of the above findings and recommendations were discussed with the patient, and the medical team, and all of patient's questions were answered to her expressed satisfaction.  -- Edison Simon, PA-C [facility] 10/23/2020, 7:45  AM [phone] M-F: 7am - 4pm

## 2020-10-23 NOTE — Progress Notes (Addendum)
Prior to perirectal drsg change, patient pre-medicated with Percocet and Morphine 2mg  IV.  Successfully removed approx 1 foot of vaginal packing placed in OR but unable to remove the remaining packing due to pt report of "unbearable pain" stating, "You have to stop. Tell them if they want to take it out they will have to put me to sleep."  Remaining OR packing covered with 4x4 gauze and ABD, secured with paper tape.

## 2020-10-23 NOTE — Evaluation (Signed)
Physical Therapy Evaluation Patient Details Name: Tanya Osborne MRN: [id number] DOB: 17-Mar-1960 Today's Date: 10/23/2020  History of Present Illness  60 y.o with significant PMH of stage III colon cancer s/p total colectomy and ileostomy in February, SBO secondary to postsurgical adhesion, perforated duodenal ulcer and chronic stage IV kidney disease who presented to the ED with chief complaints of left gluteal mass associated with the discomfort and burning sensation. S/P I &D 10/21/20   Clinical Impression  Patient received in bed, she is hesitant to move due to pain. Patient assisted to sitting partially upright on edge of bed needs mod assist. Pain limited. She requires max +2 for rolling in bed due to pain.  Patient will continue to benefit from skilled PT while here to improve functional mobility and independence.         Recommendations for follow up therapy are one component of a multi-disciplinary discharge planning process, led by the attending physician.  Recommendations may be updated based on patient status, additional functional criteria and insurance authorization.  Follow Up Recommendations SNF    Equipment Recommendations  Other (comment) (TBD)    Recommendations for Other Services       Precautions / Restrictions Restrictions Weight Bearing Restrictions: No      Mobility  Bed Mobility Overal bed mobility: Needs Assistance Bed Mobility: Rolling;Sidelying to Sit;Sit to Sidelying;Sit to Supine;Supine to Sit Rolling: Max assist;+2 for physical assistance Sidelying to sit: Mod assist;HOB elevated Supine to sit: Mod assist;HOB elevated Sit to supine: Mod assist Sit to sidelying: Mod assist General bed mobility comments: patient pain limited with mobility. Requires increased time and mod assist.    Transfers                 General transfer comment: unable  Ambulation/Gait             General Gait Details: unable at this time  Stairs             Wheelchair Mobility    Modified Rankin (Stroke Patients Only)       Balance Overall balance assessment: Needs assistance Sitting-balance support: Feet unsupported;Single extremity supported Sitting balance-Leahy Scale: Fair Sitting balance - Comments: unable to get fully seated on edge of bed. Leaning on her right side/elbow                                     Pertinent Vitals/Pain Pain Assessment: Faces Faces Pain Scale: Hurts worst Pain Descriptors / Indicators: Discomfort;Grimacing;Guarding;Moaning Pain Intervention(s): Limited activity within patient's tolerance;Monitored during session;Premedicated before session;Repositioned    Home Living Family/patient expects to be discharged to:: Private residence Living Arrangements: Children Available Help at Discharge: Family;Available PRN/intermittently           Home Equipment: None      Prior Function Level of Independence: Independent         Comments: Patient reports she was independent and active prior to this.     Hand Dominance        Extremity/Trunk Assessment   Upper Extremity Assessment Upper Extremity Assessment: Overall WFL for tasks assessed    Lower Extremity Assessment Lower Extremity Assessment: RLE deficits/detail;LLE deficits/detail RLE Deficits / Details: patient reports she has neuropathy and legs are very sensitive RLE: Unable to fully assess due to pain LLE Deficits / Details: Bilateral neuropathy and pain LLE: Unable to fully assess due to pain  Communication   Communication: No difficulties  Cognition Arousal/Alertness: Awake/alert Behavior During Therapy: WFL for tasks assessed/performed Overall Cognitive Status: Within Functional Limits for tasks assessed                                        General Comments      Exercises     Assessment/Plan    PT Assessment Patient needs continued PT services  PT Problem List Decreased  strength;Decreased mobility;Pain;Decreased activity tolerance       PT Treatment Interventions DME instruction;Therapeutic activities;Gait training;Therapeutic exercise;Functional mobility training;Patient/family education    PT Goals (Current goals can be found in the Care Plan section)  Acute Rehab PT Goals Patient Stated Goal: to decrease pain PT Goal Formulation: With patient Time For Goal Achievement: 11/06/20 Potential to Achieve Goals: Fair    Frequency Min 2X/week   Barriers to discharge Decreased caregiver support;Inaccessible home environment      Co-evaluation               AM-PAC PT "6 Clicks" Mobility  Outcome Measure Help needed turning from your back to your side while in a flat bed without using bedrails?: A Lot Help needed moving from lying on your back to sitting on the side of a flat bed without using bedrails?: A Lot Help needed moving to and from a bed to a chair (including a wheelchair)?: Total Help needed standing up from a chair using your arms (e.g., wheelchair or bedside chair)?: Total Help needed to walk in hospital room?: Total Help needed climbing 3-5 steps with a railing? : Total 6 Click Score: 8    End of Session   Activity Tolerance: Patient limited by pain Patient left: in bed;with nursing/sitter in room Nurse Communication: Mobility status PT Visit Diagnosis: Other abnormalities of gait and mobility (R26.89);Pain Pain - Right/Left:  (buttock, legs)    Time: 1400-1435 PT Time Calculation (min) (ACUTE ONLY): 35 min   Charges:   PT Evaluation $PT Eval Moderate Complexity: 1 Mod PT Treatments $Therapeutic Activity: 8-22 mins        Laretha Luepke, PT, GCS 10/23/20,2:52 PM

## 2020-10-24 DIAGNOSIS — A419 Sepsis, unspecified organism: Secondary | ICD-10-CM | POA: Diagnosis not present

## 2020-10-24 DIAGNOSIS — L0231 Cutaneous abscess of buttock: Secondary | ICD-10-CM | POA: Diagnosis not present

## 2020-10-24 LAB — BASIC METABOLIC PANEL
Anion gap: 7 (ref 5–15)
BUN: 26 mg/dL — ABNORMAL HIGH (ref 8–23)
CO2: 21 mmol/L — ABNORMAL LOW (ref 22–32)
Calcium: 8.4 mg/dL — ABNORMAL LOW (ref 8.9–10.3)
Chloride: 111 mmol/L (ref 98–111)
Creatinine, Ser: 1.95 mg/dL — ABNORMAL HIGH (ref 0.44–1.00)
GFR, Estimated: 29 mL/min — ABNORMAL LOW (ref >60)
Glucose, Bld: 100 mg/dL — ABNORMAL HIGH (ref 70–99)
Potassium: 3.5 mmol/L (ref 3.5–5.1)
Sodium: 139 mmol/L (ref 135–145)

## 2020-10-24 LAB — CBC WITH DIFFERENTIAL/PLATELET
Abs Immature Granulocytes: 0.48 10*3/uL — ABNORMAL HIGH (ref 0.00–0.07)
Basophils Absolute: 0.1 10*3/uL (ref 0.0–0.1)
Basophils Relative: 0 %
Eosinophils Absolute: 0.1 10*3/uL (ref 0.0–0.5)
Eosinophils Relative: 0 %
HCT: 27.1 % — ABNORMAL LOW (ref 36.0–46.0)
Hemoglobin: 8.8 g/dL — ABNORMAL LOW (ref 12.0–15.0)
Immature Granulocytes: 2 %
Lymphocytes Relative: 4 %
Lymphs Abs: 0.9 10*3/uL (ref 0.7–4.0)
MCH: 29.9 pg (ref 26.0–34.0)
MCHC: 32.5 g/dL (ref 30.0–36.0)
MCV: 92.2 fL (ref 80.0–100.0)
Monocytes Absolute: 1 10*3/uL (ref 0.1–1.0)
Monocytes Relative: 4 %
Neutro Abs: 21 10*3/uL — ABNORMAL HIGH (ref 1.7–7.7)
Neutrophils Relative %: 90 %
Platelets: 150 10*3/uL (ref 150–400)
RBC: 2.94 MIL/uL — ABNORMAL LOW (ref 3.87–5.11)
RDW: 17.6 % — ABNORMAL HIGH (ref 11.5–15.5)
WBC: 23.5 10*3/uL — ABNORMAL HIGH (ref 4.0–10.5)
nRBC: 0 % (ref 0.0–0.2)

## 2020-10-24 LAB — GLUCOSE, CAPILLARY
Glucose-Capillary: 83 mg/dL (ref 70–99)
Glucose-Capillary: 114 mg/dL — ABNORMAL HIGH (ref 70–99)

## 2020-10-24 MED ORDER — HYDROMORPHONE HCL 1 MG/ML IJ SOLN
1.0000 mg | Freq: Once | INTRAMUSCULAR | Status: AC
  Administered 2020-10-24: 1 mg via INTRAVENOUS
  Filled 2020-10-24: qty 1

## 2020-10-24 NOTE — Progress Notes (Signed)
[facility] Day(s): 3.   Post op day(s): 3 Days Post-Op.   Interval History:  Patient seen and examined No acute events or new complaints overnight.  Patient reports she had a tough time with dressing change yesterday given the pain despite being pre-medicated No fever, chills Her leukocytosis has made improvements this morning; down to 23.5K Renal function remains elevated; sCr - 1.95; UO - 150 ccs Cx from OR growing GNR and GPC She continues on Rocephin and Flagyl  Vital signs in last 24 hours: [min-max] current  Temp:  [97.5 F (36.4 C)-97.9 F (36.6 C)] 97.9 F (36.6 C) (10/14 0407) Pulse Rate:  [80-88] 88 (10/14 0407) Resp:  [16-20] 18 (10/14 0407) BP: (140-155)/(77-89) 141/81 (10/14 0407) SpO2:  [100 %] 100 % (10/14 0407)     Height: 5\' 7"  (170.2 cm) Weight: 72.6 kg BMI (Calculated): 25.06   Intake/Output last 2 shifts:  10/13 0701 - 10/14 0700 In: 660 [P.O.:360; IV Piggyback:300] Out: 450 [Urine:150; Stool:300]   Physical Exam:  Constitutional: alert, cooperative and no distress  Respiratory: breathing non-labored at rest  Cardiovascular: tachycardic and sinus rhythm  Genitourinary: purewick present Integumentary: Left perirectal I&D; packing removed, visible wound bed appears healthy, this is expectedly sore, no significant erythema, no further evidence of necrosis or abscess  Labs:  CBC Latest Ref Rng & Units 10/24/2020 10/23/2020 10/22/2020  WBC 4.0 - 10.5 K/uL 23.5(H) 29.9(H) 28.6(H)  Hemoglobin 12.0 - 15.0 g/dL 8.8(L) 9.1(L) 8.7(L)  Hematocrit 36.0 - 46.0 % 27.1(L) 27.5(L) 26.4(L)  Platelets 150 - 400 K/uL 150 130(L) 133(L)   CMP Latest Ref Rng & Units 10/24/2020 10/23/2020 10/22/2020  Glucose 70 - 99 mg/dL 100(H) 81 70  BUN 8 - 23 mg/dL 26(H) 23 16  Creatinine 0.44 - 1.00 mg/dL 1.95(H) 1.89(H) 1.54(H)  Sodium 135 - 145 mmol/L 139 139 138  Potassium 3.5 - 5.1 mmol/L 3.5 3.8 3.9  Chloride 98 - 111 mmol/L  111 110 110  CO2 22 - 32 mmol/L 21(L) 21(L) 18(L)  Calcium 8.9 - 10.3 mg/dL 8.4(L) 8.5(L) 8.0(L)  Total Protein 6.5 - 8.1 g/dL - - -  Total Bilirubin 0.3 - 1.2 mg/dL - - -  Alkaline Phos 38 - 126 U/L - - -  AST 15 - 41 U/L - - -  ALT 0 - 44 U/L - - -     Imaging studies: No new pertinent imaging studies   Assessment/Plan:  60 y.o. female 3 Days Post-Op s/p incision and drainage for left perirectal abscess, complicated by pertinent comorbidities including colon CA slp colectomy, ileostomy, and adjuvant chemotherapy.   - Okay for diet from surgical perspective  - Wound Care; Pack wound daily with vaginal packing -vs- Kerlix gauze, cover and secure. Change as needed. --> I was able to change this with assistance of bedside RN  - Monitor renal function; UO  - IV Abx (Rocephin, Flagyl); Cx growing GNR; GPC              - Pain control prn              - Worked with therapy; recommending SNF             - Will order home health following OR for wound care if patient elects for home             - Further management per primary team; we will follow    All of the above findings and recommendations  were discussed with the patient, and the medical team, and all of patient's questions were answered to her expressed satisfaction.  -- Edison Simon, PA-C [facility] 10/24/2020, 9:19 AM [phone] M-F: 7am - 4pm

## 2020-10-24 NOTE — TOC Initial Note (Signed)
Transition of Care [facility]) - Initial/Assessment Note    Patient Details  Name: Tanya Osborne MRN: [id number] Date of Birth: [birth year]  Transition of Care [facility]) CM/SW Contact:    Shelbie Hutching, RN Phone Number: 10/24/2020, 3:14 PM  Clinical Narrative:                 Patient admitted to the hospital with a gluteal abcess requiring surgical I and D under anesthesia.  Patient now has packing and dressing changes.  RNCM met with patient at the bedside and she called and put her 2 daughters on speaker phone so we all talked about discharge planning.  Patient is from home where she lives with her daughter, she reports being independent and able to drive before this abscess.  PT is currently recommending SNF.  Daughters and patient do not want SNF and would prefer for patient to go home with home health services.  They will also need a hospital bed, rollator, and bedside commode.  Daughter works from home and has been caring for her mother for years now, she is confident about being able to help her with dressing changes and caring for her at home.   RNCM has given the referral for [facility] RN, PT, and OT to [facility]- waiting for response on if they can accept.   DME will be ordered from [facility] and delivered to patient's home before discharge.   TOC to cont to follow.    Expected Discharge Plan: [place] Barriers to Discharge: Continued Medical Work up   Patient Goals and CMS Choice Patient states their goals for this hospitalization and ongoing recovery are:: Patient wants to go home with home health and get better CMS Medicare.gov Compare Post Acute Care list provided to:: Patient Choice offered to / list presented to : Patient, Adult Children  Expected Discharge Plan and Services Expected Discharge Plan: [place]   Discharge Planning Services: CM Consult Post Acute Care Choice: [place] arrangements for the past 2 months: [place]                  DME Arranged: Hospital bed, 3-N-1, Walker rolling with seat DME Agency: [facility] Date DME Agency Contacted: 10/24/20 Time DME Agency Contacted: [id number] Representative spoke with at DME Agency: Suanne Marker HH Arranged: RN, PT, OT [facility] Agency: [place] ([place]) Date HH Agency Contacted: 10/24/20 Time [place]: 46 Representative spoke with at [place]: [place] Arrangements/Services Living arrangements for the past 2 months: [place] Lives with:: Adult Children Patient language and need for interpreter reviewed:: Yes Do you feel safe going back to the place where you live?: Yes      Need for Family Participation in Patient Care: Yes (Comment) (wound) Care giver support system in place?: Yes (comment) (daughters)   Criminal Activity/Legal Involvement Pertinent to Current Situation/Hospitalization: No - Comment as needed  Activities of Daily Living Home Assistive Devices/Equipment: Ostomy supplies ADL Screening (condition at time of admission) Patient's cognitive ability adequate to safely complete daily activities?: Yes Is the patient deaf or have difficulty hearing?: No Does the patient have difficulty seeing, even when wearing glasses/contacts?: No Does the patient have difficulty concentrating, remembering, or making decisions?: No Patient able to express need for assistance with ADLs?: Yes Does the patient have difficulty dressing or bathing?: Yes Independently performs ADLs?: No Communication: Independent Dressing (OT): Needs assistance Is this a change from baseline?: Pre-admission baseline Grooming:  Needs assistance Is this a change from baseline?: Pre-admission baseline Feeding: Needs assistance Is this a change from baseline?: Pre-admission baseline Bathing: Needs assistance Is this a change from baseline?: Pre-admission baseline Toileting: Needs assistance Is this a change from baseline?: Pre-admission  baseline In/Out Bed: Needs assistance Is this a change from baseline?: Pre-admission baseline Walks in Home: Needs assistance Is this a change from baseline?: Pre-admission baseline Does the patient have difficulty walking or climbing stairs?: Yes Weakness of Legs: Both Weakness of Arms/Hands: Both  Permission Sought/Granted Permission sought to share information with : Case Manager, Family Supports, Other (comment) Permission granted to share information with : Yes, Verbal Permission Granted  Share Information with NAME: Latoya and [name]  Permission granted to share info w AGENCY: Home health and [facility]  Permission granted to share info w Relationship: daughters  Permission granted to share info w Contact Information: [phone] and [phone]  Emotional Assessment Appearance:: Appears older than stated age Attitude/Demeanor/Rapport: Engaged Affect (typically observed): Accepting Orientation: : Oriented to Self, Oriented to Place, Oriented to  Time, Oriented to Situation Alcohol / Substance Use: Not Applicable Psych Involvement: No (comment)  Admission diagnosis:  Dehydration [E86.0] Abscess [L02.91] Generalized weakness [R53.1] Abscess, gluteal, left [L02.31] Sepsis without acute organ dysfunction, due to unspecified organism [facility]) [A41.9] Patient Active Problem List   Diagnosis Date Noted   Malnutrition of moderate degree 10/22/2020   Abscess, gluteal, left 10/21/2020   Iron deficiency anemia    GERD (gastroesophageal reflux disease)    CKD (chronic kidney disease), stage IIIb    Sepsis (HCC)    Small bowel obstruction (HCC) 04/29/2020   RA (rheumatoid arthritis) (HCC)    Essential hypertension    CKD (chronic kidney disease) stage 3, GFR 30-59 ml/min ([place])    History of colon cancer    PCP:  Annice Needy, MD Pharmacy:   [facility] [phone] - [address], [address] [place] [place] [address] Phone:  [phone] Fax: [phone]     Social Determinants of Health (SDOH) Interventions    Readmission Risk Interventions No flowsheet data found.

## 2020-10-24 NOTE — Progress Notes (Signed)
Patient agreeable to surgical I&D site dressing change this AM by Otho Ket, PA-C with assistance of RN. Patient pre-medicated. Packing removed and dressing change completed successfully. No other altered skin integrity noted. Pt tolerated well.

## 2020-10-24 NOTE — Progress Notes (Signed)
Physical Therapy Treatment Patient Details Name: Tanya Osborne MRN: [id number] DOB: February 26, 1960 Today's Date: 10/24/2020   History of Present Illness 60 y.o with significant PMH of stage III colon cancer s/p total colectomy and ileostomy in February, SBO secondary to postsurgical adhesion, perforated duodenal ulcer and chronic stage IV kidney disease who presented to the ED with chief complaints of left gluteal mass associated with the discomfort and burning sensation. S/P I &D 10/21/20    PT Comments    Pt was quarter turned L for offloading upon arriving. She is alert and agreeable to session with encouragement. Pt does state that she felt dressing change went well earlier this date. She still endorses sever pain however pain did not limit session progression. Pt is very anxious with mobility but with increased time is able to perform task with assistance. Pt was unwilling to ambulate away from EOB however did take a few side steps from foot of bed > head of bed. Overall much improved session from previous date. Pt will need to be pre-medicated prior to all PT sessions. Highly recommend SNF at [place] to address deficits while maximizing independence with ADLs.    Recommendations for follow up therapy are one component of a multi-disciplinary discharge planning process, led by the attending physician.  Recommendations may be updated based on patient status, additional functional criteria and insurance authorization.  Follow Up Recommendations  SNF     Equipment Recommendations  Other (comment) (Defer to next level of care)       Precautions / Restrictions Restrictions Weight Bearing Restrictions: No     Mobility  Bed Mobility Overal bed mobility: Needs Assistance Bed Mobility: Rolling;Sidelying to Sit;Sit to Sidelying;Sit to Supine;Supine to Sit Rolling: Min assist;Mod assist Sidelying to sit: Mod assist Supine to sit: Mod assist Sit to supine: Min assist (assiting LEs into bed) Sit  to sidelying: Min assist General bed mobility comments: pt requires assistance to safely exit and re-enter bed. ALOT of increased time due to pain and anxiety    Transfers Overall transfer level: Needs assistance Equipment used: Rolling walker (2 wheeled) Transfers: Sit to/from Stand Sit to Stand: From elevated surface;Min assist         General transfer comment: Pt requires more encouragement than physical assistance however does still require min assist to achieve full upright standing. was able to take 3 side steps from FOB to [facility] with alot of increased time.Anxiety greatly limits session progression. pt unwilling to attempt steps away from EOB.  Ambulation/Gait  Gait velocity: decreased   General Gait Details: Pt took ~ 3 side steps along EOB to reposition to [facility] prior to returning to supine.      Balance Overall balance assessment: Needs assistance Sitting-balance support: Feet supported;Bilateral upper extremity supported Sitting balance-Leahy Scale: Fair Sitting balance - Comments: Once seated EOB was able to maintain balance with CGA for safety. Mostly needs cues for relaxation.   Standing balance support: Bilateral upper extremity supported;During functional activity Standing balance-Leahy Scale: Fair Standing balance comment: reliant on BUE support to maintain balance.        Cognition Arousal/Alertness: Awake/alert Behavior During Therapy: Anxious Overall Cognitive Status: Within Functional Limits for tasks assessed      General Comments: Pt is A and O x 3. Agrees to session with encouragement. Was pleased for tolerated dressing change earlier in the day. EDducated on rehab recommendation and oriented her to situation.         General Comments General comments (skin integrity, edema, etc.):  Pt is somewhat self limiting but overall did put forth effort to achieve increased mobility. Does report she was able to "walk like you." a few weeks prior. will need  continued PT to progress pt to PLOF      Pertinent Vitals/Pain Pain Assessment: 0-10 Pain Score: 7  Faces Pain Scale: Hurts even more Pain Location: wound pain Pain Descriptors / Indicators: Discomfort;Grimacing;Guarding;Moaning Pain Intervention(s): Limited activity within patient's tolerance;Monitored during session;Premedicated before session;Repositioned     PT Goals (current goals can now be found in the care plan section) Acute Rehab PT Goals Patient Stated Goal: to decrease pain Progress towards PT goals: Progressing toward goals    Frequency    Min 2X/week      PT Plan Current plan remains appropriate       AM-PAC PT "6 Clicks" Mobility   Outcome Measure  Help needed turning from your back to your side while in a flat bed without using bedrails?: A Lot Help needed moving from lying on your back to sitting on the side of a flat bed without using bedrails?: A Lot Help needed moving to and from a bed to a chair (including a wheelchair)?: A Lot Help needed standing up from a chair using your arms (e.g., wheelchair or bedside chair)?: A Lot Help needed to walk in hospital room?: Total Help needed climbing 3-5 steps with a railing? : Total 6 Click Score: 10    End of Session Equipment Utilized During Treatment: Gait belt Activity Tolerance: Patient limited by pain;Other (comment) (limited by anxiety with mobility) Patient left: in bed;with call bell/phone within reach;with bed alarm set (quarter turned for pressure relief) Nurse Communication: Mobility status PT Visit Diagnosis: Other abnormalities of gait and mobility (R26.89);Pain     Time: 2355-7322 PT Time Calculation (min) (ACUTE ONLY): 24 min  Charges:  $Therapeutic Activity: 23-37 mins                     Julaine Fusi PTA 10/24/20, 3:12 PM

## 2020-10-24 NOTE — Progress Notes (Signed)
Patient requested to have ileostomy appliance emptied. Emptied and cleansed per requested. Offered to fully change appliance, as it was visible soiled, however patient declined stating her daughter would bring her home appliances.

## 2020-10-24 NOTE — Progress Notes (Signed)
    Durable Medical Equipment  (From admission, onward)           Start     Ordered   10/24/20 1523  For home use only DME Hospital bed  Once       Question Answer Comment  Length of Need 6 Months   Patient has (list medical condition): Gluteal abcess left buttock open wound with packing   The above medical condition requires: Patient requires the ability to reposition frequently   Head must be elevated greater than: 30 degrees   Bed type Semi-electric   Support Surface: Gel Overlay      10/24/20 1526   10/24/20 1522  For home use only DME 3 n 1  Once        10/24/20 1526   10/24/20 1522  For home use only DME 4 wheeled rolling walker with seat  Once       Question:  Patient needs a walker to treat with the following condition  Answer:  Weakness   10/24/20 1526

## 2020-10-24 NOTE — Care Management Important Message (Signed)
Important Message  Patient Details  Name: Tanya Osborne MRN: [id number] Date of Birth: 11-Oct-1960   Medicare Important Message Given:  Yes     [name] 10/24/2020, 11:42 AM

## 2020-10-24 NOTE — Progress Notes (Signed)
Progress Note    Tanya Osborne  EUM:[id number] DOB: 1960/08/29  DOA: 10/21/2020 PCP: Annice Needy, MD      Brief Narrative:    Medical records reviewed and are as summarized below:  Tanya Osborne is a 60 y.o. female with medical history significant of rheumatoid arthritis on prednisone chronically, hypertension, hyperlipidemia, GERD, primary sclerosing cholangitis, CKD-IV, anemia, perforated duodenal ulcer, autoimmune liver disease, small bowel obstruction, colon cancer (s/p colectomy and end ileostomy at [facility] in February of 2020, on chemotherapy and recent dose was 2 weeks prior to admission).  She presented to the hospital because of pain in the left buttock.      Assessment/Plan:   Principal Problem:   Abscess, gluteal, left Active Problems:   RA (rheumatoid arthritis) (HCC)   Essential hypertension   History of colon cancer   Iron deficiency anemia   GERD (gastroesophageal reflux disease)   CKD (chronic kidney disease), stage IIIb   Sepsis (HCC)   Malnutrition of moderate degree   Nutrition Problem: Moderate Malnutrition (in the context of chronic illness) Etiology: cancer and cancer related treatments  Signs/Symptoms:  (mild/moderate muscle and fat depletions and severe weight loss of 14.2% x 6 months)   Body mass index is 25.07 kg/m.  Severe sepsis secondary to left perirectal/gluteal abscess, severe leukocytosis, immunocompromised state: Lactic acid was as high as 5.4.  Wound culture growing gram-negative rods, gram-positive cocci, abundant Bacteroides fragilis.  Continue empiric IV antibiotics and analgesics as needed for pain.  WBC still high at 23.5 but is trending down.  CKD stage IV: Creatinine is trending down but she is still around her baseline.  No evidence of AKI.  Rheumatoid arthritis, autoimmune liver disease, primary sclerosing cholangitis: Continue low-dose prednisone  Probable peripheral neuropathy: Analgesics as needed  Stage  III colon cancer (February 2020) s/p 12 cycles of FOLFOX chemotherapy.  She received chemotherapy about 2 weeks prior to admission. 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    Diet Order             Diet regular Room service appropriate? Yes; Fluid consistency: Thin  Diet effective now                      Consultants: General surgeon  Procedures: I&D of left perirectal abscess    Medications:    Chlorhexidine Gluconate Cloth  6 each Topical Q0600   cholecalciferol  2,000 Units Oral Daily   omega-3 acid ethyl esters  1 capsule Oral Daily   pantoprazole  20 mg Oral BID   predniSONE  5 mg Oral Daily   Continuous Infusions:  cefTRIAXone (ROCEPHIN)  IV Stopped (10/23/20 1342)   lactated ringers Stopped (10/21/20 2101)   metronidazole 500 mg (10/24/20 0551)     Anti-infectives (From admission, onward)    Start     Dose/Rate Route Frequency Ordered Stop   10/22/20 1600  vancomycin (VANCOREADY) IVPB 750 mg/150 mL  Status:  Discontinued        750 mg 150 mL/hr over 60 Minutes Intravenous Every 24 hours 10/21/20 1701 10/22/20 1055   10/22/20 1400  cefTRIAXone (ROCEPHIN) 1 g in sodium chloride 0.9 % 100 mL IVPB        1 g 200 mL/hr over 30 Minutes Intravenous Every 24 hours 10/21/20 1652     10/21/20 1700  metroNIDAZOLE (FLAGYL) IVPB 500 mg        500 mg 100  mL/hr over 60 Minutes Intravenous Every 12 hours 10/21/20 1652     10/21/20 1500  vancomycin (VANCOREADY) IVPB 1500 mg/300 mL        1,500 mg 150 mL/hr over 120 Minutes Intravenous  Once 10/21/20 1442 10/21/20 2103   10/21/20 1445  cefTRIAXone (ROCEPHIN) 1 g in sodium chloride 0.9 % 100 mL IVPB        1 g 200 mL/hr over 30 Minutes Intravenous  Once 10/21/20 1442 10/21/20 1612              Family Communication/Anticipated D/C date and plan/Code Status   DVT prophylaxis: SCDs Start: 10/21/20 2036     Code Status: Full Code  Family  Communication: None   Status is: Inpatient  Remains inpatient appropriate because:IV treatments appropriate due to intensity of illness or inability to take PO  Dispo: The patient is from: Home              Anticipated d/c is to: Home              Patient currently is not medically stable to d/c.   Difficult to place patient No           Subjective:   Interval events noted.  She complains of left buttock pain.  She said she had a left buttock wound dressing this morning.  Objective:    Vitals:   10/23/20 2014 10/24/20 0058 10/24/20 0407 10/24/20 0900  BP: (!) 155/89 140/77 (!) 141/81 140/78  Pulse: 80 80 88 80  Resp: 16 16 18 18   Temp: 97.7 F (36.5 C) 97.8 F (36.6 C) 97.9 F (36.6 C) 98.2 F (36.8 C)  TempSrc: Oral  Oral Oral  SpO2: 100% 100% 100% 100%  Weight:      Height:       No data found.   Intake/Output Summary (Last 24 hours) at 10/24/2020 1041 Last data filed at 10/24/2020 0435 Gross per 24 hour  Intake 420 ml  Output 450 ml  Net -30 ml   Filed Weights   10/21/20 1044 10/21/20 1945  Weight: 65.8 kg 72.6 kg    Exam:  GEN: NAD SKIN: Warm and dry.  Dressing on left buttock wound is clean, dry and intact EYES: No pallor or icterus ENT: MMM CV: RRR PULM: CTA B ABD: soft, ND, NT, +BS CNS: AAO x 3, non focal EXT: No edema or tenderness            Data Reviewed:   I have personally reviewed following labs and imaging studies:  Labs: Labs show the following:   Basic Metabolic Panel: Recent Labs  Lab 10/21/20 1430 10/21/20 2029 10/22/20 0423 10/23/20 0606 10/24/20 0600  NA 141 137 138 139 139  K 3.9 3.8 3.9 3.8 3.5  CL 112* 109 110 110 111  CO2 21* 17* 18* 21* 21*  GLUCOSE 90 130* 70 81 100*  BUN 17 17 16 23  26*  CREATININE 1.60* 1.50* 1.54* 1.89* 1.95*  CALCIUM 8.7* 8.1* 8.0* 8.5* 8.4*  MG  --  1.4* 2.1  --   --    GFR Estimated Creatinine Clearance: 29.1 mL/min (A) (by C-G formula based on SCr of 1.95 mg/dL  (H)). Liver Function Tests: No results for input(s): AST, ALT, ALKPHOS, BILITOT, PROT, ALBUMIN in the last 168 hours. No results for input(s): LIPASE, AMYLASE in the last 168 hours. No results for input(s): AMMONIA in the last 168 hours. Coagulation profile Recent Labs  Lab 10/21/20 1430  INR 1.2    CBC: Recent Labs  Lab 10/21/20 1430 10/21/20 2029 10/22/20 0423 10/23/20 0606 10/24/20 0600  WBC 31.5* 26.6* 28.6* 29.9* 23.5*  NEUTROABS  --   --   --  26.2* 21.0*  HGB 10.9* 10.0* 8.7* 9.1* 8.8*  HCT 31.9* 30.0* 26.4* 27.5* 27.1*  MCV 94.1 94.6 95.3 94.5 92.2  PLT 167 147* 133* 130* 150   Cardiac Enzymes: No results for input(s): CKTOTAL, CKMB, CKMBINDEX, TROPONINI in the last 168 hours. BNP (last 3 results) No results for input(s): PROBNP in the last 8760 hours. CBG: Recent Labs  Lab 10/22/20 0843 10/23/20 0851 10/23/20 1718 10/23/20 2016 10/24/20 0749  GLUCAP 83 73 131* 109* 83   D-Dimer: No results for input(s): DDIMER in the last 72 hours. Hgb A1c: No results for input(s): HGBA1C in the last 72 hours. Lipid Profile: No results for input(s): CHOL, HDL, LDLCALC, TRIG, CHOLHDL, LDLDIRECT in the last 72 hours. Thyroid function studies: No results for input(s): TSH, T4TOTAL, T3FREE, THYROIDAB in the last 72 hours.  Invalid input(s): FREET3 Anemia work up: No results for input(s): VITAMINB12, FOLATE, FERRITIN, TIBC, IRON, RETICCTPCT in the last 72 hours. Sepsis Labs: Recent Labs  Lab 10/21/20 1430 10/21/20 1635 10/21/20 2029 10/21/20 2336 10/22/20 0423 10/23/20 0606 10/24/20 0600  PROCALCITON 3.10  --   --   --  8.58 10.74  --   WBC 31.5*  --  26.6*  --  28.6* 29.9* 23.5*  LATICACIDVEN 1.2 3.7* 5.4* 1.6  --   --   --     Microbiology Recent Results (from the past 240 hour(s))  Blood culture (single)     Status: None (Preliminary result)   Collection Time: 10/21/20  2:43 PM   Specimen: BLOOD  Result Value Ref Range Status   Specimen Description  BLOOD PORTA CATH  Final   Special Requests   Final    BOTTLES DRAWN AEROBIC AND ANAEROBIC Blood Culture adequate volume   Culture   Final    NO GROWTH 3 DAYS Performed at [facility], [address], [place], [place] [address]    Report Status PENDING  Incomplete  Resp Panel by RT-PCR (Flu A&B, Covid) Nasopharyngeal Swab     Status: None   Collection Time: 10/21/20  4:27 PM   Specimen: Nasopharyngeal Swab; Nasopharyngeal(NP) swabs in vial transport medium  Result Value Ref Range Status   SARS Coronavirus 2 by RT PCR NEGATIVE NEGATIVE Final    Comment: (NOTE) SARS-CoV-2 target nucleic acids are NOT DETECTED.  The SARS-CoV-2 RNA is generally detectable in upper respiratory specimens during the acute phase of infection. The lowest concentration of SARS-CoV-2 viral copies this assay can detect is 138 copies/mL. A negative result does not preclude SARS-Cov-2 infection and should not be used as the sole basis for treatment or other patient management decisions. A negative result may occur with  improper specimen collection/handling, submission of specimen other than nasopharyngeal swab, presence of viral mutation(s) within the areas targeted by this assay, and inadequate number of viral copies(<138 copies/mL). A negative result must be combined with clinical observations, patient history, and epidemiological information. The expected result is Negative.  Fact Sheet for Patients:  EntrepreneurPulse.com.au  Fact Sheet for Healthcare Providers:  IncredibleEmployment.be  This test is no t yet approved or cleared by the [place] FDA and  has been authorized for detection and/or diagnosis of SARS-CoV-2 by FDA under an Emergency Use Authorization (EUA). This EUA will remain  in effect (meaning this  test can be used) for the duration of the COVID-19 declaration under Section 564(b)(1) of the Act, 21 U.S.C.section 360bbb-3(b)(1), unless the  authorization is terminated  or revoked sooner.       Influenza A by PCR NEGATIVE NEGATIVE Final   Influenza B by PCR NEGATIVE NEGATIVE Final    Comment: (NOTE) The Xpert Xpress SARS-CoV-2/FLU/RSV plus assay is intended as an aid in the diagnosis of influenza from Nasopharyngeal swab specimens and should not be used as a sole basis for treatment. Nasal washings and aspirates are unacceptable for Xpert Xpress SARS-CoV-2/FLU/RSV testing.  Fact Sheet for Patients: EntrepreneurPulse.com.au  Fact Sheet for Healthcare Providers: IncredibleEmployment.be  This test is not yet approved or cleared by the [place] FDA and has been authorized for detection and/or diagnosis of SARS-CoV-2 by FDA under an Emergency Use Authorization (EUA). This EUA will remain in effect (meaning this test can be used) for the duration of the COVID-19 declaration under Section 564(b)(1) of the Act, 21 U.S.C. section 360bbb-3(b)(1), unless the authorization is terminated or revoked.  Performed at [facility], [address], [place], [place] [address]   Aerobic/Anaerobic Culture w Gram Stain (surgical/deep wound)     Status: None (Preliminary result)   Collection Time: 10/21/20  6:40 PM   Specimen: PATH Other; Tissue  Result Value Ref Range Status   Specimen Description   Final    ABSCESS Performed at [facility], [address] [place], [place] [address]    Special Requests PERIRECTAL  Final   Gram Stain   Final    NO SQUAMOUS EPITHELIAL CELLS SEEN FEW WBC SEEN FEW GRAM NEGATIVE RODS MODERATE GRAM POSITIVE COCCI    Culture   Final    CULTURE REINCUBATED FOR BETTER GROWTH Performed at [facility], [place] [address] [place], [place] [address]    Report Status PENDING  Incomplete  MRSA Next Gen by PCR, Nasal     Status: None   Collection Time: 10/21/20  7:46 PM   Specimen: Nasal Mucosa; Nasal Swab  Result Value Ref Range Status    MRSA by PCR Next Gen NOT DETECTED NOT DETECTED Final    Comment: (NOTE) The GeneXpert MRSA Assay (FDA approved for NASAL specimens only), is one component of a comprehensive MRSA colonization surveillance program. It is not intended to diagnose MRSA infection nor to guide or monitor treatment for MRSA infections. Test performance is not FDA approved in patients less than 53 years old. Performed at [facility], [address] [place],  [address]     Procedures and diagnostic studies:  No results found.             LOS: 3 days   Maryl Blalock  [facility]   Pager on [email or web address]. If 7PM-7AM, please contact night-coverage at [email or web address]     10/24/2020, 10:41 AM

## 2020-10-24 NOTE — Progress Notes (Signed)
0600 patient refused to have CBG bath and perirectal wound dressing changed. Patient stated ,'I don't feel like a bath now I just got comfortable. My wound don't do that dressing change I will have to be put to sleep to have it done you tell doctor I said so'. Patient's daughter last evening had stated patient was itching but while she was telling me that the patient was showing me pictures of her past wounds and talking of the care for those. Throughout the shift  the patient never complained of itching.

## 2020-10-25 DIAGNOSIS — L0231 Cutaneous abscess of buttock: Secondary | ICD-10-CM | POA: Diagnosis not present

## 2020-10-25 LAB — CBC WITH DIFFERENTIAL/PLATELET
Abs Immature Granulocytes: 0.71 10*3/uL — ABNORMAL HIGH (ref 0.00–0.07)
Basophils Absolute: 0.1 10*3/uL (ref 0.0–0.1)
Basophils Relative: 0 %
Eosinophils Absolute: 0.1 10*3/uL (ref 0.0–0.5)
Eosinophils Relative: 1 %
HCT: 26.5 % — ABNORMAL LOW (ref 36.0–46.0)
Hemoglobin: 8.8 g/dL — ABNORMAL LOW (ref 12.0–15.0)
Immature Granulocytes: 4 %
Lymphocytes Relative: 5 %
Lymphs Abs: 1 10*3/uL (ref 0.7–4.0)
MCH: 30.9 pg (ref 26.0–34.0)
MCHC: 33.2 g/dL (ref 30.0–36.0)
MCV: 93 fL (ref 80.0–100.0)
Monocytes Absolute: 1.6 10*3/uL — ABNORMAL HIGH (ref 0.1–1.0)
Monocytes Relative: 8 %
Neutro Abs: 15.8 10*3/uL — ABNORMAL HIGH (ref 1.7–7.7)
Neutrophils Relative %: 82 %
Platelets: 151 10*3/uL (ref 150–400)
RBC: 2.85 MIL/uL — ABNORMAL LOW (ref 3.87–5.11)
RDW: 17.7 % — ABNORMAL HIGH (ref 11.5–15.5)
WBC: 19.3 10*3/uL — ABNORMAL HIGH (ref 4.0–10.5)
nRBC: 0.2 % (ref 0.0–0.2)

## 2020-10-25 LAB — BASIC METABOLIC PANEL
Anion gap: 6 (ref 5–15)
BUN: 29 mg/dL — ABNORMAL HIGH (ref 8–23)
CO2: 21 mmol/L — ABNORMAL LOW (ref 22–32)
Calcium: 8.4 mg/dL — ABNORMAL LOW (ref 8.9–10.3)
Chloride: 113 mmol/L — ABNORMAL HIGH (ref 98–111)
Creatinine, Ser: 1.85 mg/dL — ABNORMAL HIGH (ref 0.44–1.00)
GFR, Estimated: 30 mL/min — ABNORMAL LOW (ref >60)
Glucose, Bld: 80 mg/dL (ref 70–99)
Potassium: 3.8 mmol/L (ref 3.5–5.1)
Sodium: 140 mmol/L (ref 135–145)

## 2020-10-25 LAB — AEROBIC/ANAEROBIC CULTURE W GRAM STAIN (SURGICAL/DEEP WOUND): Gram Stain: NONE SEEN

## 2020-10-25 LAB — GLUCOSE, CAPILLARY
Glucose-Capillary: 68 mg/dL — ABNORMAL LOW (ref 70–99)
Glucose-Capillary: 74 mg/dL (ref 70–99)

## 2020-10-25 NOTE — Progress Notes (Signed)
Hypoglycemic Event  CBG: 68  Treatment: 120 ml orange juice  Symptoms: none  Follow-up CBG: Time: 0913   CBG Result: 74  Possible Reasons for Event: unknown  Comments/MD notified: pt treated with hypoglycemia protocol    Raciel Caffrey Silvestre Moment

## 2020-10-25 NOTE — Progress Notes (Signed)
[facility] Day(s): 4.   Post op day(s): 4 Days Post-Op.   Interval History:  Patient seen and examined No acute events or new complaints overnight.  Patient reports dressing changes not been completed yet today, she has become passively uncooperative. No fever, chills Her leukocytosis has made improvements again today.   Renal function remains elevated; sCr - 1.85; UO - 800 ccs Cx from OR growing GNR and GPC She continues on Rocephin and Flagyl  Vital signs in last 24 hours: [min-max] current  Temp:  [98.1 F (36.7 C)-98.2 F (36.8 C)] 98.1 F (36.7 C) (10/15 0751) Pulse Rate:  [68-79] 79 (10/15 0751) Resp:  [16] 16 (10/15 0751) BP: (157-163)/(78-88) 157/78 (10/15 0751) SpO2:  [100 %] 100 % (10/15 0751)     Height: 5\' 7"  (170.2 cm) Weight: 72.6 kg BMI (Calculated): 25.06   Intake/Output last 2 shifts:  10/14 0701 - 10/15 0700 In: -  Out: 1100 [Urine:800; Stool:300]   Physical Exam:  Constitutional: alert, and no distress  Respiratory: breathing non-labored at rest  Cardiovascular: tachycardic and sinus rhythm  Genitourinary: purewick present Integumentary: Left perirectal I&D; no significant erythema, no further evidence of necrosis or abscess  Labs:  CBC Latest Ref Rng & Units 10/25/2020 10/24/2020 10/23/2020  WBC 4.0 - 10.5 K/uL 19.3(H) 23.5(H) 29.9(H)  Hemoglobin 12.0 - 15.0 g/dL 8.8(L) 8.8(L) 9.1(L)  Hematocrit 36.0 - 46.0 % 26.5(L) 27.1(L) 27.5(L)  Platelets 150 - 400 K/uL 151 150 130(L)   CMP Latest Ref Rng & Units 10/25/2020 10/24/2020 10/23/2020  Glucose 70 - 99 mg/dL 80 100(H) 81  BUN 8 - 23 mg/dL 29(H) 26(H) 23  Creatinine 0.44 - 1.00 mg/dL 1.85(H) 1.95(H) 1.89(H)  Sodium 135 - 145 mmol/L 140 139 139  Potassium 3.5 - 5.1 mmol/L 3.8 3.5 3.8  Chloride 98 - 111 mmol/L 113(H) 111 110  CO2 22 - 32 mmol/L 21(L) 21(L) 21(L)  Calcium 8.9 - 10.3 mg/dL 8.4(L) 8.4(L) 8.5(L)  Total Protein 6.5 - 8.1 g/dL - - -   Total Bilirubin 0.3 - 1.2 mg/dL - - -  Alkaline Phos 38 - 126 U/L - - -  AST 15 - 41 U/L - - -  ALT 0 - 44 U/L - - -     Imaging studies: No new pertinent imaging studies   Assessment/Plan:  60 y.o. female 4 Days Post-Op s/p incision and drainage for left perirectal abscess, complicated by pertinent comorbidities including colon CA slp colectomy, ileostomy, and adjuvant chemotherapy.   - Okay for diet from surgical perspective  - Wound Care; Pack wound daily with vaginal packing -vs- Kerlix gauze, cover and secure. Change as needed.   - Monitor renal function; UO  - IV Abx (Rocephin, Flagyl); Cx growing GNR; GPC              - Pain control prn              - Worked with therapy; recommending SNF             - Will order home health following OR for wound care if patient elects for home             - Further management per primary team; we will follow     -- Ronny Bacon, M.D., [facility]  10/25/2020 ; 4:05 PM

## 2020-10-25 NOTE — Progress Notes (Addendum)
Progress Note    Tanya Osborne  VCB:[id number] DOB: 28-May-1960  DOA: 10/21/2020 PCP: Annice Needy, MD      Brief Narrative:    Medical records reviewed and are as summarized below:  Tanya Osborne is a 60 y.o. female with medical history significant of rheumatoid arthritis on prednisone chronically, hypertension, hyperlipidemia, GERD, primary sclerosing cholangitis, CKD-IV, anemia, perforated duodenal ulcer, autoimmune liver disease, small bowel obstruction, colon cancer (s/p colectomy and end ileostomy at [facility] in February of 2020, on chemotherapy and recent dose was 2 weeks prior to admission).  She presented to the hospital because of pain in the left buttock.      Assessment/Plan:   Principal Problem:   Abscess, gluteal, left Active Problems:   RA (rheumatoid arthritis) (HCC)   Essential hypertension   History of colon cancer   Iron deficiency anemia   GERD (gastroesophageal reflux disease)   CKD (chronic kidney disease), stage IIIb   Sepsis (HCC)   Malnutrition of moderate degree   Nutrition Problem: Moderate Malnutrition (in the context of chronic illness) Etiology: cancer and cancer related treatments  Signs/Symptoms:  (mild/moderate muscle and fat depletions and severe weight loss of 14.2% x 6 months)   Body mass index is 25.07 kg/m.  Severe sepsis secondary to left perirectal/gluteal abscess, severe leukocytosis, immunocompromised state: Lactic acid was as high as 5.4.  Wound culture growing gram-negative rods, gram-positive cocci, abundant Bacteroides fragilis.  Continue empiric IV antibiotics and adjust antibiotics based on final culture results.  WBC is trending down.    CKD stage IV: Creatinine is stable.  Rheumatoid arthritis, autoimmune liver disease, primary sclerosing cholangitis: Continue low-dose prednisone  Probable peripheral neuropathy: Analgesics as needed  Stage III colon cancer (February 2020) s/p 12 cycles of FOLFOX  chemotherapy.  She received chemotherapy about 2 weeks prior to admission. 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  Patient and daughter prefer to go home with home health therapy rather than go to SNF.  However, patient feels she is not ready to go home yet because she is still very weak.  She has been encouraged to participate in therapy to reduce her risk of deconditioning..   Diet Order             Diet regular Room service appropriate? Yes; Fluid consistency: Thin  Diet effective now                      Consultants: General surgeon  Procedures: I&D of left perirectal abscess    Medications:    Chlorhexidine Gluconate Cloth  6 each Topical Q0600   cholecalciferol  2,000 Units Oral Daily   omega-3 acid ethyl esters  1 capsule Oral Daily   pantoprazole  20 mg Oral BID   predniSONE  5 mg Oral Daily   Continuous Infusions:  cefTRIAXone (ROCEPHIN)  IV 1 g (10/24/20 1346)   lactated ringers Stopped (10/21/20 2101)   metronidazole 500 mg (10/25/20 0557)     Anti-infectives (From admission, onward)    Start     Dose/Rate Route Frequency Ordered Stop   10/22/20 1600  vancomycin (VANCOREADY) IVPB 750 mg/150 mL  Status:  Discontinued        750 mg 150 mL/hr over 60 Minutes Intravenous Every 24 hours 10/21/20 1701 10/22/20 1055   10/22/20 1400  cefTRIAXone (ROCEPHIN) 1 g in sodium chloride 0.9 % 100 mL IVPB  1 g 200 mL/hr over 30 Minutes Intravenous Every 24 hours 10/21/20 1652     10/21/20 1700  metroNIDAZOLE (FLAGYL) IVPB 500 mg        500 mg 100 mL/hr over 60 Minutes Intravenous Every 12 hours 10/21/20 1652     10/21/20 1500  vancomycin (VANCOREADY) IVPB 1500 mg/300 mL        1,500 mg 150 mL/hr over 120 Minutes Intravenous  Once 10/21/20 1442 10/21/20 2103   10/21/20 1445  cefTRIAXone (ROCEPHIN) 1 g in sodium chloride 0.9 % 100 mL IVPB        1 g 200 mL/hr over 30 Minutes Intravenous  Once  10/21/20 1442 10/21/20 1612              Family Communication/Anticipated D/C date and plan/Code Status   DVT prophylaxis: SCDs Start: 10/21/20 2036     Code Status: Full Code  Family Communication: Optician, dispensing (daughter) on speaker phone with patient   Status is: Inpatient  Remains inpatient appropriate because:IV treatments appropriate due to intensity of illness or inability to take PO  Dispo: The patient is from: Home              Anticipated d/c is to: Home              Patient currently is not medically stable to d/c.   Difficult to place patient No           Subjective:   She complained that she was being asked to do too much given that she has just had surgery.  She also believes recent chemotherapy and recent surgery have worsened her neuropathy.  She was unwilling to participate in therapy today.  Objective:    Vitals:   10/24/20 0900 10/25/20 0139 10/25/20 0751 10/25/20 1145  BP: 140/78 (!) 163/88 (!) 157/78 (!) 142/75  Pulse: 80 68 79 72  Resp: 18 16 16 16   Temp: 98.2 F (36.8 C) 98.2 F (36.8 C) 98.1 F (36.7 C) 97.8 F (36.6 C)  TempSrc: Oral Oral    SpO2: 100% 100% 100% 100%  Weight:      Height:       No data found.   Intake/Output Summary (Last 24 hours) at 10/25/2020 1337 Last data filed at 10/25/2020 1039 Gross per 24 hour  Intake 360 ml  Output 1450 ml  Net -1090 ml   Filed Weights   10/21/20 1044 10/21/20 1945  Weight: 65.8 kg 72.6 kg    Exam:  GEN: NAD SKIN: Warm and dry.  Dressing on left buttock wound is dry, clean and intact EYES: No pallor or icterus   ENT: MMM CV: RRR PULM: CTA B ABD: soft, ND, NT, +BS, + colostomy CNS: AAO x 3, non focal EXT: No edema or tenderness             Data Reviewed:   I have personally reviewed following labs and imaging studies:  Labs: Labs show the following:   Basic Metabolic Panel: Recent Labs  Lab 10/21/20 2029 10/22/20 0423 10/23/20 0606 10/24/20 0600  10/25/20 0600  NA 137 138 139 139 140  K 3.8 3.9 3.8 3.5 3.8  CL 109 110 110 111 113*  CO2 17* 18* 21* 21* 21*  GLUCOSE 130* 70 81 100* 80  BUN 17 16 23  26* 29*  CREATININE 1.50* 1.54* 1.89* 1.95* 1.85*  CALCIUM 8.1* 8.0* 8.5* 8.4* 8.4*  MG 1.4* 2.1  --   --   --  GFR Estimated Creatinine Clearance: 30.7 mL/min (A) (by C-G formula based on SCr of 1.85 mg/dL (H)). Liver Function Tests: No results for input(s): AST, ALT, ALKPHOS, BILITOT, PROT, ALBUMIN in the last 168 hours. No results for input(s): LIPASE, AMYLASE in the last 168 hours. No results for input(s): AMMONIA in the last 168 hours. Coagulation profile Recent Labs  Lab 10/21/20 1430  INR 1.2    CBC: Recent Labs  Lab 10/21/20 2029 10/22/20 0423 10/23/20 0606 10/24/20 0600 10/25/20 0600  WBC 26.6* 28.6* 29.9* 23.5* 19.3*  NEUTROABS  --   --  26.2* 21.0* 15.8*  HGB 10.0* 8.7* 9.1* 8.8* 8.8*  HCT 30.0* 26.4* 27.5* 27.1* 26.5*  MCV 94.6 95.3 94.5 92.2 93.0  PLT 147* 133* 130* 150 151   Cardiac Enzymes: No results for input(s): CKTOTAL, CKMB, CKMBINDEX, TROPONINI in the last 168 hours. BNP (last 3 results) No results for input(s): PROBNP in the last 8760 hours. CBG: Recent Labs  Lab 10/23/20 2016 10/24/20 0749 10/24/20 1238 10/25/20 0754 10/25/20 0913  GLUCAP 109* 83 114* 68* 74   D-Dimer: No results for input(s): DDIMER in the last 72 hours. Hgb A1c: No results for input(s): HGBA1C in the last 72 hours. Lipid Profile: No results for input(s): CHOL, HDL, LDLCALC, TRIG, CHOLHDL, LDLDIRECT in the last 72 hours. Thyroid function studies: No results for input(s): TSH, T4TOTAL, T3FREE, THYROIDAB in the last 72 hours.  Invalid input(s): FREET3 Anemia work up: No results for input(s): VITAMINB12, FOLATE, FERRITIN, TIBC, IRON, RETICCTPCT in the last 72 hours. Sepsis Labs: Recent Labs  Lab 10/21/20 1430 10/21/20 1635 10/21/20 2029 10/21/20 2336 10/22/20 0423 10/23/20 0606 10/24/20 0600  10/25/20 0600  PROCALCITON 3.10  --   --   --  8.58 10.74  --   --   WBC 31.5*  --  26.6*  --  28.6* 29.9* 23.5* 19.3*  LATICACIDVEN 1.2 3.7* 5.4* 1.6  --   --   --   --     Microbiology Recent Results (from the past 240 hour(s))  Blood culture (single)     Status: None (Preliminary result)   Collection Time: 10/21/20  2:43 PM   Specimen: BLOOD  Result Value Ref Range Status   Specimen Description BLOOD PORTA CATH  Final   Special Requests   Final    BOTTLES DRAWN AEROBIC AND ANAEROBIC Blood Culture adequate volume   Culture   Final    NO GROWTH 4 DAYS Performed at [facility], [address], [place], [place] [address]    Report Status PENDING  Incomplete  Resp Panel by RT-PCR (Flu A&B, Covid) Nasopharyngeal Swab     Status: None   Collection Time: 10/21/20  4:27 PM   Specimen: Nasopharyngeal Swab; Nasopharyngeal(NP) swabs in vial transport medium  Result Value Ref Range Status   SARS Coronavirus 2 by RT PCR NEGATIVE NEGATIVE Final    Comment: (NOTE) SARS-CoV-2 target nucleic acids are NOT DETECTED.  The SARS-CoV-2 RNA is generally detectable in upper respiratory specimens during the acute phase of infection. The lowest concentration of SARS-CoV-2 viral copies this assay can detect is 138 copies/mL. A negative result does not preclude SARS-Cov-2 infection and should not be used as the sole basis for treatment or other patient management decisions. A negative result may occur with  improper specimen collection/handling, submission of specimen other than nasopharyngeal swab, presence of viral mutation(s) within the areas targeted by this assay, and inadequate number of viral copies(<138 copies/mL). A negative result must be  combined with clinical observations, patient history, and epidemiological information. The expected result is Negative.  Fact Sheet for Patients:  EntrepreneurPulse.com.au  Fact Sheet for Healthcare Providers:   IncredibleEmployment.be  This test is no t yet approved or cleared by the [place] FDA and  has been authorized for detection and/or diagnosis of SARS-CoV-2 by FDA under an Emergency Use Authorization (EUA). This EUA will remain  in effect (meaning this test can be used) for the duration of the COVID-19 declaration under Section 564(b)(1) of the Act, 21 U.S.C.section 360bbb-3(b)(1), unless the authorization is terminated  or revoked sooner.       Influenza A by PCR NEGATIVE NEGATIVE Final   Influenza B by PCR NEGATIVE NEGATIVE Final    Comment: (NOTE) The Xpert Xpress SARS-CoV-2/FLU/RSV plus assay is intended as an aid in the diagnosis of influenza from Nasopharyngeal swab specimens and should not be used as a sole basis for treatment. Nasal washings and aspirates are unacceptable for Xpert Xpress SARS-CoV-2/FLU/RSV testing.  Fact Sheet for Patients: EntrepreneurPulse.com.au  Fact Sheet for Healthcare Providers: IncredibleEmployment.be  This test is not yet approved or cleared by the [place] FDA and has been authorized for detection and/or diagnosis of SARS-CoV-2 by FDA under an Emergency Use Authorization (EUA). This EUA will remain in effect (meaning this test can be used) for the duration of the COVID-19 declaration under Section 564(b)(1) of the Act, 21 U.S.C. section 360bbb-3(b)(1), unless the authorization is terminated or revoked.  Performed at [facility], [address], [place], [place] [address]   Aerobic/Anaerobic Culture w Gram Stain (surgical/deep wound)     Status: None (Preliminary result)   Collection Time: 10/21/20  6:40 PM   Specimen: PATH Other; Tissue  Result Value Ref Range Status   Specimen Description   Final    ABSCESS Performed at [facility], [address], [place], [place] [address]    Special Requests PERIRECTAL  Final   Gram Stain   Final    NO  SQUAMOUS EPITHELIAL CELLS SEEN FEW WBC SEEN FEW GRAM NEGATIVE RODS MODERATE GRAM POSITIVE COCCI    Culture   Final    CULTURE REINCUBATED FOR BETTER GROWTH ABUNDANT BACTEROIDES FRAGILIS BETA LACTAMASE POSITIVE Performed at [facility], [place] [address], [place], [place] [address]    Report Status PENDING  Incomplete  MRSA Next Gen by PCR, Nasal     Status: None   Collection Time: 10/21/20  7:46 PM   Specimen: Nasal Mucosa; Nasal Swab  Result Value Ref Range Status   MRSA by PCR Next Gen NOT DETECTED NOT DETECTED Final    Comment: (NOTE) The GeneXpert MRSA Assay (FDA approved for NASAL specimens only), is one component of a comprehensive MRSA colonization surveillance program. It is not intended to diagnose MRSA infection nor to guide or monitor treatment for MRSA infections. Test performance is not FDA approved in patients less than 63 years old. Performed at [facility], [address] [place],  [address]     Procedures and diagnostic studies:  No results found.             LOS: 4 days   Jamaiya Tunnell  [facility]   Pager on [email or web address]. If 7PM-7AM, please contact night-coverage at [email or web address]     10/25/2020, 1:37 PM

## 2020-10-25 NOTE — Evaluation (Signed)
Occupational Therapy Evaluation Patient Details Name: Arraya Buck MRN: [id number] DOB: 10-Sep-1960 Today's Date: 10/25/2020   History of Present Illness 60 y.o with significant PMH of stage III colon cancer s/p total colectomy and ileostomy in February, SBO secondary to postsurgical adhesion, perforated duodenal ulcer and chronic stage IV kidney disease who presented to the ED with chief complaints of left gluteal mass associated with the discomfort and burning sensation. S/P I &D 10/21/20   Clinical Impression   Ms. Chumley presents today with generalized weakness, limited endurance, impaired balance, reduced sensation in all four extremities, and 8/10 pain in L buttocks. Prior to admission, she had been living with her daughter, who works from home and assists pt as needed. Pt reports neuropathy in b/l UE and LE beginning ~ 2 weeks ago, s/p resumption of chemotherapy. Provided education to pt and daughter re: importance of OOB mobility, repositioning in bed for comfort and to avoid skin breakdown. Pt displays high degree of anxiety today, exhibits guarding behaviors with bed mobility and transfers, as she is anxious that movement will cause pain. Discussed benefits of DC to SNF vs home with assistance, with family preferring home with [place], [place], and nursing for wound care. Assuming pt and family can manage pain and wound care, DC to home with supports should be adequate. Recommend ongoing OT during hospitalization. Pt will benefit from rolling walker, hospital bed, and BSC at home.      Recommendations for follow up therapy are one component of a multi-disciplinary discharge planning process, led by the attending physician.  Recommendations may be updated based on patient status, additional functional criteria and insurance authorization.   Follow Up Recommendations  Home health OT    Equipment Recommendations  3 in 1 bedside commode;Hospital bed;Other (comment) (RW)    Recommendations for  Other Services       Precautions / Restrictions Precautions Precautions: Fall Restrictions Weight Bearing Restrictions: No      Mobility Bed Mobility Overal bed mobility: Needs Assistance Bed Mobility: Supine to Sit;Sit to Supine;Rolling Rolling: Mod assist   Supine to sit: Mod assist Sit to supine: Min assist   General bed mobility comments: greatly increased time, effort, and encouragment to engage in bed mobility    Transfers Overall transfer level: Needs assistance Equipment used: Rolling walker (2 wheeled)   Sit to Stand: Min assist;From elevated surface         General transfer comment: pt displays anxiety re: OOB movement, fearful of pain    Balance Overall balance assessment: Needs assistance Sitting-balance support: Feet supported;Bilateral upper extremity supported Sitting balance-Leahy Scale: Fair   Postural control: Right lateral lean Standing balance support: Bilateral upper extremity supported;During functional activity Standing balance-Leahy Scale: Fair Standing balance comment: pt reports difficult to stand 2/2 reduced sensation in feet                           ADL either performed or assessed with clinical judgement   ADL Overall ADL's : Needs assistance/impaired Eating/Feeding: Independent                   Lower Body Dressing: Supervision/safety Lower Body Dressing Details (indicate cue type and reason): increased time/effort for donning/doffing socks             Functional mobility during ADLs: Moderate assistance       Vision         Perception     Praxis  Pertinent Vitals/Pain Pain Score: 8  Pain Location: wound pain Pain Descriptors / Indicators: Discomfort;Grimacing;Guarding;Moaning Pain Intervention(s): Limited activity within patient's tolerance;Repositioned     Hand Dominance     Extremity/Trunk Assessment Upper Extremity Assessment Upper Extremity Assessment: RUE deficits/detail;LUE  deficits/detail RUE Deficits / Details: pt reports b/l UE neuropathy, reduced sensation LUE Deficits / Details: pt reports b/l UE neuropathy, reduced sensation   Lower Extremity Assessment Lower Extremity Assessment: LLE deficits/detail;RLE deficits/detail RLE Deficits / Details: pt reports b/l LE neuropathy, pain LLE Deficits / Details: pt reports b/l LE neuropathy, pain       Communication Communication Communication: No difficulties   Cognition Arousal/Alertness: Awake/alert Behavior During Therapy: Anxious;WFL for tasks assessed/performed Overall Cognitive Status: Within Functional Limits for tasks assessed                                     General Comments       Exercises Other Exercises Other Exercises: Educ re: importance of OOB mobility, awareness of skin breakdown; DC recs, DME recs   Shoulder Instructions      Home Living Family/patient expects to be discharged to:: Private residence Living Arrangements: Children Available Help at Discharge: Family;Available 24 hours/day Type of Home: House Home Access: Level entry     Home Layout: One level     Bathroom Shower/Tub: Teacher, early years/pre: Standard     Home Equipment: Shower seat;Toilet riser          Prior Functioning/Environment Level of Independence: Independent        Comments: Patient reports she was independent and active prior to this.        OT Problem List: Decreased strength;Impaired balance (sitting and/or standing);Pain;Decreased range of motion;Decreased activity tolerance;Decreased coordination;Decreased knowledge of use of DME or AE;Impaired sensation      OT Treatment/Interventions: Self-care/ADL training;DME and/or AE instruction;Therapeutic activities;Balance training;Therapeutic exercise;Energy conservation;Patient/family education    OT Goals(Current goals can be found in the care plan section) Acute Rehab OT Goals Patient Stated Goal: to  decrease pain OT Goal Formulation: With patient Time For Goal Achievement: 11/08/20 Potential to Achieve Goals: Good ADL Goals Pt Will Transfer to Toilet: with modified independence;stand pivot transfer (using LRAD) Pt Will Perform Toileting - Clothing Manipulation and hygiene: with modified independence;sit to/from stand (including ostomy care) Pt/caregiver will Perform Home Exercise Program: Increased strength;Increased ROM (to increase strength, endurance, energy)  OT Frequency: Min 1X/week   Barriers to D/C:            Co-evaluation              AM-PAC OT "6 Clicks" Daily Activity     Outcome Measure Help from another person eating meals?: None Help from another person taking care of personal grooming?: A Little Help from another person toileting, which includes using toliet, bedpan, or urinal?: A Lot Help from another person bathing (including washing, rinsing, drying)?: A Lot Help from another person to put on and taking off regular upper body clothing?: A Little Help from another person to put on and taking off regular lower body clothing?: A Lot 6 Click Score: 16   End of Session Equipment Utilized During Treatment: Rolling walker  Activity Tolerance: Patient tolerated treatment well;Patient limited by pain Patient left: in bed;with call bell/phone within reach;with chair alarm set  OT Visit Diagnosis: Unsteadiness on feet (R26.81);Muscle weakness (generalized) (M62.81);Other abnormalities of gait and mobility (R26.89);Pain  Time: 1005-1047 OT Time Calculation (min): 42 min Charges:  OT General Charges $OT Visit: 1 Visit OT Evaluation $OT Eval Moderate Complexity: 1 Mod OT Treatments $Self Care/Home Management : 38-52 mins Josiah Lobo, PhD, MS, OTR/L 10/25/20, 1:32 PM

## 2020-10-26 DIAGNOSIS — L0231 Cutaneous abscess of buttock: Secondary | ICD-10-CM | POA: Diagnosis not present

## 2020-10-26 LAB — CULTURE, BLOOD (SINGLE)
Culture: NO GROWTH
Special Requests: ADEQUATE

## 2020-10-26 LAB — CBC WITH DIFFERENTIAL/PLATELET
Abs Immature Granulocytes: 1.32 10*3/uL — ABNORMAL HIGH (ref 0.00–0.07)
Basophils Absolute: 0.1 10*3/uL (ref 0.0–0.1)
Basophils Relative: 1 %
Eosinophils Absolute: 0.1 10*3/uL (ref 0.0–0.5)
Eosinophils Relative: 1 %
HCT: 27.8 % — ABNORMAL LOW (ref 36.0–46.0)
Hemoglobin: 9.1 g/dL — ABNORMAL LOW (ref 12.0–15.0)
Immature Granulocytes: 8 %
Lymphocytes Relative: 9 %
Lymphs Abs: 1.5 10*3/uL (ref 0.7–4.0)
MCH: 29.9 pg (ref 26.0–34.0)
MCHC: 32.7 g/dL (ref 30.0–36.0)
MCV: 91.4 fL (ref 80.0–100.0)
Monocytes Absolute: 1.8 10*3/uL — ABNORMAL HIGH (ref 0.1–1.0)
Monocytes Relative: 11 %
Neutro Abs: 12.2 10*3/uL — ABNORMAL HIGH (ref 1.7–7.7)
Neutrophils Relative %: 70 %
Platelets: 176 10*3/uL (ref 150–400)
RBC: 3.04 MIL/uL — ABNORMAL LOW (ref 3.87–5.11)
RDW: 17.6 % — ABNORMAL HIGH (ref 11.5–15.5)
Smear Review: NORMAL
WBC: 17.1 10*3/uL — ABNORMAL HIGH (ref 4.0–10.5)
nRBC: 0.4 % — ABNORMAL HIGH (ref 0.0–0.2)

## 2020-10-26 LAB — GLUCOSE, CAPILLARY
Glucose-Capillary: 65 mg/dL — ABNORMAL LOW (ref 70–99)
Glucose-Capillary: 88 mg/dL (ref 70–99)

## 2020-10-26 MED ORDER — SODIUM CHLORIDE 0.9 % IV SOLN
1.0000 g | INTRAVENOUS | Status: DC
  Administered 2020-10-27 – 2020-10-29 (×3): 1 g via INTRAVENOUS
  Filled 2020-10-26 (×4): qty 10

## 2020-10-26 MED ORDER — METRONIDAZOLE 500 MG/100ML IV SOLN
500.0000 mg | Freq: Two times a day (BID) | INTRAVENOUS | Status: DC
  Administered 2020-10-26 – 2020-10-29 (×6): 500 mg via INTRAVENOUS
  Filled 2020-10-26 (×8): qty 100

## 2020-10-26 MED ORDER — ENSURE ENLIVE PO LIQD
237.0000 mL | Freq: Two times a day (BID) | ORAL | Status: DC
  Administered 2020-10-26: 237 mL via ORAL

## 2020-10-26 NOTE — Progress Notes (Signed)
Hypoglycemic Event  CBG: 65 at  08:41am  Treatment: orange juice 120 ml  Symptoms: none  Follow-up CBG: [id number] CBG Result:88  Possible Reasons for Event: unknown  Comments/MD notified:to advise Dr Sharlyn Bologna

## 2020-10-26 NOTE — Progress Notes (Signed)
Progress Note    Tanya Osborne  ENI:[id number] DOB: 04-Apr-1960  DOA: 10/21/2020 PCP: Annice Needy, MD      Brief Narrative:    Medical records reviewed and are as summarized below:  Tanya Osborne is a 60 y.o. female with medical history significant of rheumatoid arthritis on prednisone chronically, hypertension, hyperlipidemia, GERD, primary sclerosing cholangitis, CKD-IV, anemia, perforated duodenal ulcer, autoimmune liver disease, small bowel obstruction, colon cancer (s/p colectomy and end ileostomy at [facility] in February of 2020, on chemotherapy and recent dose was 2 weeks prior to admission).  She presented to the hospital because of pain in the left buttock.      Assessment/Plan:   Principal Problem:   Abscess, gluteal, left Active Problems:   RA (rheumatoid arthritis) (HCC)   Essential hypertension   History of colon cancer   Iron deficiency anemia   GERD (gastroesophageal reflux disease)   CKD (chronic kidney disease), stage IIIb   Sepsis (HCC)   Malnutrition of moderate degree   Nutrition Problem: Moderate Malnutrition (in the context of chronic illness) Etiology: cancer and cancer related treatments  Signs/Symptoms:  (mild/moderate muscle and fat depletions and severe weight loss of 14.2% x 6 months)   Body mass index is 25.07 kg/m.  Severe sepsis secondary to left perirectal/gluteal abscess, severe leukocytosis, immunocompromised state: WBC is trending down.  Wound culture showed moderate actinomyces species and abundant Bacteroides fragilis. Case was discussed with Dr. West Bali, [place] specialist on-call.  She recommended continuing IV ceftriaxone and Flagyl because of penicillin allergy.  Formal consult to be obtained from ID physician tomorrow.  Recurrent hypoglycemia: Asymptomatic.  Glucose was 68 and 65 this morning.  Dizziness likely from poor oral intake.  Encourage adequate oral intake.  Ensure supplement has been added   CKD stage IV:  Creatinine is stable.  Rheumatoid arthritis, autoimmune liver disease, primary sclerosing cholangitis: Continue low-dose prednisone  Probable peripheral neuropathy: Analgesics as needed  Stage III colon cancer (February 2020) s/p 12 cycles of FOLFOX chemotherapy.  She received chemotherapy about 2 weeks prior to admission. 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    Diet Order             Diet regular Room service appropriate? Yes; Fluid consistency: Thin  Diet effective now                      Consultants: General surgeon  Procedures: I&D of left perirectal abscess    Medications:    Chlorhexidine Gluconate Cloth  6 each Topical Q0600   cholecalciferol  2,000 Units Oral Daily   omega-3 acid ethyl esters  1 capsule Oral Daily   pantoprazole  20 mg Oral BID   predniSONE  5 mg Oral Daily   Continuous Infusions:  cefTRIAXone (ROCEPHIN)  IV 1 g (10/26/20 1417)   lactated ringers Stopped (10/21/20 2101)   metronidazole 500 mg (10/26/20 0559)     Anti-infectives (From admission, onward)    Start     Dose/Rate Route Frequency Ordered Stop   10/22/20 1600  vancomycin (VANCOREADY) IVPB 750 mg/150 mL  Status:  Discontinued        750 mg 150 mL/hr over 60 Minutes Intravenous Every 24 hours 10/21/20 1701 10/22/20 1055   10/22/20 1400  cefTRIAXone (ROCEPHIN) 1 g in sodium chloride 0.9 % 100 mL IVPB        1 g 200 mL/hr over 30 Minutes Intravenous Every  24 hours 10/21/20 1652     10/21/20 1700  metroNIDAZOLE (FLAGYL) IVPB 500 mg        500 mg 100 mL/hr over 60 Minutes Intravenous Every 12 hours 10/21/20 1652     10/21/20 1500  vancomycin (VANCOREADY) IVPB 1500 mg/300 mL        1,500 mg 150 mL/hr over 120 Minutes Intravenous  Once 10/21/20 1442 10/21/20 2103   10/21/20 1445  cefTRIAXone (ROCEPHIN) 1 g in sodium chloride 0.9 % 100 mL IVPB        1 g 200 mL/hr over 30 Minutes Intravenous  Once 10/21/20  1442 10/21/20 1612              Family Communication/Anticipated D/C date and plan/Code Status   DVT prophylaxis: SCDs Start: 10/21/20 2036     Code Status: Full Code  Family Communication: None   Status is: Inpatient  Remains inpatient appropriate because:IV treatments appropriate due to intensity of illness or inability to take PO  Dispo: The patient is from: Home              Anticipated d/c is to: Home              Patient currently is not medically stable to d/c.   Difficult to place patient No           Subjective:   Interval events noted.  She has been having low glucose levels although she is asymptomatic from this.  Chart review shows that she is not eating much.  Objective:    Vitals:   10/25/20 2014 10/26/20 0539 10/26/20 0841 10/26/20 1158  BP: (!) 166/73 (!) 153/74 (!) 142/81 (!) 143/81  Pulse: 66 70 70 72  Resp: 16 14 18 18   Temp: 98.5 F (36.9 C) 98 F (36.7 C) 97.7 F (36.5 C) 97.7 F (36.5 C)  TempSrc:  Oral Oral Oral  SpO2: 100% 100% 100% 100%  Weight:      Height:       No data found.   Intake/Output Summary (Last 24 hours) at 10/26/2020 1458 Last data filed at 10/26/2020 1440 Gross per 24 hour  Intake 220 ml  Output 1450 ml  Net -1230 ml   Filed Weights   10/21/20 1044 10/21/20 1945  Weight: 65.8 kg 72.6 kg    Exam:  GEN: NAD SKIN: Dressing on left buttock wound is dry, clean and intact EYES: No pallor or icterus ENT: MMM CV: RRR PULM: CTA B ABD: soft, ND, NT, +BS, + colostomy CNS: AAO x 3, non focal EXT: No edema or tenderness               Data Reviewed:   I have personally reviewed following labs and imaging studies:  Labs: Labs show the following:   Basic Metabolic Panel: Recent Labs  Lab 10/21/20 2029 10/22/20 0423 10/23/20 0606 10/24/20 0600 10/25/20 0600  NA 137 138 139 139 140  K 3.8 3.9 3.8 3.5 3.8  CL 109 110 110 111 113*  CO2 17* 18* 21* 21* 21*  GLUCOSE 130* 70 81  100* 80  BUN 17 16 23  26* 29*  CREATININE 1.50* 1.54* 1.89* 1.95* 1.85*  CALCIUM 8.1* 8.0* 8.5* 8.4* 8.4*  MG 1.4* 2.1  --   --   --    GFR Estimated Creatinine Clearance: 30.7 mL/min (A) (by C-G formula based on SCr of 1.85 mg/dL (H)). Liver Function Tests: No results for input(s): AST, ALT, ALKPHOS, BILITOT, PROT, ALBUMIN in  the last 168 hours. No results for input(s): LIPASE, AMYLASE in the last 168 hours. No results for input(s): AMMONIA in the last 168 hours. Coagulation profile Recent Labs  Lab 10/21/20 1430  INR 1.2    CBC: Recent Labs  Lab 10/22/20 0423 10/23/20 0606 10/24/20 0600 10/25/20 0600 10/26/20 0500  WBC 28.6* 29.9* 23.5* 19.3* 17.1*  NEUTROABS  --  26.2* 21.0* 15.8* 12.2*  HGB 8.7* 9.1* 8.8* 8.8* 9.1*  HCT 26.4* 27.5* 27.1* 26.5* 27.8*  MCV 95.3 94.5 92.2 93.0 91.4  PLT 133* 130* 150 151 176   Cardiac Enzymes: No results for input(s): CKTOTAL, CKMB, CKMBINDEX, TROPONINI in the last 168 hours. BNP (last 3 results) No results for input(s): PROBNP in the last 8760 hours. CBG: Recent Labs  Lab 10/24/20 1238 10/25/20 0754 10/25/20 0913 10/26/20 0841 10/26/20 0955  GLUCAP 114* 68* 74 65* 88   D-Dimer: No results for input(s): DDIMER in the last 72 hours. Hgb A1c: No results for input(s): HGBA1C in the last 72 hours. Lipid Profile: No results for input(s): CHOL, HDL, LDLCALC, TRIG, CHOLHDL, LDLDIRECT in the last 72 hours. Thyroid function studies: No results for input(s): TSH, T4TOTAL, T3FREE, THYROIDAB in the last 72 hours.  Invalid input(s): FREET3 Anemia work up: No results for input(s): VITAMINB12, FOLATE, FERRITIN, TIBC, IRON, RETICCTPCT in the last 72 hours. Sepsis Labs: Recent Labs  Lab 10/21/20 1430 10/21/20 1635 10/21/20 2029 10/21/20 2336 10/22/20 0423 10/23/20 0606 10/24/20 0600 10/25/20 0600 10/26/20 0500  PROCALCITON 3.10  --   --   --  8.58 10.74  --   --   --   WBC 31.5*  --  26.6*  --  28.6* 29.9* 23.5* 19.3* 17.1*   LATICACIDVEN 1.2 3.7* 5.4* 1.6  --   --   --   --   --     Microbiology Recent Results (from the past 240 hour(s))  Blood culture (single)     Status: None   Collection Time: 10/21/20  2:43 PM   Specimen: BLOOD  Result Value Ref Range Status   Specimen Description BLOOD PORTA CATH  Final   Special Requests   Final    BOTTLES DRAWN AEROBIC AND ANAEROBIC Blood Culture adequate volume   Culture   Final    NO GROWTH 5 DAYS Performed at [facility], [address] [place],  [address]    Report Status 10/26/2020 FINAL  Final  Resp Panel by RT-PCR (Flu A&B, Covid) Nasopharyngeal Swab     Status: None   Collection Time: 10/21/20  4:27 PM   Specimen: Nasopharyngeal Swab; Nasopharyngeal(NP) swabs in vial transport medium  Result Value Ref Range Status   SARS Coronavirus 2 by RT PCR NEGATIVE NEGATIVE Final    Comment: (NOTE) SARS-CoV-2 target nucleic acids are NOT DETECTED.  The SARS-CoV-2 RNA is generally detectable in upper respiratory specimens during the acute phase of infection. The lowest concentration of SARS-CoV-2 viral copies this assay can detect is 138 copies/mL. A negative result does not preclude SARS-Cov-2 infection and should not be used as the sole basis for treatment or other patient management decisions. A negative result may occur with  improper specimen collection/handling, submission of specimen other than nasopharyngeal swab, presence of viral mutation(s) within the areas targeted by this assay, and inadequate number of viral copies(<138 copies/mL). A negative result must be combined with clinical observations, patient history, and epidemiological information. The expected result is Negative.  Fact Sheet for Patients:  EntrepreneurPulse.com.au  Fact Sheet for  Healthcare Providers:  IncredibleEmployment.be  This test is no t yet approved or cleared by the [place] and  has been authorized for  detection and/or diagnosis of SARS-CoV-2 by FDA under an Emergency Use Authorization (EUA). This EUA will remain  in effect (meaning this test can be used) for the duration of the COVID-19 declaration under Section 564(b)(1) of the Act, 21 U.S.C.section 360bbb-3(b)(1), unless the authorization is terminated  or revoked sooner.       Influenza A by PCR NEGATIVE NEGATIVE Final   Influenza B by PCR NEGATIVE NEGATIVE Final    Comment: (NOTE) The Xpert Xpress SARS-CoV-2/FLU/RSV plus assay is intended as an aid in the diagnosis of influenza from Nasopharyngeal swab specimens and should not be used as a sole basis for treatment. Nasal washings and aspirates are unacceptable for Xpert Xpress SARS-CoV-2/FLU/RSV testing.  Fact Sheet for Patients: EntrepreneurPulse.com.au  Fact Sheet for Healthcare Providers: IncredibleEmployment.be  This test is not yet approved or cleared by the [place] FDA and has been authorized for detection and/or diagnosis of SARS-CoV-2 by FDA under an Emergency Use Authorization (EUA). This EUA will remain in effect (meaning this test can be used) for the duration of the COVID-19 declaration under Section 564(b)(1) of the Act, 21 U.S.C. section 360bbb-3(b)(1), unless the authorization is terminated or revoked.  Performed at [facility], [address], [place], [place] [address]   Aerobic/Anaerobic Culture w Gram Stain (surgical/deep wound)     Status: None   Collection Time: 10/21/20  6:40 PM   Specimen: PATH Other; Tissue  Result Value Ref Range Status   Specimen Description   Final    ABSCESS Performed at [facility], [address] [place], [place] [address]    Special Requests PERIRECTAL  Final   Gram Stain   Final    NO SQUAMOUS EPITHELIAL CELLS SEEN FEW WBC SEEN FEW GRAM NEGATIVE RODS MODERATE GRAM POSITIVE COCCI    Culture   Final    MODERATE ACTINOMYCES SPECIES Standardized  susceptibility testing for this organism is not available. ABUNDANT BACTEROIDES FRAGILIS BETA LACTAMASE POSITIVE Performed at [facility], [place] [address] [place], [place] [address]    Report Status 10/25/2020 FINAL  Final  MRSA Next Gen by PCR, Nasal     Status: None   Collection Time: 10/21/20  7:46 PM   Specimen: Nasal Mucosa; Nasal Swab  Result Value Ref Range Status   MRSA by PCR Next Gen NOT DETECTED NOT DETECTED Final    Comment: (NOTE) The GeneXpert MRSA Assay (FDA approved for NASAL specimens only), is one component of a comprehensive MRSA colonization surveillance program. It is not intended to diagnose MRSA infection nor to guide or monitor treatment for MRSA infections. Test performance is not FDA approved in patients less than 72 years old. Performed at [facility], [address] [place], [place] [address]     Procedures and diagnostic studies:  No results found.             LOS: 5 days   Tanya Osborne  [facility]   Pager on [email or web address]. If 7PM-7AM, please contact night-coverage at [email or web address]     10/26/2020, 2:58 PM

## 2020-10-26 NOTE — Progress Notes (Signed)
[place] [facility] Day(s): 5.   Post op day(s): 5 Days Post-Op.   Interval History:  Patient seen and examined No acute events or new complaints overnight.  Patient reports dressing changes not been completed yet today, she has become passively uncooperative. No fever, chills Her leukocytosis has made improvements again today.   Renal function; UO -1200 ccs Cx from OR growing GNR and GPC She continues on Rocephin and Flagyl, currently assessing possibilities of transitioning to p.o. antibiotics.  Vital signs in last 24 hours: [min-max] current  Temp:  [97.7 F (36.5 C)-98.5 F (36.9 C)] 97.7 F (36.5 C) (10/16 1158) Pulse Rate:  [66-72] 72 (10/16 1158) Resp:  [14-18] 18 (10/16 1158) BP: (142-166)/(73-81) 143/81 (10/16 1158) SpO2:  [100 %] 100 % (10/16 1158)     Height: 5\' 7"  (170.2 cm) Weight: 72.6 kg BMI (Calculated): 25.06   Intake/Output last 2 shifts:  10/15 0701 - 10/16 0700 In: 460 [P.O.:360; IV Piggyback:100] Out: 1550 [Urine:1200; Stool:350]   Physical Exam:  Constitutional: alert, and no distress  Respiratory: breathing non-labored at rest  Cardiovascular: Regular rate and rhythm  Integumentary: Left perirectal I&D; no significant erythema, no further evidence of necrosis or abscess  Labs:  CBC Latest Ref Rng & Units 10/26/2020 10/25/2020 10/24/2020  WBC 4.0 - 10.5 K/uL 17.1(H) 19.3(H) 23.5(H)  Hemoglobin 12.0 - 15.0 g/dL 9.1(L) 8.8(L) 8.8(L)  Hematocrit 36.0 - 46.0 % 27.8(L) 26.5(L) 27.1(L)  Platelets 150 - 400 K/uL 176 151 150   CMP Latest Ref Rng & Units 10/25/2020 10/24/2020 10/23/2020  Glucose 70 - 99 mg/dL 80 100(H) 81  BUN 8 - 23 mg/dL 29(H) 26(H) 23  Creatinine 0.44 - 1.00 mg/dL 1.85(H) 1.95(H) 1.89(H)  Sodium 135 - 145 mmol/L 140 139 139  Potassium 3.5 - 5.1 mmol/L 3.8 3.5 3.8  Chloride 98 - 111 mmol/L 113(H) 111 110  CO2 22 - 32 mmol/L 21(L) 21(L) 21(L)  Calcium 8.9 - 10.3 mg/dL 8.4(L) 8.4(L) 8.5(L)   Total Protein 6.5 - 8.1 g/dL - - -  Total Bilirubin 0.3 - 1.2 mg/dL - - -  Alkaline Phos 38 - 126 U/L - - -  AST 15 - 41 U/L - - -  ALT 0 - 44 U/L - - -   Abscess culture: MODERATE ACTINOMYCES SPECIES  Standardized susceptibility testing for this organism is not available.  ABUNDANT BACTEROIDES FRAGILIS  BETA LACTAMASE POSITIVE    Assessment/Plan:  60 y.o. female 5 Days Post-Op s/p incision and drainage for left perirectal abscess, complicated by pertinent comorbidities including colon CA slp colectomy, ileostomy, and adjuvant chemotherapy.   - Okay for diet from surgical perspective  - Wound Care; Pack wound daily with vaginal packing -vs- Kerlix gauze, cover and secure.  Nursing is to continue wound care on a daily basis. - IV Abx (Rocephin, Flagyl); Cx growing actinomyces, and Bacteroides fragilis.             - Pain control prn              - Worked with therapy; recommending SNF             - Will order home health for wound care if patient elects for home             - Further management per primary team; we will follow     -- Ronny Bacon, M.D., [facility]  10/26/2020 ; 4:20 PM

## 2020-10-27 LAB — CBC WITH DIFFERENTIAL/PLATELET
Abs Immature Granulocytes: 2.39 10*3/uL — ABNORMAL HIGH (ref 0.00–0.07)
Basophils Absolute: 0.2 10*3/uL — ABNORMAL HIGH (ref 0.0–0.1)
Basophils Relative: 1 %
Eosinophils Absolute: 0.1 10*3/uL (ref 0.0–0.5)
Eosinophils Relative: 0 %
HCT: 28.6 % — ABNORMAL LOW (ref 36.0–46.0)
Hemoglobin: 9.5 g/dL — ABNORMAL LOW (ref 12.0–15.0)
Immature Granulocytes: 12 %
Lymphocytes Relative: 12 %
Lymphs Abs: 2.3 10*3/uL (ref 0.7–4.0)
MCH: 30.9 pg (ref 26.0–34.0)
MCHC: 33.2 g/dL (ref 30.0–36.0)
MCV: 93.2 fL (ref 80.0–100.0)
Monocytes Absolute: 2.3 10*3/uL — ABNORMAL HIGH (ref 0.1–1.0)
Monocytes Relative: 12 %
Neutro Abs: 12.4 10*3/uL — ABNORMAL HIGH (ref 1.7–7.7)
Neutrophils Relative %: 63 %
Platelets: 207 10*3/uL (ref 150–400)
RBC: 3.07 MIL/uL — ABNORMAL LOW (ref 3.87–5.11)
RDW: 18 % — ABNORMAL HIGH (ref 11.5–15.5)
Smear Review: NORMAL
WBC: 19.6 10*3/uL — ABNORMAL HIGH (ref 4.0–10.5)
nRBC: 0.4 % — ABNORMAL HIGH (ref 0.0–0.2)

## 2020-10-27 LAB — GLUCOSE, CAPILLARY
Glucose-Capillary: 66 mg/dL — ABNORMAL LOW (ref 70–99)
Glucose-Capillary: 98 mg/dL (ref 70–99)

## 2020-10-27 NOTE — Progress Notes (Signed)
Physical Therapy Treatment Patient Details Name: Tanya Osborne MRN: [id number] DOB: 1960-06-30 Today's Date: 10/27/2020   History of Present Illness 60 y.o with significant PMH of stage III colon cancer s/p total colectomy and ileostomy in February, SBO secondary to postsurgical adhesion, perforated duodenal ulcer and chronic stage IV kidney disease who presented to the ED with chief complaints of left gluteal mass associated with the discomfort and burning sensation. S/P I &D 10/21/20    PT Comments    Pt was pleasant and motivated to participate during the session and put forth good effort throughout.  Pt required physical assistance and cuing for proper sequencing with all bed mobility tasks and transfers but once in standing was able to amb a max of around 5 feet without assist.  Pt's steps were small and effortful, close to shuffling with poor clearance, but demonstrated improvement from prior sessions.  Pt's SpO2 and HR were both WNL throughout the session. Pt will benefit from PT services in a SNF setting upon discharge to safely address deficits listed in patient problem list for decreased caregiver assistance and eventual return to PLOF.     Recommendations for follow up therapy are one component of a multi-disciplinary discharge planning process, led by the attending physician.  Recommendations may be updated based on patient status, additional functional criteria and insurance authorization.  Follow Up Recommendations  SNF;Supervision for mobility/OOB     Equipment Recommendations  Other (comment) (TBD at next venue of care)    Recommendations for Other Services       Precautions / Restrictions Precautions Precautions: Fall Restrictions Weight Bearing Restrictions: No Other Position/Activity Restrictions: Colostomy bag RLQ and port RUQ     Mobility  Bed Mobility Overal bed mobility: Needs Assistance Bed Mobility: Rolling;Sidelying to Sit;Sit to Sidelying Rolling:  Min assist Sidelying to sit: Mod assist;Min assist     Sit to sidelying: Mod assist;Min assist General bed mobility comments: Min to mod A during log roll training for BLE and trunk control    Transfers Overall transfer level: Needs assistance Equipment used: Rolling walker (2 wheeled) Transfers: Sit to/from Stand Sit to Stand: Min assist;From elevated surface         General transfer comment: Mod verbal cues for foot positioning and increased trunk flex  Ambulation/Gait Ambulation/Gait assistance: Min guard Gait Distance (Feet): 5 Feet Assistive device: Rolling walker (2 wheeled) Gait Pattern/deviations: Step-to pattern;Decreased step length - right;Decreased step length - left Gait velocity: decreased   General Gait Details: Pt able to side step left/right at the EOB 2 x 5 feet with very slow, effortful cadence and poor foot clearance but was steady without buckling or LOB   Stairs             Wheelchair Mobility    Modified Rankin (Stroke Patients Only)       Balance Overall balance assessment: Needs assistance Sitting-balance support: Feet supported;Bilateral upper extremity supported Sitting balance-Leahy Scale: Good     Standing balance support: Bilateral upper extremity supported;During functional activity Standing balance-Leahy Scale: Fair Standing balance comment: Min to mod lean on the RW for support                            Cognition Arousal/Alertness: Awake/alert Behavior During Therapy: WFL for tasks assessed/performed Overall Cognitive Status: Within Functional Limits for tasks assessed  Exercises Total Joint Exercises Ankle Circles/Pumps: AROM;Strengthening;Both;5 reps;10 reps Quad Sets: Strengthening;Both;10 reps;5 reps Gluteal Sets: Strengthening;Both;5 reps Heel Slides: AAROM;Strengthening;AROM;Both;10 reps Hip ABduction/ADduction: Strengthening;Both;10  reps Straight Leg Raises: AROM;AAROM;Strengthening;Both;10 reps Long Arc Quad: AROM;Strengthening;Both;5 reps;10 reps Knee Flexion: AROM;Strengthening;Both;10 reps;5 reps Marching in Standing: AROM;Strengthening;Both;5 reps;Standing Other Exercises Other Exercises: HEP education for BLE APs, QS, and LAQs x 10 each 3-4x/day Other Exercises: Log roll training    General Comments        Pertinent Vitals/Pain Pain Assessment: 0-10 Pain Score: 7  Pain Location: Surgical area on bottom and BLEs Pain Descriptors / Indicators: Sore;Aching Pain Intervention(s): Premedicated before session;Repositioned;Monitored during session    Home Living                      Prior Function            PT Goals (current goals can now be found in the care plan section) Progress towards PT goals: Progressing toward goals    Frequency    Min 2X/week      PT Plan Current plan remains appropriate    Co-evaluation              AM-PAC PT "6 Clicks" Mobility   Outcome Measure  Help needed turning from your back to your side while in a flat bed without using bedrails?: A Little Help needed moving from lying on your back to sitting on the side of a flat bed without using bedrails?: A Lot Help needed moving to and from a bed to a chair (including a wheelchair)?: A Lot Help needed standing up from a chair using your arms (e.g., wheelchair or bedside chair)?: A Lot Help needed to walk in hospital room?: Total Help needed climbing 3-5 steps with a railing? : Total 6 Click Score: 11    End of Session   Activity Tolerance: Patient tolerated treatment well Patient left: in bed;with call bell/phone within reach;with bed alarm set;with nursing/sitter in room Nurse Communication: Mobility status PT Visit Diagnosis: Other abnormalities of gait and mobility (R26.89);Pain     Time: 4210-3128 PT Time Calculation (min) (ACUTE ONLY): 38 min  Charges:  $Gait Training: 8-22  mins $Therapeutic Exercise: 8-22 mins $Therapeutic Activity: 8-22 mins                     D. Scott Bary Limbach PT, DPT 10/27/20, 9:56 AM

## 2020-10-27 NOTE — Consult Note (Addendum)
Infectious Disease     Reason for Consult: Actinomyces wound  infection    Referring Physician: Dr Jovita Kussmaul Date of Admission:  10/21/2020   Principal Problem:   Abscess, gluteal, left Active Problems:   RA (rheumatoid arthritis) ([place])   Essential hypertension   History of colon cancer   Iron deficiency anemia   GERD (gastroesophageal reflux disease)   CKD (chronic kidney disease), stage IIIb   Sepsis (HCC)   Malnutrition of moderate degree   HPI: Tanya Osborne is a 60 y.o. female with a history of multiple medical problems including rheumatoid arthritis on prednisone chronically, primary sclerosing cholangitis, autoimmune liver disease, stage III colon cancer status post colectomy and ileostomy, currently on chemotherapy history of small bowel obstruction, history of perforated duodenal ulcer, chronic kidney disease who was admitted October 11 with pain in her left gluteal area.  This has been ongoing for about 5 days prior to admission.  Family thought initially was a boil but it had worsened and enlarged.  On admit she was found to have a white count of 31,000 and was tachycardic.  CT scan showed a left gluteal abscess.  She was seen by surgery and taken to the OR on October 11 with findings of large fluctuant area with pinpoint opening that was draining some fluid spontaneously.  This was debrided and a large volume of malodorous black-gray serous fluid was obtained.  There was no evidence of severe necrosis.  She was started on ceftriaxone and metronidazole.  White count had initially come down to 17 but has come back up to 19.6.  Cultures are growing moderate actinomyces as well as abundant Bacteroides species.  Blood cultures have been negative. She has a history of allergies to penicillin She has been on ceftriaxone and flagyl. Wound has been improving with packing.   Past Medical History:  Diagnosis Date   Autoimmune liver disease    Cancer ([place])    Depression    Duodenal ulcer  perforation ([place]) 09/08/2008   GERD (gastroesophageal reflux disease)    H. 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) (HCC)    Primary sclerosing cholangitis 07/19/2014   RA (rheumatoid arthritis) ([place])    Past Surgical History:  Procedure Laterality Date   INCISION AND DRAINAGE ABSCESS Left 10/21/2020   Procedure: INCISION AND DRAINAGE ABSCESS;  Surgeon: Ronny Bacon, MD;  Location: [facility] ORS;  Service: General;  Laterality: Left;   PYLOROPLASTY  09/08/2008   with omental patch   TOTAL COLECTOMY  03/06/2018   total abdominal colectomy with end ileostomy   TUBAL LIGATION     Social History   Tobacco Use   Smoking status: Never   Smokeless tobacco: Never  Substance Use Topics   Alcohol use: Never   Drug use: Never   Family History  Problem Relation Age of Onset   Heart disease Mother    Lung disease Mother     Allergies:  Allergies  Allergen Reactions   Nsaids Swelling and Rash    Other Reaction: OTHER REACTION-GI BLEED   Penicillins Swelling   Percocet [Oxycodone-Acetaminophen] Nausea And Vomiting   Celebrex [Celecoxib] Swelling   Cortisone Swelling   Gabapentin Swelling   Methotrexate Derivatives Swelling   Nabumetone Other (See Comments)   Tomato Rash    Joint inflamation    Current antibiotics: Antibiotics Given (last 72 hours)     Date/Time Action Medication Dose Rate  10/24/20 1346 New Bag/Given   cefTRIAXone (ROCEPHIN) 1 g in sodium chloride 0.9 % 100 mL IVPB 1 g 200 mL/hr   10/24/20 1655 New Bag/Given   metroNIDAZOLE (FLAGYL) IVPB 500 mg 500 mg 100 mL/hr   10/25/20 0557 New Bag/Given   metroNIDAZOLE (FLAGYL) IVPB 500 mg 500 mg 100 mL/hr   10/25/20 1456 New Bag/Given   cefTRIAXone (ROCEPHIN) 1 g in sodium chloride 0.9 % 100 mL IVPB 1 g 200 mL/hr   10/25/20 1722 New Bag/Given   metroNIDAZOLE (FLAGYL) IVPB 500 mg 500 mg 100 mL/hr   10/26/20 0559 New  Bag/Given   metroNIDAZOLE (FLAGYL) IVPB 500 mg 500 mg 100 mL/hr   10/26/20 1417 New Bag/Given   cefTRIAXone (ROCEPHIN) 1 g in sodium chloride 0.9 % 100 mL IVPB 1 g 200 mL/hr   10/26/20 1717 New Bag/Given   metroNIDAZOLE (FLAGYL) IVPB 500 mg 500 mg 100 mL/hr   10/27/20 0523 New Bag/Given   metroNIDAZOLE (FLAGYL) IVPB 500 mg 500 mg 100 mL/hr   10/27/20 1138 New Bag/Given   cefTRIAXone (ROCEPHIN) 1 g in sodium chloride 0.9 % 100 mL IVPB 1 g 200 mL/hr       MEDICATIONS:  Chlorhexidine Gluconate Cloth  6 each Topical Q0600   cholecalciferol  2,000 Units Oral Daily   feeding supplement  237 mL Oral BID BM   omega-3 acid ethyl esters  1 capsule Oral Daily   pantoprazole  20 mg Oral BID   predniSONE  5 mg Oral Daily    Review of Systems - 11 systems reviewed and negative per HPI   OBJECTIVE: Temp:  [97.8 F (36.6 C)-98 F (36.7 C)] 97.8 F (36.6 C) (10/17 1213) Pulse Rate:  [65-80] 80 (10/17 1213) Resp:  [14-18] 14 (10/17 1213) BP: (140-157)/(72-83) 145/75 (10/17 1213) SpO2:  [100 %] 100 % (10/17 1213) Physical Exam  Constitutional:  oriented to person, place, and time. Frail. HENT: [place]/AT, PERRLA, no scleral icterus Mouth/Throat: Oropharynx is clear and moist. No oropharyngeal exudate.  Cardiovascular: Normal rate, regular rhythm and normal heart sounds. Pulmonary/Chest: Effort normal and breath sounds normal. No respiratory distress.  Abdominal: Soft. Bowel sounds are normal.  Ostomy  site nml Lymphadenopathy: no cervical adenopathy. No axillary adenopathy Neurological: alert and oriented to person, place, and time.  Skin: buttock crease with cleaned based abscess cavity. Min drainage. No fluctance Psychiatric: a normal mood and affect.  behavior is normal.    LABS: Results for orders placed or performed during the hospital encounter of 10/21/20 (from the past 48 hour(s))  CBC with Differential/Platelet     Status: Abnormal   Collection Time: 10/26/20  5:00 AM  Result Value  Ref Range   WBC 17.1 (H) 4.0 - 10.5 K/uL   RBC 3.04 (L) 3.87 - 5.11 MIL/uL   Hemoglobin 9.1 (L) 12.0 - 15.0 g/dL   HCT 27.8 (L) 36.0 - 46.0 %   MCV 91.4 80.0 - 100.0 fL   MCH 29.9 26.0 - 34.0 pg   MCHC 32.7 30.0 - 36.0 g/dL   RDW 17.6 (H) 11.5 - 15.5 %   Platelets 176 150 - 400 K/uL   nRBC 0.4 (H) 0.0 - 0.2 %   Neutrophils Relative % 70 %   Neutro Abs 12.2 (H) 1.7 - 7.7 K/uL   Lymphocytes Relative 9 %   Lymphs Abs 1.5 0.7 - 4.0 K/uL   Monocytes Relative 11 %   Monocytes Absolute 1.8 (H) 0.1 - 1.0 K/uL   Eosinophils Relative 1 %  Eosinophils Absolute 0.1 0.0 - 0.5 K/uL   Basophils Relative 1 %   Basophils Absolute 0.1 0.0 - 0.1 K/uL   WBC Morphology MILD LEFT SHIFT (1-5% METAS, OCC MYELO, OCC BANDS)    RBC Morphology MORPHOLOGY UNREMARKABLE    Smear Review Normal platelet morphology    Immature Granulocytes 8 %   Abs Immature Granulocytes 1.32 (H) 0.00 - 0.07 K/uL    Comment: Performed at [facility], [address] [place], [place] [address]  Glucose, capillary     Status: Abnormal   Collection Time: 10/26/20  8:41 AM  Result Value Ref Range   Glucose-Capillary 65 (L) 70 - 99 mg/dL    Comment: Glucose reference range applies only to samples taken after fasting for at least 8 hours.  Glucose, capillary     Status: None   Collection Time: 10/26/20  9:55 AM  Result Value Ref Range   Glucose-Capillary 88 70 - 99 mg/dL    Comment: Glucose reference range applies only to samples taken after fasting for at least 8 hours.  Glucose, capillary     Status: Abnormal   Collection Time: 10/27/20  8:07 AM  Result Value Ref Range   Glucose-Capillary 66 (L) 70 - 99 mg/dL    Comment: Glucose reference range applies only to samples taken after fasting for at least 8 hours.  CBC with Differential/Platelet     Status: Abnormal   Collection Time: 10/27/20  8:28 AM  Result Value Ref Range   WBC 19.6 (H) 4.0 - 10.5 K/uL   RBC 3.07 (L) 3.87 - 5.11 MIL/uL   Hemoglobin 9.5 (L) 12.0 -  15.0 g/dL   HCT 28.6 (L) 36.0 - 46.0 %   MCV 93.2 80.0 - 100.0 fL   MCH 30.9 26.0 - 34.0 pg   MCHC 33.2 30.0 - 36.0 g/dL   RDW 18.0 (H) 11.5 - 15.5 %   Platelets 207 150 - 400 K/uL   nRBC 0.4 (H) 0.0 - 0.2 %   Neutrophils Relative % 63 %   Neutro Abs 12.4 (H) 1.7 - 7.7 K/uL   Lymphocytes Relative 12 %   Lymphs Abs 2.3 0.7 - 4.0 K/uL   Monocytes Relative 12 %   Monocytes Absolute 2.3 (H) 0.1 - 1.0 K/uL   Eosinophils Relative 0 %   Eosinophils Absolute 0.1 0.0 - 0.5 K/uL   Basophils Relative 1 %   Basophils Absolute 0.2 (H) 0.0 - 0.1 K/uL   WBC Morphology MILD LEFT SHIFT (1-5% METAS, OCC MYELO, OCC BANDS)    Smear Review Normal platelet morphology    Immature Granulocytes 12 %   Abs Immature Granulocytes 2.39 (H) 0.00 - 0.07 K/uL   Polychromasia PRESENT     Comment: Performed at [facility], [address] [place], [place] [address]  Glucose, capillary     Status: None   Collection Time: 10/27/20  9:35 AM  Result Value Ref Range   Glucose-Capillary 98 70 - 99 mg/dL    Comment: Glucose reference range applies only to samples taken after fasting for at least 8 hours.   No components found for: ESR, C REACTIVE PROTEIN MICRO: Recent Results (from the past 720 hour(s))  Blood culture (single)     Status: None   Collection Time: 10/21/20  2:43 PM   Specimen: BLOOD  Result Value Ref Range Status   Specimen Description BLOOD PORTA CATH  Final   Special Requests   Final    BOTTLES DRAWN AEROBIC AND  ANAEROBIC Blood Culture adequate volume   Culture   Final    NO GROWTH 5 DAYS Performed at [facility], [address] [place], [place] [address]    Report Status 10/26/2020 FINAL  Final  Resp Panel by RT-PCR (Flu A&B, Covid) Nasopharyngeal Swab     Status: None   Collection Time: 10/21/20  4:27 PM   Specimen: Nasopharyngeal Swab; Nasopharyngeal(NP) swabs in vial transport medium  Result Value Ref Range Status   SARS Coronavirus 2 by RT PCR NEGATIVE NEGATIVE  Final    Comment: (NOTE) SARS-CoV-2 target nucleic acids are NOT DETECTED.  The SARS-CoV-2 RNA is generally detectable in upper respiratory specimens during the acute phase of infection. The lowest concentration of SARS-CoV-2 viral copies this assay can detect is 138 copies/mL. A negative result does not preclude SARS-Cov-2 infection and should not be used as the sole basis for treatment or other patient management decisions. A negative result may occur with  improper specimen collection/handling, submission of specimen other than nasopharyngeal swab, presence of viral mutation(s) within the areas targeted by this assay, and inadequate number of viral copies(<138 copies/mL). A negative result must be combined with clinical observations, patient history, and epidemiological information. The expected result is Negative.  Fact Sheet for Patients:  EntrepreneurPulse.com.au  Fact Sheet for Healthcare Providers:  IncredibleEmployment.be  This test is no t yet approved or cleared by the [place] FDA and  has been authorized for detection and/or diagnosis of SARS-CoV-2 by FDA under an Emergency Use Authorization (EUA). This EUA will remain  in effect (meaning this test can be used) for the duration of the COVID-19 declaration under Section 564(b)(1) of the Act, 21 U.S.C.section 360bbb-3(b)(1), unless the authorization is terminated  or revoked sooner.       Influenza A by PCR NEGATIVE NEGATIVE Final   Influenza B by PCR NEGATIVE NEGATIVE Final    Comment: (NOTE) The Xpert Xpress SARS-CoV-2/FLU/RSV plus assay is intended as an aid in the diagnosis of influenza from Nasopharyngeal swab specimens and should not be used as a sole basis for treatment. Nasal washings and aspirates are unacceptable for Xpert Xpress SARS-CoV-2/FLU/RSV testing.  Fact Sheet for Patients: EntrepreneurPulse.com.au  Fact Sheet for Healthcare  Providers: IncredibleEmployment.be  This test is not yet approved or cleared by the [place] FDA and has been authorized for detection and/or diagnosis of SARS-CoV-2 by FDA under an Emergency Use Authorization (EUA). This EUA will remain in effect (meaning this test can be used) for the duration of the COVID-19 declaration under Section 564(b)(1) of the Act, 21 U.S.C. section 360bbb-3(b)(1), unless the authorization is terminated or revoked.  Performed at [facility], [address] [place], [place] [address]   Aerobic/Anaerobic Culture w Gram Stain (surgical/deep wound)     Status: None   Collection Time: 10/21/20  6:40 PM   Specimen: PATH Other; Tissue  Result Value Ref Range Status   Specimen Description   Final    ABSCESS Performed at [facility], [address] [place], [place] [address]    Special Requests PERIRECTAL  Final   Gram Stain   Final    NO SQUAMOUS EPITHELIAL CELLS SEEN FEW WBC SEEN FEW GRAM NEGATIVE RODS MODERATE GRAM POSITIVE COCCI    Culture   Final    MODERATE ACTINOMYCES SPECIES Standardized susceptibility testing for this organism is not available. ABUNDANT BACTEROIDES FRAGILIS BETA LACTAMASE POSITIVE Performed at [facility], [place] [address], [place],  [address]  Report Status 10/25/2020 FINAL  Final  MRSA Next Gen by PCR, Nasal     Status: None   Collection Time: 10/21/20  7:46 PM   Specimen: Nasal Mucosa; Nasal Swab  Result Value Ref Range Status   MRSA by PCR Next Gen NOT DETECTED NOT DETECTED Final    Comment: (NOTE) The GeneXpert MRSA Assay (FDA approved for NASAL specimens only), is one component of a comprehensive MRSA colonization surveillance program. It is not intended to diagnose MRSA infection nor to guide or monitor treatment for MRSA infections. Test performance is not FDA approved in patients less than 69 years old. Performed at [facility], [address] [place], [place] [address]     IMAGING: CT PELVIS W CONTRAST  Result Date: 10/21/2020 CLINICAL DATA:  Evaluate abscess versus cellulitis along medial aspect of left gluteal fold EXAM: CT PELVIS WITH CONTRAST TECHNIQUE: Multidetector CT imaging of the pelvis was performed using the standard protocol following the bolus administration of intravenous contrast. CONTRAST:  11m OMNIPAQUE IOHEXOL 350 MG/ML SOLN COMPARISON:  CT abdomen/pelvis 04/29/2020 FINDINGS: Urinary Tract:  No abnormality visualized. Bowel: Postsurgical changes reflecting Hartmann's pouch and right lower quadrant and right lower quadrant ileostomy are seen. There is no abnormal bowel wall thickening or inflammatory change to the level imaged. Vascular/Lymphatic: There is mild calcified atherosclerotic plaque in the imaged abdominal aorta. The imaged venous structures appear patent. There is no lymphadenopathy in the pelvis. Reproductive: The right ovary is asymmetrically enlarged measuring 3.1 cm x 3.5 cm. There is a tubular hypodense structure in the right adnexa suspicious for hydrosalpinx. These findings are new since 04/29/2020. A calcified anterior uterine fibroid is again seen. There is no left adnexal mass. Other: Soft tissue nodularity is again seen along the left aspect of the omentum and mesenteric fat, incompletely imaged but also seen on the study from 04/29/2020. There is a 3.1 cm AP x 2.6 cm TV by 3.7 cm cc peripherally enhancing collection in the subcutaneous fat along the left aspect of the gluteal cleft. There is inflammatory change in the surrounding fat and overlying skin thickening. Musculoskeletal: There is no acute osseous abnormality or aggressive osseous lesion. IMPRESSION: 1. 3.1 cm x 2.6 cm x 3.7 cm peripherally enhancing collection in the subcutaneous fat along the left aspect of the gluteal cleft consistent with abscess. There is surrounding inflammatory change and overlying skin thickening consistent with  concomitant cellulitis. 2. Thickening and nodularity of the left omentum/mesentery again suspicious for metastatic disease, incompletely imaged but also present on the study from 04/29/2020. 3. Asymmetric enlargement of the right ovary with a tubular structure in the right adnexa suspicious for hydrosalpinx, new since 04/29/2020. Recommend further evaluation with pelvic ultrasound. Electronically Signed   By: PValetta MoleM.D.   On: 10/21/2020 15:59   DG Chest Portable 1 View  Result Date: 10/21/2020 CLINICAL DATA:  Mass in left buttocks causing discomfort and burning sensation. EXAM: PORTABLE CHEST 1 VIEW COMPARISON:  None. FINDINGS: A right-sided venous Port-A-Cath is seen with its distal tip noted approximate 1.4 cm proximal to the junction of the superior vena cava and right atrium. The heart size and mediastinal contours are within normal limits. Both lungs are clear. The visualized skeletal structures are unremarkable. IMPRESSION: No active disease. Electronically Signed   By: TVirgina NorfolkM.D.   On: 10/21/2020 15:12    Assessment:   BSena Hoopingarneris a 60y.o. female with colon cancer, s/p colectomy, now undergoing chemo admitted with a gluteal  buttock abscess. Began with skin breakdown and a boil per daughter. On admit wbc 31 K and CT with gluteal abscess. Drainage done and culture with actinomyces and bacteroides. Clinically responding to ceftriaxone and flagyl Has severe PCN allergy.  Recommendations Fortunately the abscess seems well drained.  At this point I would suggest continuing ceftriaxone and oral Flagyl while inpatient.  Given good drainage of wound and now packing with no areas of continued necrosis or infection I think we can treat with a 2 week minimum  oral course. She is PCN allergic At DC send home on oral doxy 100 mg bid and flagyl 500 mg BID for 14 days. If worsens will need IV abx.  If still open and draining at two week mark can repeat culture and extend  antibiotics. I should see in 2 weeks for followup. My office will call.  Thank you very much for allowing me to participate in the care of this patient. Please call with questions.   Cheral Marker. Ola Spurr, MD

## 2020-10-27 NOTE — Care Management Important Message (Signed)
Important Message  Patient Details  Name: Tanya Osborne MRN: [id number] Date of Birth: 06-Oct-1960   Medicare Important Message Given:  Yes     Cammeron Greis, Leroy Sea 10/27/2020, 1:57 PM

## 2020-10-27 NOTE — Progress Notes (Signed)
[facility] Hospital Day(s): 6.   Post op day(s): 6 Days Post-Op.   Interval History:  Patient seen and examined No acute events or new complaints overnight.  Patient reports she is feeling better and her dressing changes are getting easier No fever, chills  Cx grew actinomyces species; bacteroides fragilis;  She continues on Abx (Ceftriaxone / Flagyl) ID consult pending  Vital signs in last 24 hours: [min-max] current  Temp:  [97.7 F (36.5 C)-98 F (36.7 C)] 98 F (36.7 C) (10/17 0007) Pulse Rate:  [65-73] 65 (10/17 0007) Resp:  [14-18] 14 (10/17 0007) BP: (140-157)/(74-83) 140/82 (10/17 0007) SpO2:  [100 %] 100 % (10/17 0007)     Height: 5\' 7"  (170.2 cm) Weight: 72.6 kg BMI (Calculated): 25.06   Intake/Output last 2 shifts:  10/16 0701 - 10/17 0700 In: 320 [P.O.:120; IV Piggyback:200] Out: 1450 [Urine:900; Stool:550]   Physical Exam:  Constitutional: alert, cooperative and no distress  Respiratory: breathing non-labored at rest  Cardiovascular: tachycardic and sinus rhythm  Genitourinary: purewick present Integumentary: Left perirectal I&D, visible wound bed appears healthy, this is expectedly sore, no significant erythema, no further evidence of necrosis or abscess  Labs:  CBC Latest Ref Rng & Units 10/26/2020 10/25/2020 10/24/2020  WBC 4.0 - 10.5 K/uL 17.1(H) 19.3(H) 23.5(H)  Hemoglobin 12.0 - 15.0 g/dL 9.1(L) 8.8(L) 8.8(L)  Hematocrit 36.0 - 46.0 % 27.8(L) 26.5(L) 27.1(L)  Platelets 150 - 400 K/uL 176 151 150   CMP Latest Ref Rng & Units 10/25/2020 10/24/2020 10/23/2020  Glucose 70 - 99 mg/dL 80 100(H) 81  BUN 8 - 23 mg/dL 29(H) 26(H) 23  Creatinine 0.44 - 1.00 mg/dL 1.85(H) 1.95(H) 1.89(H)  Sodium 135 - 145 mmol/L 140 139 139  Potassium 3.5 - 5.1 mmol/L 3.8 3.5 3.8  Chloride 98 - 111 mmol/L 113(H) 111 110  CO2 22 - 32 mmol/L 21(L) 21(L) 21(L)  Calcium 8.9 - 10.3 mg/dL 8.4(L) 8.4(L) 8.5(L)  Total Protein 6.5 - 8.1  g/dL - - -  Total Bilirubin 0.3 - 1.2 mg/dL - - -  Alkaline Phos 38 - 126 U/L - - -  AST 15 - 41 U/L - - -  ALT 0 - 44 U/L - - -     Imaging studies: No new pertinent imaging studies   Assessment/Plan:  60 y.o. female 6 Days Post-Op s/p incision and drainage for left perirectal abscess, complicated by pertinent comorbidities including colon CA slp colectomy, ileostomy, and adjuvant chemotherapy.              - Wound Care: Pack wound daily with vaginal packing -vs- Kerlix gauze, cover and secure. Change as needed.             - Monitor renal function; UO  - IV Abx (Rocephin, Flagyl); Cx reviewed; ID consult pending              - Pain control prn              - Worked with therapy; recommending SNF but patient prefers home health             - Further management per primary team; I will follow peripherally, I will be available to assist with dressing changes prn    -  Discharge Planning: Stable for discharge from surgical perspective once medically cleared, ID consult for Abx pending, dressing changes as described daily, follow up in 1-2 weeks with general surgery   All of the above  findings and recommendations were discussed with the patient, patient's family (daughter via telephone), and the medical team, and all of patient's and family's questions were answered to their expressed satisfaction.  -- Edison Simon, PA-C [facility] 10/27/2020, 7:50 AM [phone] M-F: 7am - 4pm

## 2020-10-27 NOTE — Progress Notes (Addendum)
Progress Note    Tanya Osborne  BJS:[id number] DOB: 08-28-1960  DOA: 10/21/2020 PCP: Annice Needy, MD      Brief Narrative:    Medical records reviewed and are as summarized below:  Tanya Osborne is a 59 y.o. female with medical history significant of rheumatoid arthritis on prednisone chronically, hypertension, hyperlipidemia, GERD, primary sclerosing cholangitis, CKD-IV, anemia, perforated duodenal ulcer, autoimmune liver disease, small bowel obstruction, colon cancer (s/p colectomy and end ileostomy at [facility] in February of 2020, on chemotherapy and recent dose was 2 weeks prior to admission).  She presented to the hospital because of pain in the left buttock.      Assessment/Plan:   Principal Problem:   Abscess, gluteal, left Active Problems:   RA (rheumatoid arthritis) (HCC)   Essential hypertension   History of colon cancer   Iron deficiency anemia   GERD (gastroesophageal reflux disease)   CKD (chronic kidney disease), stage IIIb   Sepsis (HCC)   Malnutrition of moderate degree   Nutrition Problem: Moderate Malnutrition (in the context of chronic illness) Etiology: cancer and cancer related treatments  Signs/Symptoms:  (mild/moderate muscle and fat depletions and severe weight loss of 14.2% x 6 months)   Body mass index is 25.07 kg/m.  Severe sepsis secondary to left perirectal/gluteal abscess, severe leukocytosis, immunocompromised state: WBC is starting to trend up.  Wound culture showed moderate actinomyces species and abundant Bacteroides fragilis.  Consulted Dr. Ola Spurr, ID specialist, for recommendations on antibiotics. Continue IV ceftriaxone and Flagyl for now.  Recurrent hypoglycemia: Asymptomatic. Encourage adequate oral intake.  Continue nutritional supplements   CKD stage IV: Creatinine is stable.  Rheumatoid arthritis, autoimmune liver disease, primary sclerosing cholangitis: Continue low-dose prednisone  Probable peripheral  neuropathy: Analgesics as needed  Stage III colon cancer (February 2020) s/p 12 cycles of FOLFOX chemotherapy.  She received chemotherapy about 2 weeks prior to admission. 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  Plan discussed with Tanya Osborne and Tanya Osborne over the phone.  PT recommended discharge to SNF.  However, her daughters want her to be discharged home with home health therapy.  Diet Order             Diet regular Room service appropriate? Yes; Fluid consistency: Thin  Diet effective now                      Consultants: General surgeon  Procedures: I&D of left perirectal abscess    Medications:    Chlorhexidine Gluconate Cloth  6 each Topical Q0600   cholecalciferol  2,000 Units Oral Daily   feeding supplement  237 mL Oral BID BM   omega-3 acid ethyl esters  1 capsule Oral Daily   pantoprazole  20 mg Oral BID   predniSONE  5 mg Oral Daily   Continuous Infusions:  cefTRIAXone (ROCEPHIN)  IV 1 g (10/27/20 1138)   lactated ringers Stopped (10/21/20 2101)   metronidazole Stopped (10/27/20 0730)     Anti-infectives (From admission, onward)    Start     Dose/Rate Route Frequency Ordered Stop   10/27/20 1200  cefTRIAXone (ROCEPHIN) 1 g in sodium chloride 0.9 % 100 mL IVPB        1 g 200 mL/hr over 30 Minutes Intravenous Every 24 hours 10/26/20 1621     10/26/20 1800  metroNIDAZOLE (FLAGYL) IVPB 500 mg        500 mg 100 mL/hr over 60 Minutes  Intravenous Every 12 hours 10/26/20 1621     10/22/20 1600  vancomycin (VANCOREADY) IVPB 750 mg/150 mL  Status:  Discontinued        750 mg 150 mL/hr over 60 Minutes Intravenous Every 24 hours 10/21/20 1701 10/22/20 1055   10/22/20 1400  cefTRIAXone (ROCEPHIN) 1 g in sodium chloride 0.9 % 100 mL IVPB  Status:  Discontinued        1 g 200 mL/hr over 30 Minutes Intravenous Every 24 hours 10/21/20 1652 10/26/20 1516   10/21/20 1700  metroNIDAZOLE (FLAGYL) IVPB  500 mg  Status:  Discontinued        500 mg 100 mL/hr over 60 Minutes Intravenous Every 12 hours 10/21/20 1652 10/26/20 1516   10/21/20 1500  vancomycin (VANCOREADY) IVPB 1500 mg/300 mL        1,500 mg 150 mL/hr over 120 Minutes Intravenous  Once 10/21/20 1442 10/21/20 2103   10/21/20 1445  cefTRIAXone (ROCEPHIN) 1 g in sodium chloride 0.9 % 100 mL IVPB        1 g 200 mL/hr over 30 Minutes Intravenous  Once 10/21/20 1442 10/21/20 1612              Family Communication/Anticipated D/C date and plan/Code Status   DVT prophylaxis: SCDs Start: 10/21/20 2036     Code Status: Full Code  Family Communication: Plan was discussed with Tanya Osborne and Tanya Osborne (daughters) over speaker phone   Status is: Inpatient  Remains inpatient appropriate because:IV treatments appropriate due to intensity of illness or inability to take PO  Dispo: The patient is from: Home              Anticipated d/c is to: Home              Patient currently is not medically stable to d/c.   Difficult to place patient No           Subjective:   No new complaints.  She still has pain in the left buttock.  She still has neuropathic pain in the hands and feet.  She is concerned that she is going to be discharged from the hospital too soon because she thinks she is too weak to go home.  Objective:    Vitals:   10/26/20 1958 10/27/20 0007 10/27/20 0805 10/27/20 1213  BP: (!) 156/74 140/82 (!) 151/72 (!) 145/75  Pulse: 70 65 69 80  Resp: 16 14 16 14   Temp: 97.9 F (36.6 C) 98 F (36.7 C) 97.8 F (36.6 C) 97.8 F (36.6 C)  TempSrc: Oral Oral    SpO2: 100% 100% 100% 100%  Weight:      Height:       No data found.   Intake/Output Summary (Last 24 hours) at 10/27/2020 1502 Last data filed at 10/27/2020 1414 Gross per 24 hour  Intake 440 ml  Output 1300 ml  Net -860 ml   Filed Weights   10/21/20 1044 10/21/20 1945  Weight: 65.8 kg 72.6 kg    Exam:  GEN: NAD SKIN: Dressing on left  buttock surgical wound is clean, dry and intact EYES: No pallor or icterus ENT: MMM CV: RRR PULM: CTA B ABD: soft, ND, NT, +BS, + colostomy CNS: AAO x 3, non focal EXT: No edema or tenderness                Data Reviewed:   I have personally reviewed following labs and imaging studies:  Labs: Labs show the following:  Basic Metabolic Panel: Recent Labs  Lab 10/21/20 2029 10/22/20 0423 10/23/20 0606 10/24/20 0600 10/25/20 0600  NA 137 138 139 139 140  K 3.8 3.9 3.8 3.5 3.8  CL 109 110 110 111 113*  CO2 17* 18* 21* 21* 21*  GLUCOSE 130* 70 81 100* 80  BUN 17 16 23  26* 29*  CREATININE 1.50* 1.54* 1.89* 1.95* 1.85*  CALCIUM 8.1* 8.0* 8.5* 8.4* 8.4*  MG 1.4* 2.1  --   --   --    GFR Estimated Creatinine Clearance: 30.7 mL/min (A) (by C-G formula based on SCr of 1.85 mg/dL (H)). Liver Function Tests: No results for input(s): AST, ALT, ALKPHOS, BILITOT, PROT, ALBUMIN in the last 168 hours. No results for input(s): LIPASE, AMYLASE in the last 168 hours. No results for input(s): AMMONIA in the last 168 hours. Coagulation profile Recent Labs  Lab 10/21/20 1430  INR 1.2    CBC: Recent Labs  Lab 10/23/20 0606 10/24/20 0600 10/25/20 0600 10/26/20 0500 10/27/20 0828  WBC 29.9* 23.5* 19.3* 17.1* 19.6*  NEUTROABS 26.2* 21.0* 15.8* 12.2* 12.4*  HGB 9.1* 8.8* 8.8* 9.1* 9.5*  HCT 27.5* 27.1* 26.5* 27.8* 28.6*  MCV 94.5 92.2 93.0 91.4 93.2  PLT 130* 150 151 176 207   Cardiac Enzymes: No results for input(s): CKTOTAL, CKMB, CKMBINDEX, TROPONINI in the last 168 hours. BNP (last 3 results) No results for input(s): PROBNP in the last 8760 hours. CBG: Recent Labs  Lab 10/25/20 0913 10/26/20 0841 10/26/20 0955 10/27/20 0807 10/27/20 0935  GLUCAP 74 65* 88 66* 98   D-Dimer: No results for input(s): DDIMER in the last 72 hours. Hgb A1c: No results for input(s): HGBA1C in the last 72 hours. Lipid Profile: No results for input(s): CHOL, HDL, LDLCALC,  TRIG, CHOLHDL, LDLDIRECT in the last 72 hours. Thyroid function studies: No results for input(s): TSH, T4TOTAL, T3FREE, THYROIDAB in the last 72 hours.  Invalid input(s): FREET3 Anemia work up: No results for input(s): VITAMINB12, FOLATE, FERRITIN, TIBC, IRON, RETICCTPCT in the last 72 hours. Sepsis Labs: Recent Labs  Lab 10/21/20 1430 10/21/20 1635 10/21/20 2029 10/21/20 2336 10/22/20 0423 10/23/20 0606 10/24/20 0600 10/25/20 0600 10/26/20 0500 10/27/20 0828  PROCALCITON 3.10  --   --   --  8.58 10.74  --   --   --   --   WBC 31.5*  --  26.6*  --  28.6* 29.9* 23.5* 19.3* 17.1* 19.6*  LATICACIDVEN 1.2 3.7* 5.4* 1.6  --   --   --   --   --   --     Microbiology Recent Results (from the past 240 hour(s))  Blood culture (single)     Status: None   Collection Time: 10/21/20  2:43 PM   Specimen: BLOOD  Result Value Ref Range Status   Specimen Description BLOOD PORTA CATH  Final   Special Requests   Final    BOTTLES DRAWN AEROBIC AND ANAEROBIC Blood Culture adequate volume   Culture   Final    NO GROWTH 5 DAYS Performed at [facility], [address] [place], [place] [address]    Report Status 10/26/2020 FINAL  Final  Resp Panel by RT-PCR (Flu A&B, Covid) Nasopharyngeal Swab     Status: None   Collection Time: 10/21/20  4:27 PM   Specimen: Nasopharyngeal Swab; Nasopharyngeal(NP) swabs in vial transport medium  Result Value Ref Range Status   SARS Coronavirus 2 by RT PCR NEGATIVE NEGATIVE Final    Comment: (NOTE) SARS-CoV-2  target nucleic acids are NOT DETECTED.  The SARS-CoV-2 RNA is generally detectable in upper respiratory specimens during the acute phase of infection. The lowest concentration of SARS-CoV-2 viral copies this assay can detect is 138 copies/mL. A negative result does not preclude SARS-Cov-2 infection and should not be used as the sole basis for treatment or other patient management decisions. A negative result may occur with  improper  specimen collection/handling, submission of specimen other than nasopharyngeal swab, presence of viral mutation(s) within the areas targeted by this assay, and inadequate number of viral copies(<138 copies/mL). A negative result must be combined with clinical observations, patient history, and epidemiological information. The expected result is Negative.  Fact Sheet for Patients:  EntrepreneurPulse.com.au  Fact Sheet for Healthcare Providers:  IncredibleEmployment.be  This test is no t yet approved or cleared by the [place] FDA and  has been authorized for detection and/or diagnosis of SARS-CoV-2 by FDA under an Emergency Use Authorization (EUA). This EUA will remain  in effect (meaning this test can be used) for the duration of the COVID-19 declaration under Section 564(b)(1) of the Act, 21 U.S.C.section 360bbb-3(b)(1), unless the authorization is terminated  or revoked sooner.       Influenza A by PCR NEGATIVE NEGATIVE Final   Influenza B by PCR NEGATIVE NEGATIVE Final    Comment: (NOTE) The Xpert Xpress SARS-CoV-2/FLU/RSV plus assay is intended as an aid in the diagnosis of influenza from Nasopharyngeal swab specimens and should not be used as a sole basis for treatment. Nasal washings and aspirates are unacceptable for Xpert Xpress SARS-CoV-2/FLU/RSV testing.  Fact Sheet for Patients: EntrepreneurPulse.com.au  Fact Sheet for Healthcare Providers: IncredibleEmployment.be  This test is not yet approved or cleared by the [place] FDA and has been authorized for detection and/or diagnosis of SARS-CoV-2 by FDA under an Emergency Use Authorization (EUA). This EUA will remain in effect (meaning this test can be used) for the duration of the COVID-19 declaration under Section 564(b)(1) of the Act, 21 U.S.C. section 360bbb-3(b)(1), unless the authorization is terminated or revoked.  Performed at  [facility], [address], [place], [place] [address]   Aerobic/Anaerobic Culture w Gram Stain (surgical/deep wound)     Status: None   Collection Time: 10/21/20  6:40 PM   Specimen: PATH Other; Tissue  Result Value Ref Range Status   Specimen Description   Final    ABSCESS Performed at [facility], [address], [place], [place] [address]    Special Requests PERIRECTAL  Final   Gram Stain   Final    NO SQUAMOUS EPITHELIAL CELLS SEEN FEW WBC SEEN FEW GRAM NEGATIVE RODS MODERATE GRAM POSITIVE COCCI    Culture   Final    MODERATE ACTINOMYCES SPECIES Standardized susceptibility testing for this organism is not available. ABUNDANT BACTEROIDES FRAGILIS BETA LACTAMASE POSITIVE Performed at [facility], [place] [address] [place], [place] [address]    Report Status 10/25/2020 FINAL  Final  MRSA Next Gen by PCR, Nasal     Status: None   Collection Time: 10/21/20  7:46 PM   Specimen: Nasal Mucosa; Nasal Swab  Result Value Ref Range Status   MRSA by PCR Next Gen NOT DETECTED NOT DETECTED Final    Comment: (NOTE) The GeneXpert MRSA Assay (FDA approved for NASAL specimens only), is one component of a comprehensive MRSA colonization surveillance program. It is not intended to diagnose MRSA infection nor to guide or monitor treatment for MRSA infections. Test performance is  not FDA approved in patients less than 17 years old. Performed at [facility], [address] [place], [place] [address]     Procedures and diagnostic studies:  No results found.             LOS: 6 days   Tanya Osborne  [facility]   Pager on [email or web address]. If 7PM-7AM, please contact night-coverage at [email or web address]     10/27/2020, 3:02 PM

## 2020-10-28 LAB — CBC WITH DIFFERENTIAL/PLATELET
Abs Immature Granulocytes: 2.01 10*3/uL — ABNORMAL HIGH (ref 0.00–0.07)
Basophils Absolute: 0.1 10*3/uL (ref 0.0–0.1)
Basophils Relative: 0 %
Eosinophils Absolute: 0.1 10*3/uL (ref 0.0–0.5)
Eosinophils Relative: 1 %
HCT: 27.5 % — ABNORMAL LOW (ref 36.0–46.0)
Hemoglobin: 9.1 g/dL — ABNORMAL LOW (ref 12.0–15.0)
Immature Granulocytes: 12 %
Lymphocytes Relative: 8 %
Lymphs Abs: 1.4 10*3/uL (ref 0.7–4.0)
MCH: 30.8 pg (ref 26.0–34.0)
MCHC: 33.1 g/dL (ref 30.0–36.0)
MCV: 93.2 fL (ref 80.0–100.0)
Monocytes Absolute: 1.6 10*3/uL — ABNORMAL HIGH (ref 0.1–1.0)
Monocytes Relative: 9 %
Neutro Abs: 11.5 10*3/uL — ABNORMAL HIGH (ref 1.7–7.7)
Neutrophils Relative %: 70 %
Platelets: 199 10*3/uL (ref 150–400)
RBC: 2.95 MIL/uL — ABNORMAL LOW (ref 3.87–5.11)
RDW: 18.2 % — ABNORMAL HIGH (ref 11.5–15.5)
Smear Review: NORMAL
WBC: 16.7 10*3/uL — ABNORMAL HIGH (ref 4.0–10.5)
nRBC: 0.2 % (ref 0.0–0.2)

## 2020-10-28 LAB — BASIC METABOLIC PANEL
Anion gap: 6 (ref 5–15)
BUN: 31 mg/dL — ABNORMAL HIGH (ref 8–23)
CO2: 23 mmol/L (ref 22–32)
Calcium: 8.9 mg/dL (ref 8.9–10.3)
Chloride: 111 mmol/L (ref 98–111)
Creatinine, Ser: 1.62 mg/dL — ABNORMAL HIGH (ref 0.44–1.00)
GFR, Estimated: 36 mL/min — ABNORMAL LOW (ref >60)
Glucose, Bld: 123 mg/dL — ABNORMAL HIGH (ref 70–99)
Potassium: 4.2 mmol/L (ref 3.5–5.1)
Sodium: 140 mmol/L (ref 135–145)

## 2020-10-28 MED ORDER — ENSURE MAX PROTEIN PO LIQD
11.0000 [oz_av] | Freq: Two times a day (BID) | ORAL | Status: DC
  Administered 2020-10-28 – 2020-10-29 (×2): 11 [oz_av] via ORAL
  Filled 2020-10-28: qty 330

## 2020-10-28 MED ORDER — ADULT MULTIVITAMIN W/MINERALS CH
1.0000 | ORAL_TABLET | Freq: Every day | ORAL | Status: DC
  Administered 2020-10-28 – 2020-10-29 (×2): 1 via ORAL
  Filled 2020-10-28 (×2): qty 1

## 2020-10-28 NOTE — Progress Notes (Signed)
[facility] PROGRESS NOTE Date of Admission:  10/21/2020     ID: Tanya Osborne is a 60 y.o. female with  Buttock abscess Principal Problem:   Abscess, gluteal, left Active Problems:   RA (rheumatoid arthritis) (HCC)   Essential hypertension   History of colon cancer   Iron deficiency anemia   GERD (gastroesophageal reflux disease)   CKD (chronic kidney disease), stage IIIb   Sepsis (HCC)   Malnutrition of moderate degree   Subjective: No fevers, wbc 16.7 from 19.6.   ROS  Eleven systems are reviewed and negative except per hpi  Medications:  Antibiotics Given (last 72 hours)     Date/Time Action Medication Dose Rate   10/25/20 1456 New Bag/Given   cefTRIAXone (ROCEPHIN) 1 g in sodium chloride 0.9 % 100 mL IVPB 1 g 200 mL/hr   10/25/20 1722 New Bag/Given   metroNIDAZOLE (FLAGYL) IVPB 500 mg 500 mg 100 mL/hr   10/26/20 0559 New Bag/Given   metroNIDAZOLE (FLAGYL) IVPB 500 mg 500 mg 100 mL/hr   10/26/20 1417 New Bag/Given   cefTRIAXone (ROCEPHIN) 1 g in sodium chloride 0.9 % 100 mL IVPB 1 g 200 mL/hr   10/26/20 1717 New Bag/Given   metroNIDAZOLE (FLAGYL) IVPB 500 mg 500 mg 100 mL/hr   10/27/20 0523 New Bag/Given   metroNIDAZOLE (FLAGYL) IVPB 500 mg 500 mg 100 mL/hr   10/27/20 1138 New Bag/Given   cefTRIAXone (ROCEPHIN) 1 g in sodium chloride 0.9 % 100 mL IVPB 1 g 200 mL/hr   10/27/20 1756 New Bag/Given   metroNIDAZOLE (FLAGYL) IVPB 500 mg 500 mg 100 mL/hr   10/28/20 0600 New Bag/Given   metroNIDAZOLE (FLAGYL) IVPB 500 mg 500 mg 100 mL/hr       Chlorhexidine Gluconate Cloth  6 each Topical Q0600   cholecalciferol  2,000 Units Oral Daily   feeding supplement  237 mL Oral BID BM   omega-3 acid ethyl esters  1 capsule Oral Daily   pantoprazole  20 mg Oral BID   predniSONE  5 mg Oral Daily    Objective: Vital signs in last 24 hours: Temp:  [97.8 F (36.6 C)-98.5 F (36.9 C)] 98.2 F (36.8 C) (10/18 0906) Pulse Rate:  [73-80] 73 (10/18  0906) Resp:  [14-22] 14 (10/18 0906) BP: (128-161)/(58-91) 128/67 (10/18 0906) SpO2:  [100 %] 100 % (10/18 0906) Constitutional:  oriented to person, place, and time. Frail. HENT: [place]/AT, PERRLA, no scleral icterus Mouth/Throat: Oropharynx is clear and moist. No oropharyngeal exudate.  Cardiovascular: Normal rate, regular rhythm and normal heart sounds. Pulmonary/Chest: Effort normal and breath sounds normal. No respiratory distress.  Abdominal: Soft. Bowel sounds are normal.  Ostomy  site nml Lymphadenopathy: no cervical adenopathy. No axillary adenopathy Neurological: alert and oriented to person, place, and time.  Skin: site covered and packed Psychiatric: a normal mood and affect.  behavior is normal.      Lab Results Recent Labs    10/27/20 0828 10/28/20 0510  WBC 19.6* 16.7*  HGB 9.5* 9.1*  HCT 28.6* 27.5*  NA  --  140  K  --  4.2  CL  --  111  CO2  --  23  BUN  --  31*  CREATININE  --  1.62*    Microbiology: Results for orders placed or performed during the hospital encounter of 10/21/20  Blood culture (single)     Status: None   Collection Time: 10/21/20  2:43 PM   Specimen: BLOOD  Result Value Ref Range  Status   Specimen Description BLOOD PORTA CATH  Final   Special Requests   Final    BOTTLES DRAWN AEROBIC AND ANAEROBIC Blood Culture adequate volume   Culture   Final    NO GROWTH 5 DAYS Performed at [facility], [place], [place] [address]    Report Status 10/26/2020 FINAL  Final  Resp Panel by RT-PCR (Flu A&B, Covid) Nasopharyngeal Swab     Status: None   Collection Time: 10/21/20  4:27 PM   Specimen: Nasopharyngeal Swab; Nasopharyngeal(NP) swabs in vial transport medium  Result Value Ref Range Status   SARS Coronavirus 2 by RT PCR NEGATIVE NEGATIVE Final    Comment: (NOTE) SARS-CoV-2 target nucleic acids are NOT DETECTED.  The SARS-CoV-2 RNA is generally detectable in upper respiratory specimens during the acute phase of  infection. The lowest concentration of SARS-CoV-2 viral copies this assay can detect is 138 copies/mL. A negative result does not preclude SARS-Cov-2 infection and should not be used as the sole basis for treatment or other patient management decisions. A negative result may occur with  improper specimen collection/handling, submission of specimen other than nasopharyngeal swab, presence of viral mutation(s) within the areas targeted by this assay, and inadequate number of viral copies(<138 copies/mL). A negative result must be combined with clinical observations, patient history, and epidemiological information. The expected result is Negative.  Fact Sheet for Patients:  EntrepreneurPulse.com.au  Fact Sheet for Healthcare Providers:  IncredibleEmployment.be  This test is no t yet approved or cleared by the [place] FDA and  has been authorized for detection and/or diagnosis of SARS-CoV-2 by FDA under an Emergency Use Authorization (EUA). This EUA will remain  in effect (meaning this test can be used) for the duration of the COVID-19 declaration under Section 564(b)(1) of the Act, 21 U.S.C.section 360bbb-3(b)(1), unless the authorization is terminated  or revoked sooner.       Influenza A by PCR NEGATIVE NEGATIVE Final   Influenza B by PCR NEGATIVE NEGATIVE Final    Comment: (NOTE) The Xpert Xpress SARS-CoV-2/FLU/RSV plus assay is intended as an aid in the diagnosis of influenza from Nasopharyngeal swab specimens and should not be used as a sole basis for treatment. Nasal washings and aspirates are unacceptable for Xpert Xpress SARS-CoV-2/FLU/RSV testing.  Fact Sheet for Patients: EntrepreneurPulse.com.au  Fact Sheet for Healthcare Providers: IncredibleEmployment.be  This test is not yet approved or cleared by the [place] FDA and has been authorized for detection and/or diagnosis of SARS-CoV-2  by FDA under an Emergency Use Authorization (EUA). This EUA will remain in effect (meaning this test can be used) for the duration of the COVID-19 declaration under Section 564(b)(1) of the Act, 21 U.S.C. section 360bbb-3(b)(1), unless the authorization is terminated or revoked.  Performed at [facility], [address], [place], [place] [address]   Aerobic/Anaerobic Culture w Gram Stain (surgical/deep wound)     Status: None   Collection Time: 10/21/20  6:40 PM   Specimen: PATH Other; Tissue  Result Value Ref Range Status   Specimen Description   Final    ABSCESS Performed at [facility], [address] [place], [place] [address]    Special Requests PERIRECTAL  Final   Gram Stain   Final    NO SQUAMOUS EPITHELIAL CELLS SEEN FEW WBC SEEN FEW GRAM NEGATIVE RODS MODERATE GRAM POSITIVE COCCI    Culture   Final    MODERATE ACTINOMYCES SPECIES Standardized susceptibility testing for this organism is  not available. ABUNDANT BACTEROIDES FRAGILIS BETA LACTAMASE POSITIVE Performed at [facility], [place] [address], [place], [place] [address]    Report Status 10/25/2020 FINAL  Final  MRSA Next Gen by PCR, Nasal     Status: None   Collection Time: 10/21/20  7:46 PM   Specimen: Nasal Mucosa; Nasal Swab  Result Value Ref Range Status   MRSA by PCR Next Gen NOT DETECTED NOT DETECTED Final    Comment: (NOTE) The GeneXpert MRSA Assay (FDA approved for NASAL specimens only), is one component of a comprehensive MRSA colonization surveillance program. It is not intended to diagnose MRSA infection nor to guide or monitor treatment for MRSA infections. Test performance is not FDA approved in patients less than 70 years old. Performed at [facility], [address] [place], [place] [address]     Studies/Results: No results found.  Assessment/Plan: Tomeca Helm is a 60 y.o. female with colon cancer, s/p colectomy, now undergoing chemo admitted  with a gluteal buttock abscess. Began with skin breakdown and a boil per daughter. On admit wbc 31 K and CT with gluteal abscess. Drainage done and culture with actinomyces and bacteroides. Clinically responding to ceftriaxone and flagyl Has severe PCN allergy.   Recommendations Fortunately the abscess seems well drained.  At this point I would suggest continuing ceftriaxone and oral Flagyl while inpatient.  Given good drainage of wound and now packing with no areas of continued necrosis or infection I think we can treat with a 2 week minimum  oral course. She is PCN allergic At DC send home on oral doxy 100 mg bid and flagyl 500 mg BID for 14 days. If worsens will need IV abx.  If still open and draining at two week mark can repeat culture and extend antibiotics. I should see in 2 weeks for followup. My office will call.  Thank you very much for the consult. Will follow with you.  Leonel Ramsay   10/28/2020, 11:25 AM

## 2020-10-28 NOTE — Progress Notes (Signed)
Nutrition Follow-up  DOCUMENTATION CODES:  Non-severe (moderate) malnutrition in context of chronic illness  INTERVENTION:  Continue regular diet Magic cup BID with meals, each supplement provides 290 kcal and 9 grams of protein [employer] Breakfast daily each packet provides 130kcal and 5g protein + milk Change ensure enlive to Ensure Max po BID, each supplement provides 150 kcal and 30 grams of protein.  MVI with minerals daily.  NUTRITION DIAGNOSIS:  Moderate Malnutrition (in the context of chronic illness) related to cancer and cancer related treatments as evidenced by  (mild/moderate muscle and fat depletions and severe weight loss of 14.2% x 6 months).  GOAL:  Patient will meet greater than or equal to 90% of their needs  MONITOR:  PO intake, Supplement acceptance, Labs  REASON FOR ASSESSMENT:  Malnutrition Screening Tool    ASSESSMENT:  60 y.o. female with hx of stage IV colon cancer (undergoing palliative chemo), s/p colectomy and end ileostomy, GERD, HLD, and CKD IV presented to ED from home for evaluation of increasing weakness and left-sided gluteal swelling.  Surgery consulted and pt underwent I&D of the left buttocks 10/11 and a large volume of malodorous black-gray serous fluid was drained.   Reviewed intake and weight since last assessment. Intake appears poor and pt has experienced hypoglycemia this admission. No new weight obtained, will request to compare to outpatient weights.  Pt resting in bed at the time of visit. Reports feeling improved from last assessment, pain is under better control. Breakfast tray at bedside, noted to be about 50% consumed. Magic Cup and carnation instant breakfast untouched. Asked pt about supplement consumption. Pt has not been consuming supplements, expresses concerns with the sugar content multiple times. Reminded pt about the energy and protein content iin the supplements being the reason they were added due to her previous  refusal of ensure (due to sweetness). Pt agreeable to supplements once they were explained again, but request only 1 per tray. Encouraged pt to eat protein foods on tray first to encourage adequate intake and wound healing. Will also change current ensure enlive order to ensure max to reduce sugar content and provide more protein.   Average Meal Intake: 10/12-10/18: 38% intake x 7 recorded meals (10-75%)  Nutritionally Relevant Medications: Scheduled Meds:  cholecalciferol  2,000 Units Oral Daily   feeding supplement  237 mL Oral BID BM   omega-3 acid ethyl esters  1 capsule Oral Daily   pantoprazole  20 mg Oral BID   predniSONE  5 mg Oral Daily   Continuous Infusions:  cefTRIAXone (ROCEPHIN)  IV 1 g (10/27/20 1138)   metronidazole 500 mg (10/28/20 0600)   PRN Meds: ondansetron  Labs Reviewed: BUN 31, Creatinine 1.62 SBG ranges from 66-123 mg/dL over the last 24 hours  NUTRITION - FOCUSED PHYSICAL EXAM: Flowsheet Row Most Recent Value  Orbital Region Mild depletion  Upper Arm Region No depletion  Thoracic and Lumbar Region Moderate depletion  Buccal Region Mild depletion  Temple Region No depletion  Clavicle Bone Region Mild depletion  Clavicle and Acromion Bone Region Moderate depletion  Scapular Bone Region Moderate depletion  Dorsal Hand Mild depletion  Patellar Region Moderate depletion  Anterior Thigh Region Moderate depletion  Posterior Calf Region Moderate depletion  Edema (RD Assessment) None  Hair Reviewed  Eyes Reviewed  Mouth Reviewed  Skin Reviewed  Nails Reviewed    Diet Order:   Diet Order             Diet regular Room service appropriate?  Yes; Fluid consistency: Thin  Diet effective now                   EDUCATION NEEDS:  Education needs have been addressed  Skin:  Skin Assessment: Skin Integrity Issues: Skin Integrity Issues:: Incisions Incisions: rectal area  Last BM:  10/17 via ostomy  Height:  Ht Readings from Last 1 Encounters:   10/21/20 5\' 7"  (1.702 m)   Weight:  Wt Readings from Last 1 Encounters:  10/28/20 74.8 kg   Ideal Body Weight:  61.4 kg  BMI:  Body mass index is 25.83 kg/m.  Estimated Nutritional Needs:  Kcal:  1800-2000 kcal/d Protein:  90-100 g/d Fluid:  1.8-2L/d   Ranell Patrick, RD, LDN Clinical Dietitian RD pager # available in [facility]  After hours/weekend pager # available in [facility]

## 2020-10-28 NOTE — Progress Notes (Signed)
Occupational Therapy Treatment Patient Details Name: Tanya Osborne MRN: [id number] DOB: 1960-02-24 Today's Date: 10/28/2020   History of present illness 60 y.o with significant PMH of stage III colon cancer s/p total colectomy and ileostomy in February, SBO secondary to postsurgical adhesion, perforated duodenal ulcer and chronic stage IV kidney disease who presented to the ED with chief complaints of left gluteal mass associated with the discomfort and burning sensation. S/P I &D 10/21/20   OT comments  Tanya Osborne was seen for OT tx this session. Coordinated with RN for pain management prior to session. Pt is received semi-supine in bed. She expresses concerns over potential DC which she feels is "too early". Encourage to discuss concerns with nsg/MD and family. Pt is eager to participate in functional mobility. Therapist facilitates functional activities as described below. Pt requires SUPERVISION for functional mobility, toilet transfer, and toileting this date. Increased time/effort to perform with cueing for safety t/o session. Pt educated on falls prevention, energy conservation strategies, and routines modifications to support safety and functional independence during ADL Management. Pt expresses significant satisfaction at her abilities this date and states she feels more comfortable with potential DC home if she has the right support. Will continue to follow POC as written. Recommend HHOT to maximize pt safety and functional independence in ADL management.    Recommendations for follow up therapy are one component of a multi-disciplinary discharge planning process, led by the attending physician.  Recommendations may be updated based on patient status, additional functional criteria and insurance authorization.    Follow Up Recommendations  Home health OT;Supervision - Intermittent    Equipment Recommendations  3 in 1 bedside commode;Hospital bed;Other (comment) (RW)    Recommendations  for Other Services      Precautions / Restrictions Precautions Precautions: Fall Restrictions Weight Bearing Restrictions: No Other Position/Activity Restrictions: Colostomy bag RLQ and port RUQ       Mobility Bed Mobility Overal bed mobility: Needs Assistance Bed Mobility: Rolling;Supine to Sit;Sit to Supine     Supine to sit: Supervision;HOB elevated Sit to supine: Supervision;HOB elevated   General bed mobility comments: Increased time/effort to perform. Pt requires no physical assist for bed mobility this date.    Transfers Overall transfer level: Needs assistance Equipment used: Rolling walker (2 wheeled) Transfers: Sit to/from [facility] Sit to Stand: Min guard;Supervision Stand pivot transfers: Supervision       General transfer comment: Pt performs functional mobility with initial min guard, but is able to progress from supervision for safety t/o session. She is able to STS from [facility], & bed in lowest position. She demonstrates fair recall of transfer education from past therapy sessions.    Balance Overall balance assessment: Needs assistance Sitting-balance support: Feet supported;No upper extremity supported Sitting balance-Leahy Scale: Good Sitting balance - Comments: Steady static sitting, reaching within BOS.     Standing balance-Leahy Scale: Fair Standing balance comment: Min to mod lean on the RW for support                           ADL either performed or assessed with clinical judgement   ADL Overall ADL's : Needs assistance/impaired     Grooming: Wash/dry hands;Standing;Cueing for safety;Cueing for compensatory techniques Grooming Details (indicate cue type and reason): Pt performs standing handwashing at sink with supervision for safety, min cueing provided t/o task.                 Toilet  Transfer: Regular Toilet;BSC;RW;Set up;Supervision/safety;Ambulation Toilet Transfer Details (indicate cue type and  reason): Pt ambulates to room commode with BSC placed over reg height toilet. She is able to perform toileting with peri-care from STS with supervision for safety & min cueing for safety during session. Toileting- Clothing Manipulation and Hygiene: Set up;Supervision/safety       Functional mobility during ADLs: Supervision/safety;Set up;Rolling walker;Cueing for safety General ADL Comments: Increased time/effort to perform functional activities.     Vision Baseline Vision/History: 1 Wears glasses Patient Visual Report: No change from baseline     Perception     Praxis      Cognition Arousal/Alertness: Awake/alert Behavior During Therapy: WFL for tasks assessed/performed Overall Cognitive Status: Within Functional Limits for tasks assessed                                 General Comments: Generally pleasant, eager to participate with therapy, intermittently tearful and worried about upcoming DC.        Exercises Other Exercises Other Exercises: OT facilitates bed/functional mobility, toilet transfer, toileting, & standing grooming tasks with education on safety, falls prevention, and compensatory strategies provided t/o session.   Shoulder Instructions       General Comments      Pertinent Vitals/ Pain       Pain Assessment: 0-10 Pain Score: 8  Pain Location: Surgical area on bottom and BLEs Pain Descriptors / Indicators: Discomfort;Numbness;Heaviness;Sore Pain Intervention(s): Limited activity within patient's tolerance;Monitored during session;Repositioned;Premedicated before session  Home Living                                          Prior Functioning/Environment              Frequency  Min 1X/week        Progress Toward Goals  OT Goals(current goals can now be found in the care plan section)  Progress towards OT goals: Progressing toward goals  Acute Rehab OT Goals Patient Stated Goal: to decrease pain OT Goal  Formulation: With patient Time For Goal Achievement: 11/08/20 Potential to Achieve Goals: Good  Plan Frequency remains appropriate;Discharge plan remains appropriate    Co-evaluation                 AM-PAC OT "6 Clicks" Daily Activity     Outcome Measure   Help from another person eating meals?: None Help from another person taking care of personal grooming?: A Little Help from another person toileting, which includes using toliet, bedpan, or urinal?: A Little Help from another person bathing (including washing, rinsing, drying)?: A Little Help from another person to put on and taking off regular upper body clothing?: A Little Help from another person to put on and taking off regular lower body clothing?: A Lot 6 Click Score: 18    End of Session Equipment Utilized During Treatment: Rolling walker  OT Visit Diagnosis: Unsteadiness on feet (R26.81);Muscle weakness (generalized) (M62.81);Other abnormalities of gait and mobility (R26.89);Pain   Activity Tolerance Patient tolerated treatment well;Patient limited by pain   Patient Left in bed;with call bell/phone within reach;with bed alarm set   Nurse Communication Mobility status        Time: 1425-1511 OT Time Calculation (min): 46 min  Charges: OT General Charges $OT Visit: 1 Visit OT Treatments $Self Care/Home Management : 38-52  mins  Shara Blazing, M.S., OTR/L Ascom: [phone] 10/28/20, 4:07 PM

## 2020-10-28 NOTE — Progress Notes (Addendum)
Progress Note    Tanya Osborne  OMV:[id number] DOB: January 19, 1960  DOA: 10/21/2020 PCP: Annice Needy, MD      Brief Narrative:    Medical records reviewed and are as summarized below:  Tanya Osborne is a 60 y.o. female with medical history significant of rheumatoid arthritis on prednisone chronically, hypertension, hyperlipidemia, GERD, primary sclerosing cholangitis, CKD-IV, anemia, perforated duodenal ulcer, autoimmune liver disease, small bowel obstruction, colon cancer (s/p colectomy and end ileostomy at [facility] in February of 2020, on chemotherapy and recent dose was 2 weeks prior to admission).  She presented to the hospital because of pain in the left buttock.  She was found to have severe sepsis secondary to left perirectal/gluteal abscess.  She was treated with empiric IV antibiotics, IV fluids and analgesics.  She underwent I&D of left perirectal abscess.  Wound culture showed actinomyces species and Bacteroides fragilis.  ID was consulted to assist with management.  14-day course of doxycycline and Flagyl was recommended at discharge.    Assessment/Plan:   Principal Problem:   Abscess, gluteal, left Active Problems:   RA (rheumatoid arthritis) (HCC)   Essential hypertension   History of colon cancer   Iron deficiency anemia   GERD (gastroesophageal reflux disease)   CKD (chronic kidney disease), stage IIIb   Sepsis (HCC)   Malnutrition of moderate degree   Nutrition Problem: Moderate Malnutrition (in the context of chronic illness) Etiology: cancer and cancer related treatments  Signs/Symptoms:  (mild/moderate muscle and fat depletions and severe weight loss of 14.2% x 6 months)   Body mass index is 25.83 kg/m.  Severe sepsis secondary to left perirectal/gluteal abscess, severe leukocytosis, immunocompromised state: WBC is trending down.  Wound culture showed moderate actinomyces species and abundant Bacteroides fragilis.  ID recommended continuing IV  ceftriaxone and Flagyl and switching to doxycycline and Flagyl for 14 days at discharge.   Recurrent hypoglycemia: Asymptomatic. Encourage adequate oral intake.  Continue nutritional supplements   CKD stage IV: Creatinine is stable.  Rheumatoid arthritis, autoimmune liver disease, primary sclerosing cholangitis: Continue low-dose prednisone  Probable peripheral neuropathy: Analgesics as needed  Stage III colon cancer (February 2020) s/p 12 cycles of FOLFOX chemotherapy.  She received chemotherapy about 2 weeks prior to admission. 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  Plan discussed with [name] over the phone.  She was informed that patient will be discharged home tomorrow.  Patient has also been made aware of discharge plan.  Diet Order             Diet regular Room service appropriate? Yes; Fluid consistency: Thin  Diet effective now                      Consultants: General surgeon  Procedures: I&D of left perirectal abscess    Medications:    Chlorhexidine Gluconate Cloth  6 each Topical Q0600   cholecalciferol  2,000 Units Oral Daily   multivitamin with minerals  1 tablet Oral Daily   omega-3 acid ethyl esters  1 capsule Oral Daily   pantoprazole  20 mg Oral BID   predniSONE  5 mg Oral Daily   Ensure Max Protein  11 oz Oral BID   Continuous Infusions:  cefTRIAXone (ROCEPHIN)  IV 1 g (10/28/20 1150)   lactated ringers Stopped (10/21/20 2101)   metronidazole 500 mg (10/28/20 0600)     Anti-infectives (From admission, onward)    Start  Dose/Rate Route Frequency Ordered Stop   10/27/20 1200  cefTRIAXone (ROCEPHIN) 1 g in sodium chloride 0.9 % 100 mL IVPB        1 g 200 mL/hr over 30 Minutes Intravenous Every 24 hours 10/26/20 1621     10/26/20 1800  metroNIDAZOLE (FLAGYL) IVPB 500 mg        500 mg 100 mL/hr over 60 Minutes Intravenous Every 12 hours 10/26/20 1621     10/22/20 1600   vancomycin (VANCOREADY) IVPB 750 mg/150 mL  Status:  Discontinued        750 mg 150 mL/hr over 60 Minutes Intravenous Every 24 hours 10/21/20 1701 10/22/20 1055   10/22/20 1400  cefTRIAXone (ROCEPHIN) 1 g in sodium chloride 0.9 % 100 mL IVPB  Status:  Discontinued        1 g 200 mL/hr over 30 Minutes Intravenous Every 24 hours 10/21/20 1652 10/26/20 1516   10/21/20 1700  metroNIDAZOLE (FLAGYL) IVPB 500 mg  Status:  Discontinued        500 mg 100 mL/hr over 60 Minutes Intravenous Every 12 hours 10/21/20 1652 10/26/20 1516   10/21/20 1500  vancomycin (VANCOREADY) IVPB 1500 mg/300 mL        1,500 mg 150 mL/hr over 120 Minutes Intravenous  Once 10/21/20 1442 10/21/20 2103   10/21/20 1445  cefTRIAXone (ROCEPHIN) 1 g in sodium chloride 0.9 % 100 mL IVPB        1 g 200 mL/hr over 30 Minutes Intravenous  Once 10/21/20 1442 10/21/20 1612              Family Communication/Anticipated D/C date and plan/Code Status   DVT prophylaxis: SCDs Start: 10/21/20 2036     Code Status: Full Code  Family Communication: Plan was discussed with [name] and Latoya (daughters) over speaker phone   Status is: Inpatient  Remains inpatient appropriate because:IV treatments appropriate due to intensity of illness or inability to take PO  Dispo: The patient is from: Home              Anticipated d/c is to: Home              Patient currently is not medically stable to d/c.   Difficult to place patient No           Subjective:   Interval events noted.  She still complains of pain in the left buttock and neuropathic pain in the hands and feet.  Objective:    Vitals:   10/28/20 0626 10/28/20 0906 10/28/20 1120 10/28/20 1256  BP: (!) 148/58 128/67  133/83  Pulse: 76 73  79  Resp: 18 14  18   Temp: 98.4 F (36.9 C) 98.2 F (36.8 C)  98.4 F (36.9 C)  TempSrc: Oral Oral  Oral  SpO2:  100%  100%  Weight:   74.8 kg   Height:       No data found.   Intake/Output Summary (Last 24  hours) at 10/28/2020 1415 Last data filed at 10/28/2020 1259 Gross per 24 hour  Intake 305.2 ml  Output 1350 ml  Net -1044.8 ml   Filed Weights   10/21/20 1044 10/21/20 1945 10/28/20 1120  Weight: 65.8 kg 72.6 kg 74.8 kg    Exam:  GEN: NAD SKIN: Warm and dry.  Dressing on left buttock surgical wound is intact EYES: No pallor or icterus ENT: MMM CV: RRR PULM: CTA B ABD: soft, ND, NT, +BS CNS: AAO x 3, non focal EXT: No  edema or tenderness                 Data Reviewed:   I have personally reviewed following labs and imaging studies:  Labs: Labs show the following:   Basic Metabolic Panel: Recent Labs  Lab 10/21/20 2029 10/22/20 0423 10/23/20 0606 10/24/20 0600 10/25/20 0600 10/28/20 0510  NA 137 138 139 139 140 140  K 3.8 3.9 3.8 3.5 3.8 4.2  CL 109 110 110 111 113* 111  CO2 17* 18* 21* 21* 21* 23  GLUCOSE 130* 70 81 100* 80 123*  BUN 17 16 23  26* 29* 31*  CREATININE 1.50* 1.54* 1.89* 1.95* 1.85* 1.62*  CALCIUM 8.1* 8.0* 8.5* 8.4* 8.4* 8.9  MG 1.4* 2.1  --   --   --   --    GFR Estimated Creatinine Clearance: 38 mL/min (A) (by C-G formula based on SCr of 1.62 mg/dL (H)). Liver Function Tests: No results for input(s): AST, ALT, ALKPHOS, BILITOT, PROT, ALBUMIN in the last 168 hours. No results for input(s): LIPASE, AMYLASE in the last 168 hours. No results for input(s): AMMONIA in the last 168 hours. Coagulation profile Recent Labs  Lab 10/21/20 1430  INR 1.2    CBC: Recent Labs  Lab 10/24/20 0600 10/25/20 0600 10/26/20 0500 10/27/20 0828 10/28/20 0510  WBC 23.5* 19.3* 17.1* 19.6* 16.7*  NEUTROABS 21.0* 15.8* 12.2* 12.4* 11.5*  HGB 8.8* 8.8* 9.1* 9.5* 9.1*  HCT 27.1* 26.5* 27.8* 28.6* 27.5*  MCV 92.2 93.0 91.4 93.2 93.2  PLT 150 151 176 207 199   Cardiac Enzymes: No results for input(s): CKTOTAL, CKMB, CKMBINDEX, TROPONINI in the last 168 hours. BNP (last 3 results) No results for input(s): PROBNP in the last 8760  hours. CBG: Recent Labs  Lab 10/25/20 0913 10/26/20 0841 10/26/20 0955 10/27/20 0807 10/27/20 0935  GLUCAP 74 65* 88 66* 98   D-Dimer: No results for input(s): DDIMER in the last 72 hours. Hgb A1c: No results for input(s): HGBA1C in the last 72 hours. Lipid Profile: No results for input(s): CHOL, HDL, LDLCALC, TRIG, CHOLHDL, LDLDIRECT in the last 72 hours. Thyroid function studies: No results for input(s): TSH, T4TOTAL, T3FREE, THYROIDAB in the last 72 hours.  Invalid input(s): FREET3 Anemia work up: No results for input(s): VITAMINB12, FOLATE, FERRITIN, TIBC, IRON, RETICCTPCT in the last 72 hours. Sepsis Labs: Recent Labs  Lab 10/21/20 1430 10/21/20 1635 10/21/20 2029 10/21/20 2336 10/22/20 0423 10/23/20 0606 10/24/20 0600 10/25/20 0600 10/26/20 0500 10/27/20 0828 10/28/20 0510  PROCALCITON 3.10  --   --   --  8.58 10.74  --   --   --   --   --   WBC 31.5*  --  26.6*  --  28.6* 29.9*   < > 19.3* 17.1* 19.6* 16.7*  LATICACIDVEN 1.2 3.7* 5.4* 1.6  --   --   --   --   --   --   --    < > = values in this interval not displayed.    Microbiology Recent Results (from the past 240 hour(s))  Blood culture (single)     Status: None   Collection Time: 10/21/20  2:43 PM   Specimen: BLOOD  Result Value Ref Range Status   Specimen Description BLOOD PORTA CATH  Final   Special Requests   Final    BOTTLES DRAWN AEROBIC AND ANAEROBIC Blood Culture adequate volume   Culture   Final    NO GROWTH 5 DAYS Performed at [facility]  [facility], [address] [place], [place] [address]    Report Status 10/26/2020 FINAL  Final  Resp Panel by RT-PCR (Flu A&B, Covid) Nasopharyngeal Swab     Status: None   Collection Time: 10/21/20  4:27 PM   Specimen: Nasopharyngeal Swab; Nasopharyngeal(NP) swabs in vial transport medium  Result Value Ref Range Status   SARS Coronavirus 2 by RT PCR NEGATIVE NEGATIVE Final    Comment: (NOTE) SARS-CoV-2 target nucleic acids are NOT  DETECTED.  The SARS-CoV-2 RNA is generally detectable in upper respiratory specimens during the acute phase of infection. The lowest concentration of SARS-CoV-2 viral copies this assay can detect is 138 copies/mL. A negative result does not preclude SARS-Cov-2 infection and should not be used as the sole basis for treatment or other patient management decisions. A negative result may occur with  improper specimen collection/handling, submission of specimen other than nasopharyngeal swab, presence of viral mutation(s) within the areas targeted by this assay, and inadequate number of viral copies(<138 copies/mL). A negative result must be combined with clinical observations, patient history, and epidemiological information. The expected result is Negative.  Fact Sheet for Patients:  EntrepreneurPulse.com.au  Fact Sheet for Healthcare Providers:  IncredibleEmployment.be  This test is no t yet approved or cleared by the [place] FDA and  has been authorized for detection and/or diagnosis of SARS-CoV-2 by FDA under an Emergency Use Authorization (EUA). This EUA will remain  in effect (meaning this test can be used) for the duration of the COVID-19 declaration under Section 564(b)(1) of the Act, 21 U.S.C.section 360bbb-3(b)(1), unless the authorization is terminated  or revoked sooner.       Influenza A by PCR NEGATIVE NEGATIVE Final   Influenza B by PCR NEGATIVE NEGATIVE Final    Comment: (NOTE) The Xpert Xpress SARS-CoV-2/FLU/RSV plus assay is intended as an aid in the diagnosis of influenza from Nasopharyngeal swab specimens and should not be used as a sole basis for treatment. Nasal washings and aspirates are unacceptable for Xpert Xpress SARS-CoV-2/FLU/RSV testing.  Fact Sheet for Patients: EntrepreneurPulse.com.au  Fact Sheet for Healthcare Providers: IncredibleEmployment.be  This test is not yet  approved or cleared by the [place] FDA and has been authorized for detection and/or diagnosis of SARS-CoV-2 by FDA under an Emergency Use Authorization (EUA). This EUA will remain in effect (meaning this test can be used) for the duration of the COVID-19 declaration under Section 564(b)(1) of the Act, 21 U.S.C. section 360bbb-3(b)(1), unless the authorization is terminated or revoked.  Performed at [facility], [address], [place], [place] [address]   Aerobic/Anaerobic Culture w Gram Stain (surgical/deep wound)     Status: None   Collection Time: 10/21/20  6:40 PM   Specimen: PATH Other; Tissue  Result Value Ref Range Status   Specimen Description   Final    ABSCESS Performed at [facility], [address] [place], [place] [address]    Special Requests PERIRECTAL  Final   Gram Stain   Final    NO SQUAMOUS EPITHELIAL CELLS SEEN FEW WBC SEEN FEW GRAM NEGATIVE RODS MODERATE GRAM POSITIVE COCCI    Culture   Final    MODERATE ACTINOMYCES SPECIES Standardized susceptibility testing for this organism is not available. ABUNDANT BACTEROIDES FRAGILIS BETA LACTAMASE POSITIVE Performed at [facility], [place] [address] [place], [place] [address]    Report Status 10/25/2020 FINAL  Final  MRSA Next Gen by PCR, Nasal     Status: None  Collection Time: 10/21/20  7:46 PM   Specimen: Nasal Mucosa; Nasal Swab  Result Value Ref Range Status   MRSA by PCR Next Gen NOT DETECTED NOT DETECTED Final    Comment: (NOTE) The GeneXpert MRSA Assay (FDA approved for NASAL specimens only), is one component of a comprehensive MRSA colonization surveillance program. It is not intended to diagnose MRSA infection nor to guide or monitor treatment for MRSA infections. Test performance is not FDA approved in patients less than 49 years old. Performed at [facility], [address] [place], [place] [address]     Procedures and diagnostic studies:  No  results found.             LOS: 7 days   Benard Minturn  [facility]   Pager on [email or web address]. If 7PM-7AM, please contact night-coverage at [email or web address]     10/28/2020, 2:15 PM

## 2020-10-28 NOTE — TOC Progression Note (Signed)
Transition of Care [facility]) - Progression Note    Patient Details  Name: Tanya Osborne MRN: [id number] Date of Birth: Nov 17, 1960  Transition of Care [facility] CM/SW Contact  Shelbie Hutching, RN Phone Number: 10/28/2020, 10:48 AM  Clinical Narrative:    RNCM spoke with patient's daughter Lesleigh Noe via phone this morning.  Hospital bed and 3 in 1 were delivered to the home yesterday around 7pm.  [name] reports that they did not have any rollators in stock.  Rhonda with [employer] in the hospital has rollators in stock here and will deliver one to the patient's room.     Expected Discharge Plan: [place] Barriers to Discharge: Continued Medical Work up  Expected Discharge Plan and Services Expected Discharge Plan: [place]   Discharge Planning Services: CM Consult Post Acute Care Choice: Trail arrangements for the past 2 months: Single Family Home                 DME Arranged: Hospital bed, 3-N-1, Walker rolling with seat DME Agency: [employer] Date DME Agency Contacted: 10/24/20 Time DME Agency Contacted: 1506 Representative spoke with at DME Agency: Suanne Marker HH Arranged: RN, PT, OT [facility] Agency: [place] ([place]) Date [place]: 10/24/20 Time [place]: 1252 Representative spoke with at [place]: [place] ([place]) Interventions    Readmission Risk Interventions No flowsheet data found.

## 2020-10-29 LAB — CBC WITH DIFFERENTIAL/PLATELET
Abs Immature Granulocytes: 2.21 10*3/uL — ABNORMAL HIGH (ref 0.00–0.07)
Basophils Absolute: 0.1 10*3/uL (ref 0.0–0.1)
Basophils Relative: 1 %
Eosinophils Absolute: 0.1 10*3/uL (ref 0.0–0.5)
Eosinophils Relative: 1 %
HCT: 26.7 % — ABNORMAL LOW (ref 36.0–46.0)
Hemoglobin: 8.9 g/dL — ABNORMAL LOW (ref 12.0–15.0)
Immature Granulocytes: 14 %
Lymphocytes Relative: 12 %
Lymphs Abs: 1.9 10*3/uL (ref 0.7–4.0)
MCH: 31.3 pg (ref 26.0–34.0)
MCHC: 33.3 g/dL (ref 30.0–36.0)
MCV: 94 fL (ref 80.0–100.0)
Monocytes Absolute: 1.5 10*3/uL — ABNORMAL HIGH (ref 0.1–1.0)
Monocytes Relative: 9 %
Neutro Abs: 10.6 10*3/uL — ABNORMAL HIGH (ref 1.7–7.7)
Neutrophils Relative %: 63 %
Platelets: 212 10*3/uL (ref 150–400)
RBC: 2.84 MIL/uL — ABNORMAL LOW (ref 3.87–5.11)
RDW: 18.5 % — ABNORMAL HIGH (ref 11.5–15.5)
Smear Review: NORMAL
WBC: 16.4 10*3/uL — ABNORMAL HIGH (ref 4.0–10.5)
nRBC: 0.2 % (ref 0.0–0.2)

## 2020-10-29 MED ORDER — HEPARIN SOD (PORK) LOCK FLUSH 100 UNIT/ML IV SOLN
500.0000 [IU] | Freq: Once | INTRAVENOUS | Status: AC
  Administered 2020-10-29: 500 [IU] via INTRAVENOUS
  Filled 2020-10-29: qty 5

## 2020-10-29 MED ORDER — HYDROCODONE-ACETAMINOPHEN 5-325 MG PO TABS: 1.0000 | ORAL_TABLET | ORAL | 0 refills | Status: DC | PRN

## 2020-10-29 MED ORDER — DOXYCYCLINE HYCLATE 100 MG PO TABS: 100.0000 mg | ORAL_TABLET | Freq: Two times a day (BID) | ORAL | 0 refills | Status: AC

## 2020-10-29 MED ORDER — METRONIDAZOLE 500 MG PO TABS: 500.0000 mg | ORAL_TABLET | Freq: Two times a day (BID) | ORAL | 0 refills | Status: AC

## 2020-10-29 MED ORDER — ENSURE MAX PROTEIN PO LIQD: 11.0000 [oz_av] | Freq: Two times a day (BID) | ORAL | Status: DC

## 2020-10-29 NOTE — Progress Notes (Signed)
Pt discharged.  Property given to pt and daughter.  Port heparin locked and deaccessed.

## 2020-10-29 NOTE — Discharge Summary (Signed)
Physician Discharge Summary  Tanya Osborne KWI:[id number] DOB: Jul 14, 1960 DOA: 10/21/2020  PCP: Annice Needy, MD  Admit date: 10/21/2020 Discharge date: 10/29/2020  Admitted From: home Disposition:  home  Recommendations for Outpatient Follow-up:  Follow up with PCP in 1-2 weeks Please obtain BMP/CBC in one week Please follow up with infectious disease in 2 weeks Follow up on healing of gluteal cleft abscess and wound care  Home Health: PT, OT, RN  Equipment/Devices: 3-n-1, walker, hospital bed   Discharge Condition: stable  CODE STATUS: full  Diet recommendation: Regular     Discharge Diagnoses: Principal Problem:   Abscess, gluteal, left Active Problems:   RA (rheumatoid arthritis) ([place])   Essential hypertension   History of colon cancer   Iron deficiency anemia   GERD (gastroesophageal reflux disease)   CKD (chronic kidney disease), stage IIIb   Sepsis ([place])   Malnutrition of moderate degree    Summary of HPI and Hospital Course:  Per Dr. Mal Misty:  "Tanya Osborne is a 60 y.o. female with medical history significant of rheumatoid arthritis on prednisone chronically, hypertension, hyperlipidemia, GERD, primary sclerosing cholangitis, CKD-IV, anemia, perforated duodenal ulcer, autoimmune liver disease, small bowel obstruction, colon cancer (s/p colectomy and end ileostomy at [facility] in February of 2020, on chemotherapy and recent dose was 2 weeks prior to admission).  She presented to the hospital because of pain in the left buttock.   She was found to have severe sepsis secondary to left perirectal/gluteal abscess.  She was treated with empiric IV antibiotics, IV fluids and analgesics.  She underwent I&D of left perirectal abscess.  Wound culture showed actinomyces species and Bacteroides fragilis.  ID was consulted to assist with management.  14-day course of doxycycline and Flagyl was recommended at discharge."   Discharge Plan: 14 days Flagyl and Doxycycline, as  recommended by ID. Follow up in ID clinic in two weeks. Norco as needed for pain control.     Severe sepsis secondary to left perirectal/gluteal abscess Severe leukocytosis - WBC trending downward. Immunocompromised state:  Wound culture grew moderate actinomyces species and abundant Bacteroides fragilis.   Infectious Disease recommended continuing IV ceftriaxone and Flagyl while inpatient, then oral doxycycline and Flagyl for 14 days at discharge.    Recurrent hypoglycemia: Asymptomatic.  Encourage adequate oral intake.   Continue nutritional supplements  Nutrition Problem: Moderate Malnutrition (in the context of chronic illness) Etiology: cancer and cancer related treatments Signs/Symptoms:  (mild/moderate muscle and fat depletions and severe weight loss of 14.2% x 6 months) Body mass index is 25.83 kg/m.   CKD stage IV: renal function stable.  Monitor BMP in follow up.  Rheumatoid arthritis, autoimmune liver disease, primary sclerosing cholangitis: these are stable without acute issues.  Continue low-dose prednisone   Probable peripheral neuropathy: suspect due to effects of chemotherapy.  Analgesics as needed.   Stage III colon cancer (February 2020) s/p 12 cycles of FOLFOX chemotherapy.   She received chemotherapy about 2 weeks prior to admission. 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       Discharge Instructions   Discharge Instructions     Call MD for:   Complete by: As directed    Increased pain, swelling, redness or purulent (cloudy) drainage from abscess site.   Call MD for:  extreme fatigue   Complete by: As directed    Call MD for:  persistant dizziness or light-headedness   Complete by: As directed    Call  MD for:  persistant nausea and vomiting   Complete by: As directed    Call MD for:  severe uncontrolled pain   Complete by: As directed    Call MD for:  temperature >100.4    Complete by: As directed    Diet - low sodium heart healthy   Complete by: As directed    Discharge instructions   Complete by: As directed    Please take both antibiotics (doxycycline and metronidazole) TWICE DAILY for 14 days.  Dr. Ola Spurr (infectious disease specialist) will see you in clinic to follow up and check your abscess in 2 weeks. His office should contact you to schedule that appointment.   Discharge wound care:   Complete by: As directed    Pack left perirectal I&D site with vaginal packing vs Kerlix; cover, secure. Change as needed   Face-to-face encounter (required for Medicare/Medicaid patients)   Complete by: As directed    I BERNARD AYIKU certify that this patient is under my care and that I, or a nurse practitioner or physician's assistant working with me, had a face-to-face encounter that meets the physician face-to-face encounter requirements with this patient on 10/28/2020. The encounter with the patient was in whole, or in part for the following medical condition(s) which is the primary reason for home health care (List medical condition): Debility   The encounter with the patient was in whole, or in part, for the following medical condition, which is the primary reason for home health care: Debility   I certify that, based on my findings, the following services are medically necessary home health services: Physical therapy   Reason for Medically Necessary Home Health Services: Therapy- Personnel officer, Public librarian   My clinical findings support the need for the above services: Unsafe ambulation due to balance issues   Further, I certify that my clinical findings support that this patient is homebound due to: Unsafe ambulation due to balance issues   Home Health   Complete by: As directed    To provide the following care/treatments:  PT OT RN Social work     Increase activity slowly   Complete by: As directed       Allergies as of  10/29/2020       Reactions   Nsaids Swelling, Rash   Other Reaction: OTHER REACTION-GI BLEED   Penicillins Swelling   Percocet [oxycodone-acetaminophen] Nausea And Vomiting   Celebrex [celecoxib] Swelling   Cortisone Swelling   Gabapentin Swelling   Methotrexate Derivatives Swelling   Nabumetone Other (See Comments)   Tomato Rash   Joint inflamation        Medication List     STOP taking these medications    ferrous sulfate 325 (65 FE) MG tablet       TAKE these medications    Cholecalciferol 50 MCG (2000 UT) Caps Take 2,000 Units by mouth daily.   clobetasol ointment 0.05 % Commonly known as: TEMOVATE Apply 1 application topically 2 (two) times daily.   Cod Liver Oil 5000-500 UNIT/5ML Oil Take 1 capsule by mouth daily.   dexamethasone 4 MG tablet Commonly known as: DECADRON Take 4-8 mg by mouth daily.   doxycycline 100 MG tablet Commonly known as: VIBRA-TABS Take 1 tablet (100 mg total) by mouth 2 (two) times daily for 14 days.   Ensure Max Protein Liqd Take 330 mLs (11 oz total) by mouth 2 (two) times daily.   HYDROcodone-acetaminophen 5-325 MG tablet Commonly known as: NORCO/VICODIN Take  1-2 tablets by mouth every 4 (four) hours as needed for moderate pain or severe pain.   lidocaine-prilocaine cream Commonly known as: EMLA Apply 1 application topically once.   metroNIDAZOLE 500 MG tablet Commonly known as: Flagyl Take 1 tablet (500 mg total) by mouth 2 (two) times daily for 14 days.   Multi-Vitamin tablet Take 1 tablet by mouth daily.   pantoprazole 20 MG tablet Commonly known as: PROTONIX Take 20 mg by mouth daily.   predniSONE 5 MG tablet Commonly known as: DELTASONE Take 5 mg by mouth daily.               Durable Medical Equipment  (From admission, onward)           Start     Ordered   10/24/20 1523  For home use only DME Hospital bed  Once       Question Answer Comment  Length of Need 6 Months   Patient has (list  medical condition): Gluteal abcess left buttock open wound with packing   The above medical condition requires: Patient requires the ability to reposition frequently   Head must be elevated greater than: 30 degrees   Bed type Semi-electric   Support Surface: Gel Overlay      10/24/20 1526   10/24/20 1522  For home use only DME 3 n 1  Once        10/24/20 1526   10/24/20 1522  For home use only DME 4 wheeled rolling walker with seat  Once       Question:  Patient needs a walker to treat with the following condition  Answer:  Weakness   10/24/20 1526              Discharge Care Instructions  (From admission, onward)           Start     Ordered   10/29/20 0000  Discharge wound care:       Comments: Pack left perirectal I&D site with vaginal packing vs Kerlix; cover, secure. Change as needed   10/29/20 1100            Follow-up Information     Leonel Ramsay, MD. Schedule an appointment as soon as possible for a visit in 2 week(s).   Specialty: Infectious Diseases Why: office will call with appointment Contact information: [place] [address] [phone]         Annice Needy, MD. Schedule an appointment as soon as possible for a visit in 1 week(s).   Specialty: Internal Medicine Why: Hospital follow up Contact information: [address] [place]  [address] [phone]                Allergies  Allergen Reactions   Nsaids Swelling and Rash    Other Reaction: OTHER REACTION-GI BLEED   Penicillins Swelling   Percocet [Oxycodone-Acetaminophen] Nausea And Vomiting   Celebrex [Celecoxib] Swelling   Cortisone Swelling   Gabapentin Swelling   Methotrexate Derivatives Swelling   Nabumetone Other (See Comments)   Tomato Rash    Joint inflamation     If you experience worsening of your admission symptoms, develop shortness of breath, life threatening emergency, suicidal or homicidal thoughts you must  seek medical attention immediately by calling 911 or calling your MD immediately  if symptoms less severe.    Please note   You were cared for by a hospitalist during your hospital stay. If you have any  questions about your discharge medications or the care you received while you were in the hospital after you are discharged, you can call the unit and asked to speak with the hospitalist on call if the hospitalist that took care of you is not available. Once you are discharged, your primary care physician will handle any further medical issues. Please note that NO REFILLS for any discharge medications will be authorized once you are discharged, as it is imperative that you return to your primary care physician (or establish a relationship with a primary care physician if you do not have one) for your aftercare needs so that they can reassess your need for medications and monitor your lab values.   Consultations: General surgery Infectious disease    Procedures/Studies: CT PELVIS W CONTRAST  Result Date: 10/21/2020 CLINICAL DATA:  Evaluate abscess versus cellulitis along medial aspect of left gluteal fold EXAM: CT PELVIS WITH CONTRAST TECHNIQUE: Multidetector CT imaging of the pelvis was performed using the standard protocol following the bolus administration of intravenous contrast. CONTRAST:  7mL OMNIPAQUE IOHEXOL 350 MG/ML SOLN COMPARISON:  CT abdomen/pelvis 04/29/2020 FINDINGS: Urinary Tract:  No abnormality visualized. Bowel: Postsurgical changes reflecting Hartmann's pouch and right lower quadrant and right lower quadrant ileostomy are seen. There is no abnormal bowel wall thickening or inflammatory change to the level imaged. Vascular/Lymphatic: There is mild calcified atherosclerotic plaque in the imaged abdominal aorta. The imaged venous structures appear patent. There is no lymphadenopathy in the pelvis. Reproductive: The right ovary is asymmetrically enlarged measuring 3.1 cm x 3.5 cm.  There is a tubular hypodense structure in the right adnexa suspicious for hydrosalpinx. These findings are new since 04/29/2020. A calcified anterior uterine fibroid is again seen. There is no left adnexal mass. Other: Soft tissue nodularity is again seen along the left aspect of the omentum and mesenteric fat, incompletely imaged but also seen on the study from 04/29/2020. There is a 3.1 cm AP x 2.6 cm TV by 3.7 cm cc peripherally enhancing collection in the subcutaneous fat along the left aspect of the gluteal cleft. There is inflammatory change in the surrounding fat and overlying skin thickening. Musculoskeletal: There is no acute osseous abnormality or aggressive osseous lesion. IMPRESSION: 1. 3.1 cm x 2.6 cm x 3.7 cm peripherally enhancing collection in the subcutaneous fat along the left aspect of the gluteal cleft consistent with abscess. There is surrounding inflammatory change and overlying skin thickening consistent with concomitant cellulitis. 2. Thickening and nodularity of the left omentum/mesentery again suspicious for metastatic disease, incompletely imaged but also present on the study from 04/29/2020. 3. Asymmetric enlargement of the right ovary with a tubular structure in the right adnexa suspicious for hydrosalpinx, new since 04/29/2020. Recommend further evaluation with pelvic ultrasound. Electronically Signed   By: Valetta Mole M.D.   On: 10/21/2020 15:59   DG Chest Portable 1 View  Result Date: 10/21/2020 CLINICAL DATA:  Mass in left buttocks causing discomfort and burning sensation. EXAM: PORTABLE CHEST 1 VIEW COMPARISON:  None. FINDINGS: A right-sided venous Port-A-Cath is seen with its distal tip noted approximate 1.4 cm proximal to the junction of the superior vena cava and right atrium. The heart size and mediastinal contours are within normal limits. Both lungs are clear. The visualized skeletal structures are unremarkable. IMPRESSION: No active disease. Electronically Signed   By:  Virgina Norfolk M.D.   On: 10/21/2020 15:12     Abscess I&D   Subjective: Pt apprehensive about going home.  Would prefer to  stay two more days for additional physical therapy here.  She otherwise reports doing well.  No fever/chills, pain in controlled. Requests ostomy bag be changed before it fills up.  No acute complaints. No acute events reported.    Discharge Exam: Vitals:   10/29/20 0631 10/29/20 1135  BP: 140/81 (!) 144/77  Pulse: 76 78  Resp: 16 18  Temp: 97.9 F (36.6 C) (!) 97.5 F (36.4 C)  SpO2: 100% 100%   Vitals:   10/28/20 2035 10/29/20 0251 10/29/20 0631 10/29/20 1135  BP: 133/73  140/81 (!) 144/77  Pulse: 70  76 78  Resp: 20  16 18   Temp: (!) 97.5 F (36.4 C)  97.9 F (36.6 C) (!) 97.5 F (36.4 C)  TempSrc: Oral  Oral Oral  SpO2: 100%  100% 100%  Weight:  71.6 kg    Height:        General: Pt is alert, awake, not in acute distress Cardiovascular: RRR, S1/S2 +, no rubs, no gallops Respiratory: CTA bilaterally, no wheezing, no rhonchi Abdominal: colostomy bag on right side on abdomen with light brown liquid stool, Soft, NT, ND, bowel sounds + Extremities: chronic left ankle edema, normal tone, moves all extremities    The results of significant diagnostics from this hospitalization (including imaging, microbiology, ancillary and laboratory) are listed below for reference.     Microbiology: Recent Results (from the past 240 hour(s))  Blood culture (single)     Status: None   Collection Time: 10/21/20  2:43 PM   Specimen: BLOOD  Result Value Ref Range Status   Specimen Description BLOOD PORTA CATH  Final   Special Requests   Final    BOTTLES DRAWN AEROBIC AND ANAEROBIC Blood Culture adequate volume   Culture   Final    NO GROWTH 5 DAYS Performed at [facility], [address] [place], [place] [address]    Report Status 10/26/2020 FINAL  Final  Resp Panel by RT-PCR (Flu A&B, Covid) Nasopharyngeal Swab     Status: None    Collection Time: 10/21/20  4:27 PM   Specimen: Nasopharyngeal Swab; Nasopharyngeal(NP) swabs in vial transport medium  Result Value Ref Range Status   SARS Coronavirus 2 by RT PCR NEGATIVE NEGATIVE Final    Comment: (NOTE) SARS-CoV-2 target nucleic acids are NOT DETECTED.  The SARS-CoV-2 RNA is generally detectable in upper respiratory specimens during the acute phase of infection. The lowest concentration of SARS-CoV-2 viral copies this assay can detect is 138 copies/mL. A negative result does not preclude SARS-Cov-2 infection and should not be used as the sole basis for treatment or other patient management decisions. A negative result may occur with  improper specimen collection/handling, submission of specimen other than nasopharyngeal swab, presence of viral mutation(s) within the areas targeted by this assay, and inadequate number of viral copies(<138 copies/mL). A negative result must be combined with clinical observations, patient history, and epidemiological information. The expected result is Negative.  Fact Sheet for Patients:  EntrepreneurPulse.com.au  Fact Sheet for Healthcare Providers:  IncredibleEmployment.be  This test is no t yet approved or cleared by the [place] FDA and  has been authorized for detection and/or diagnosis of SARS-CoV-2 by FDA under an Emergency Use Authorization (EUA). This EUA will remain  in effect (meaning this test can be used) for the duration of the COVID-19 declaration under Section 564(b)(1) of the Act, 21 U.S.C.section 360bbb-3(b)(1), unless the authorization is terminated  or revoked sooner.       Influenza  A by PCR NEGATIVE NEGATIVE Final   Influenza B by PCR NEGATIVE NEGATIVE Final    Comment: (NOTE) The Xpert Xpress SARS-CoV-2/FLU/RSV plus assay is intended as an aid in the diagnosis of influenza from Nasopharyngeal swab specimens and should not be used as a sole basis for treatment.  Nasal washings and aspirates are unacceptable for Xpert Xpress SARS-CoV-2/FLU/RSV testing.  Fact Sheet for Patients: EntrepreneurPulse.com.au  Fact Sheet for Healthcare Providers: IncredibleEmployment.be  This test is not yet approved or cleared by the [place] FDA and has been authorized for detection and/or diagnosis of SARS-CoV-2 by FDA under an Emergency Use Authorization (EUA). This EUA will remain in effect (meaning this test can be used) for the duration of the COVID-19 declaration under Section 564(b)(1) of the Act, 21 U.S.C. section 360bbb-3(b)(1), unless the authorization is terminated or revoked.  Performed at [facility], [address] [place], [place] [address]   Aerobic/Anaerobic Culture w Gram Stain (surgical/deep wound)     Status: None   Collection Time: 10/21/20  6:40 PM   Specimen: PATH Other; Tissue  Result Value Ref Range Status   Specimen Description   Final    ABSCESS Performed at [facility], [address], [place], [place] [address]    Special Requests PERIRECTAL  Final   Gram Stain   Final    NO SQUAMOUS EPITHELIAL CELLS SEEN FEW WBC SEEN FEW GRAM NEGATIVE RODS MODERATE GRAM POSITIVE COCCI    Culture   Final    MODERATE ACTINOMYCES SPECIES Standardized susceptibility testing for this organism is not available. ABUNDANT BACTEROIDES FRAGILIS BETA LACTAMASE POSITIVE Performed at [facility], [place] [address] [place], [place] [address]    Report Status 10/25/2020 FINAL  Final  MRSA Next Gen by PCR, Nasal     Status: None   Collection Time: 10/21/20  7:46 PM   Specimen: Nasal Mucosa; Nasal Swab  Result Value Ref Range Status   MRSA by PCR Next Gen NOT DETECTED NOT DETECTED Final    Comment: (NOTE) The GeneXpert MRSA Assay (FDA approved for NASAL specimens only), is one component of a comprehensive MRSA colonization surveillance program. It is not intended to diagnose MRSA  infection nor to guide or monitor treatment for MRSA infections. Test performance is not FDA approved in patients less than 24 years old. Performed at [facility], [address] [place],  [address]      Labs: BNP (last 3 results) Recent Labs    10/21/20 1430  BNP 295.6*   Basic Metabolic Panel: Recent Labs  Lab 10/23/20 0606 10/24/20 0600 10/25/20 0600 10/28/20 0510  NA 139 139 140 140  K 3.8 3.5 3.8 4.2  CL 110 111 113* 111  CO2 21* 21* 21* 23  GLUCOSE 81 100* 80 123*  BUN 23 26* 29* 31*  CREATININE 1.89* 1.95* 1.85* 1.62*  CALCIUM 8.5* 8.4* 8.4* 8.9   Liver Function Tests: No results for input(s): AST, ALT, ALKPHOS, BILITOT, PROT, ALBUMIN in the last 168 hours. No results for input(s): LIPASE, AMYLASE in the last 168 hours. No results for input(s): AMMONIA in the last 168 hours. CBC: Recent Labs  Lab 10/25/20 0600 10/26/20 0500 10/27/20 0828 10/28/20 0510 10/29/20 0630  WBC 19.3* 17.1* 19.6* 16.7* 16.4*  NEUTROABS 15.8* 12.2* 12.4* 11.5* 10.6*  HGB 8.8* 9.1* 9.5* 9.1* 8.9*  HCT 26.5* 27.8* 28.6* 27.5* 26.7*  MCV 93.0 91.4 93.2 93.2 94.0  PLT 151 176 207 199 212   Cardiac Enzymes:  No results for input(s): CKTOTAL, CKMB, CKMBINDEX, TROPONINI in the last 168 hours. BNP: Invalid input(s): POCBNP CBG: Recent Labs  Lab 10/25/20 0913 10/26/20 0841 10/26/20 0955 10/27/20 0807 10/27/20 0935  GLUCAP 74 65* 88 66* 98   D-Dimer No results for input(s): DDIMER in the last 72 hours. Hgb A1c No results for input(s): HGBA1C in the last 72 hours. Lipid Profile No results for input(s): CHOL, HDL, LDLCALC, TRIG, CHOLHDL, LDLDIRECT in the last 72 hours. Thyroid function studies No results for input(s): TSH, T4TOTAL, T3FREE, THYROIDAB in the last 72 hours.  Invalid input(s): FREET3 Anemia work up No results for input(s): VITAMINB12, FOLATE, FERRITIN, TIBC, IRON, RETICCTPCT in the last 72 hours. Urinalysis    Component Value Date/Time    COLORURINE AMBER (A) 10/21/2020 1313   APPEARANCEUR HAZY (A) 10/21/2020 1313   LABSPEC 1.013 10/21/2020 1313   PHURINE 5.0 10/21/2020 1313   GLUCOSEU NEGATIVE 10/21/2020 1313   HGBUR SMALL (A) 10/21/2020 1313   BILIRUBINUR NEGATIVE 10/21/2020 1313   [place] 10/21/2020 1313   PROTEINUR 30 (A) 10/21/2020 1313   NITRITE NEGATIVE 10/21/2020 1313   LEUKOCYTESUR NEGATIVE 10/21/2020 1313   Sepsis Labs Invalid input(s): PROCALCITONIN,  WBC,  LACTICIDVEN Microbiology Recent Results (from the past 240 hour(s))  Blood culture (single)     Status: None   Collection Time: 10/21/20  2:43 PM   Specimen: BLOOD  Result Value Ref Range Status   Specimen Description BLOOD PORTA CATH  Final   Special Requests   Final    BOTTLES DRAWN AEROBIC AND ANAEROBIC Blood Culture adequate volume   Culture   Final    NO GROWTH 5 DAYS Performed at [facility], [address] [place], [place] [address]    Report Status 10/26/2020 FINAL  Final  Resp Panel by RT-PCR (Flu A&B, Covid) Nasopharyngeal Swab     Status: None   Collection Time: 10/21/20  4:27 PM   Specimen: Nasopharyngeal Swab; Nasopharyngeal(NP) swabs in vial transport medium  Result Value Ref Range Status   SARS Coronavirus 2 by RT PCR NEGATIVE NEGATIVE Final    Comment: (NOTE) SARS-CoV-2 target nucleic acids are NOT DETECTED.  The SARS-CoV-2 RNA is generally detectable in upper respiratory specimens during the acute phase of infection. The lowest concentration of SARS-CoV-2 viral copies this assay can detect is 138 copies/mL. A negative result does not preclude SARS-Cov-2 infection and should not be used as the sole basis for treatment or other patient management decisions. A negative result may occur with  improper specimen collection/handling, submission of specimen other than nasopharyngeal swab, presence of viral mutation(s) within the areas targeted by this assay, and inadequate number of viral copies(<138  copies/mL). A negative result must be combined with clinical observations, patient history, and epidemiological information. The expected result is Negative.  Fact Sheet for Patients:  EntrepreneurPulse.com.au  Fact Sheet for Healthcare Providers:  IncredibleEmployment.be  This test is no t yet approved or cleared by the [place] FDA and  has been authorized for detection and/or diagnosis of SARS-CoV-2 by FDA under an Emergency Use Authorization (EUA). This EUA will remain  in effect (meaning this test can be used) for the duration of the COVID-19 declaration under Section 564(b)(1) of the Act, 21 U.S.C.section 360bbb-3(b)(1), unless the authorization is terminated  or revoked sooner.       Influenza A by PCR NEGATIVE NEGATIVE Final   Influenza B by PCR NEGATIVE NEGATIVE Final    Comment: (NOTE) The Xpert Xpress SARS-CoV-2/FLU/RSV plus assay  is intended as an aid in the diagnosis of influenza from Nasopharyngeal swab specimens and should not be used as a sole basis for treatment. Nasal washings and aspirates are unacceptable for Xpert Xpress SARS-CoV-2/FLU/RSV testing.  Fact Sheet for Patients: EntrepreneurPulse.com.au  Fact Sheet for Healthcare Providers: IncredibleEmployment.be  This test is not yet approved or cleared by the [place] FDA and has been authorized for detection and/or diagnosis of SARS-CoV-2 by FDA under an Emergency Use Authorization (EUA). This EUA will remain in effect (meaning this test can be used) for the duration of the COVID-19 declaration under Section 564(b)(1) of the Act, 21 U.S.C. section 360bbb-3(b)(1), unless the authorization is terminated or revoked.  Performed at [facility], [address] [place], [place] [address]   Aerobic/Anaerobic Culture w Gram Stain (surgical/deep wound)     Status: None   Collection Time: 10/21/20  6:40 PM   Specimen:  PATH Other; Tissue  Result Value Ref Range Status   Specimen Description   Final    ABSCESS Performed at [facility], [address], [place], [place] [address]    Special Requests PERIRECTAL  Final   Gram Stain   Final    NO SQUAMOUS EPITHELIAL CELLS SEEN FEW WBC SEEN FEW GRAM NEGATIVE RODS MODERATE GRAM POSITIVE COCCI    Culture   Final    MODERATE ACTINOMYCES SPECIES Standardized susceptibility testing for this organism is not available. ABUNDANT BACTEROIDES FRAGILIS BETA LACTAMASE POSITIVE Performed at [facility], [place] [address] [place], [place] [address]    Report Status 10/25/2020 FINAL  Final  MRSA Next Gen by PCR, Nasal     Status: None   Collection Time: 10/21/20  7:46 PM   Specimen: Nasal Mucosa; Nasal Swab  Result Value Ref Range Status   MRSA by PCR Next Gen NOT DETECTED NOT DETECTED Final    Comment: (NOTE) The GeneXpert MRSA Assay (FDA approved for NASAL specimens only), is one component of a comprehensive MRSA colonization surveillance program. It is not intended to diagnose MRSA infection nor to guide or monitor treatment for MRSA infections. Test performance is not FDA approved in patients less than 60 years old. Performed at [facility], [address] [place], [place] [address]      Time coordinating discharge: Over 30 minutes  SIGNED:   Ezekiel Slocumb, DO [facility] 10/29/2020, 12:53 PM   If 7PM-7AM, please contact night-coverage [email or web address]

## 2020-10-29 NOTE — TOC Transition Note (Signed)
Transition of Care [facility]) - CM/SW Discharge Note   Patient Details  Name: Tanya Osborne MRN: [id number] Date of Birth: May 27, 1960  Transition of Care [facility]) CM/SW Contact:  Shelbie Hutching, RN Phone Number: 10/29/2020, 1:45 PM   Clinical Narrative:    Patient medically cleared for discharge today.  Patient's daughter, Lesleigh Noe, will pick patient up today.  All DME has been delivered.  [facility] health arranged with [facility], Corene Cornea aware of discharge today.  Gait belt is in the patient's room that she can take home with her.  Bedside RN will sent patient home with some dressing supplies.     Final next level of care: [place] Barriers to Discharge: Barriers Resolved   Patient Goals and CMS Choice Patient states their goals for this hospitalization and ongoing recovery are:: Patient wants to go home with home health and get better CMS Medicare.gov Compare Post Acute Care list provided to:: Patient Choice offered to / list presented to : Patient, Adult Children  Discharge Placement                       Discharge Plan and Services   Discharge Planning Services: CM Consult Post Acute Care Choice: Home Health          DME Arranged: Hospital bed, 3-N-1, Walker rolling with seat DME Agency: [employer] Date DME Agency Contacted: 10/24/20 Time DME Agency Contacted: 1506 Representative spoke with at DME Agency: [place]: RN, PT, OT [facility] Agency: [place] ([place]) Date [place]: 10/29/20 Time [place]: 1344 Representative spoke with at [place]: [place] ([place]) Interventions     Readmission Risk Interventions No flowsheet data found.

## 2021-04-01 ENCOUNTER — Inpatient Hospital Stay: Payer: Medicare Other

## 2021-04-01 ENCOUNTER — Emergency Department: Payer: Medicare Other

## 2021-04-01 ENCOUNTER — Other Ambulatory Visit: Payer: Self-pay | Admitting: Radiology

## 2021-04-01 ENCOUNTER — Other Ambulatory Visit: Payer: Self-pay

## 2021-04-01 ENCOUNTER — Inpatient Hospital Stay
Admission: EM | Admit: 2021-04-01 | Discharge: 2021-04-04 | DRG: 871 | Disposition: A | Discharge status: Hospice | Payer: Medicare Other | Attending: Internal Medicine | Admitting: Internal Medicine

## 2021-04-01 DIAGNOSIS — Z9049 Acquired absence of other specified parts of digestive tract: Secondary | ICD-10-CM

## 2021-04-01 DIAGNOSIS — C189 Malignant neoplasm of colon, unspecified: Secondary | ICD-10-CM

## 2021-04-01 DIAGNOSIS — E860 Dehydration: Secondary | ICD-10-CM | POA: Diagnosis present

## 2021-04-01 DIAGNOSIS — K219 Gastro-esophageal reflux disease without esophagitis: Secondary | ICD-10-CM | POA: Diagnosis present

## 2021-04-01 DIAGNOSIS — R7989 Other specified abnormal findings of blood chemistry: Secondary | ICD-10-CM

## 2021-04-01 DIAGNOSIS — R6510 Systemic inflammatory response syndrome (SIRS) of non-infectious origin without acute organ dysfunction: Secondary | ICD-10-CM | POA: Diagnosis present

## 2021-04-01 DIAGNOSIS — E43 Unspecified severe protein-calorie malnutrition: Secondary | ICD-10-CM | POA: Diagnosis present

## 2021-04-01 DIAGNOSIS — M069 Rheumatoid arthritis, unspecified: Secondary | ICD-10-CM | POA: Diagnosis present

## 2021-04-01 DIAGNOSIS — J9601 Acute respiratory failure with hypoxia: Secondary | ICD-10-CM | POA: Diagnosis present

## 2021-04-01 DIAGNOSIS — Z880 Allergy status to penicillin: Secondary | ICD-10-CM | POA: Diagnosis not present

## 2021-04-01 DIAGNOSIS — E871 Hypo-osmolality and hyponatremia: Secondary | ICD-10-CM | POA: Diagnosis present

## 2021-04-01 DIAGNOSIS — Z91018 Allergy to other foods: Secondary | ICD-10-CM

## 2021-04-01 DIAGNOSIS — E7800 Pure hypercholesterolemia, unspecified: Secondary | ICD-10-CM | POA: Diagnosis present

## 2021-04-01 DIAGNOSIS — Z933 Colostomy status: Secondary | ICD-10-CM

## 2021-04-01 DIAGNOSIS — Z85038 Personal history of other malignant neoplasm of large intestine: Secondary | ICD-10-CM | POA: Diagnosis not present

## 2021-04-01 DIAGNOSIS — Z886 Allergy status to analgesic agent status: Secondary | ICD-10-CM

## 2021-04-01 DIAGNOSIS — N1832 Chronic kidney disease, stage 3b: Secondary | ICD-10-CM | POA: Diagnosis present

## 2021-04-01 DIAGNOSIS — K7689 Other specified diseases of liver: Secondary | ICD-10-CM | POA: Diagnosis not present

## 2021-04-01 DIAGNOSIS — N170 Acute kidney failure with tubular necrosis: Secondary | ICD-10-CM | POA: Diagnosis present

## 2021-04-01 DIAGNOSIS — Z7189 Other specified counseling: Secondary | ICD-10-CM | POA: Diagnosis not present

## 2021-04-01 DIAGNOSIS — Z20822 Contact with and (suspected) exposure to covid-19: Secondary | ICD-10-CM | POA: Diagnosis present

## 2021-04-01 DIAGNOSIS — C786 Secondary malignant neoplasm of retroperitoneum and peritoneum: Secondary | ICD-10-CM | POA: Diagnosis present

## 2021-04-01 DIAGNOSIS — K769 Liver disease, unspecified: Secondary | ICD-10-CM | POA: Diagnosis present

## 2021-04-01 DIAGNOSIS — I10 Essential (primary) hypertension: Secondary | ICD-10-CM | POA: Diagnosis not present

## 2021-04-01 DIAGNOSIS — N179 Acute kidney failure, unspecified: Secondary | ICD-10-CM | POA: Diagnosis not present

## 2021-04-01 DIAGNOSIS — E440 Moderate protein-calorie malnutrition: Secondary | ICD-10-CM | POA: Diagnosis not present

## 2021-04-01 DIAGNOSIS — E872 Acidosis, unspecified: Secondary | ICD-10-CM | POA: Diagnosis present

## 2021-04-01 DIAGNOSIS — I129 Hypertensive chronic kidney disease with stage 1 through stage 4 chronic kidney disease, or unspecified chronic kidney disease: Secondary | ICD-10-CM | POA: Diagnosis present

## 2021-04-01 DIAGNOSIS — Z888 Allergy status to other drugs, medicaments and biological substances status: Secondary | ICD-10-CM

## 2021-04-01 DIAGNOSIS — Z6822 Body mass index (BMI) 22.0-22.9, adult: Secondary | ICD-10-CM

## 2021-04-01 DIAGNOSIS — A4159 Other Gram-negative sepsis: Principal | ICD-10-CM | POA: Diagnosis present

## 2021-04-01 DIAGNOSIS — Z79899 Other long term (current) drug therapy: Secondary | ICD-10-CM

## 2021-04-01 DIAGNOSIS — R64 Cachexia: Secondary | ICD-10-CM | POA: Diagnosis present

## 2021-04-01 DIAGNOSIS — D696 Thrombocytopenia, unspecified: Secondary | ICD-10-CM | POA: Diagnosis present

## 2021-04-01 DIAGNOSIS — A419 Sepsis, unspecified organism: Secondary | ICD-10-CM | POA: Diagnosis not present

## 2021-04-01 DIAGNOSIS — R6520 Severe sepsis without septic shock: Secondary | ICD-10-CM | POA: Diagnosis present

## 2021-04-01 DIAGNOSIS — R0602 Shortness of breath: Secondary | ICD-10-CM | POA: Diagnosis present

## 2021-04-01 DIAGNOSIS — Z7952 Long term (current) use of systemic steroids: Secondary | ICD-10-CM

## 2021-04-01 DIAGNOSIS — Z8249 Family history of ischemic heart disease and other diseases of the circulatory system: Secondary | ICD-10-CM

## 2021-04-01 HISTORY — DX: Sepsis, unspecified organism: A41.9

## 2021-04-01 LAB — RESP PANEL BY RT-PCR (FLU A&B, COVID) ARPGX2
Influenza A by PCR: NEGATIVE
Influenza B by PCR: NEGATIVE
SARS Coronavirus 2 by RT PCR: NEGATIVE

## 2021-04-01 LAB — URINALYSIS, COMPLETE (UACMP) WITH MICROSCOPIC
Bacteria, UA: NONE SEEN
Bilirubin Urine: NEGATIVE
Glucose, UA: NEGATIVE mg/dL
Hgb urine dipstick: NEGATIVE
Ketones, ur: 5 mg/dL — AB
Leukocytes,Ua: NEGATIVE
Nitrite: NEGATIVE
Protein, ur: 100 mg/dL — AB
Specific Gravity, Urine: 1.016 (ref 1.005–1.030)
Squamous Epithelial / HPF: NONE SEEN (ref 0–5)
pH: 5 (ref 5.0–8.0)

## 2021-04-01 LAB — BASIC METABOLIC PANEL
Anion gap: 11 (ref 5–15)
Anion gap: 8 (ref 5–15)
BUN: 60 mg/dL — ABNORMAL HIGH (ref 8–23)
BUN: 56 mg/dL — ABNORMAL HIGH (ref 8–23)
CO2: 21 mmol/L — ABNORMAL LOW (ref 22–32)
CO2: 22 mmol/L (ref 22–32)
Calcium: 8.6 mg/dL — ABNORMAL LOW (ref 8.9–10.3)
Calcium: 8.2 mg/dL — ABNORMAL LOW (ref 8.9–10.3)
Chloride: 96 mmol/L — ABNORMAL LOW (ref 98–111)
Chloride: 100 mmol/L (ref 98–111)
Creatinine, Ser: 3.26 mg/dL — ABNORMAL HIGH (ref 0.44–1.00)
Creatinine, Ser: 3.13 mg/dL — ABNORMAL HIGH (ref 0.44–1.00)
GFR, Estimated: 15 mL/min — ABNORMAL LOW (ref >60)
GFR, Estimated: 16 mL/min — ABNORMAL LOW (ref >60)
Glucose, Bld: 107 mg/dL — ABNORMAL HIGH (ref 70–99)
Glucose, Bld: 92 mg/dL (ref 70–99)
Potassium: 3.9 mmol/L (ref 3.5–5.1)
Potassium: 4.9 mmol/L (ref 3.5–5.1)
Sodium: 128 mmol/L — ABNORMAL LOW (ref 135–145)
Sodium: 130 mmol/L — ABNORMAL LOW (ref 135–145)

## 2021-04-01 LAB — BLOOD CULTURE ID PANEL (REFLEXED) - BCID2

## 2021-04-01 LAB — PROTIME-INR
INR: 1.3 — ABNORMAL HIGH (ref 0.8–1.2)
Prothrombin Time: 15.7 seconds — ABNORMAL HIGH (ref 11.4–15.2)

## 2021-04-01 LAB — CBC WITH DIFFERENTIAL/PLATELET
Abs Immature Granulocytes: 0.06 10*3/uL (ref 0.00–0.07)
Basophils Absolute: 0 10*3/uL (ref 0.0–0.1)
Basophils Relative: 0 %
Eosinophils Absolute: 0 10*3/uL (ref 0.0–0.5)
Eosinophils Relative: 0 %
HCT: 43.5 % (ref 36.0–46.0)
Hemoglobin: 14 g/dL (ref 12.0–15.0)
Immature Granulocytes: 1 %
Lymphocytes Relative: 3 %
Lymphs Abs: 0.4 10*3/uL — ABNORMAL LOW (ref 0.7–4.0)
MCH: 28.8 pg (ref 26.0–34.0)
MCHC: 32.2 g/dL (ref 30.0–36.0)
MCV: 89.5 fL (ref 80.0–100.0)
Monocytes Absolute: 0 10*3/uL — ABNORMAL LOW (ref 0.1–1.0)
Monocytes Relative: 0 %
Neutro Abs: 12 10*3/uL — ABNORMAL HIGH (ref 1.7–7.7)
Neutrophils Relative %: 96 %
Platelets: 141 10*3/uL — ABNORMAL LOW (ref 150–400)
RBC: 4.86 MIL/uL (ref 3.87–5.11)
RDW: 14.6 % (ref 11.5–15.5)
WBC: 12.6 10*3/uL — ABNORMAL HIGH (ref 4.0–10.5)
nRBC: 0 % (ref 0.0–0.2)

## 2021-04-01 LAB — COMPREHENSIVE METABOLIC PANEL
ALT: 42 U/L (ref 0–44)
AST: 61 U/L — ABNORMAL HIGH (ref 15–41)
Albumin: 3.3 g/dL — ABNORMAL LOW (ref 3.5–5.0)
Alkaline Phosphatase: 512 U/L — ABNORMAL HIGH (ref 38–126)
Anion gap: 20 — ABNORMAL HIGH (ref 5–15)
BUN: 66 mg/dL — ABNORMAL HIGH (ref 8–23)
CO2: 15 mmol/L — ABNORMAL LOW (ref 22–32)
Calcium: 9.5 mg/dL (ref 8.9–10.3)
Chloride: 94 mmol/L — ABNORMAL LOW (ref 98–111)
Creatinine, Ser: 3.9 mg/dL — ABNORMAL HIGH (ref 0.44–1.00)
GFR, Estimated: 12 mL/min — ABNORMAL LOW (ref >60)
Glucose, Bld: 116 mg/dL — ABNORMAL HIGH (ref 70–99)
Potassium: 4.4 mmol/L (ref 3.5–5.1)
Sodium: 129 mmol/L — ABNORMAL LOW (ref 135–145)
Total Bilirubin: 4 mg/dL — ABNORMAL HIGH (ref 0.3–1.2)
Total Protein: 7 g/dL (ref 6.5–8.1)

## 2021-04-01 LAB — D-DIMER, QUANTITATIVE: D-Dimer, Quant: 4.67 ug/mL-FEU — ABNORMAL HIGH (ref 0.00–0.50)

## 2021-04-01 LAB — LACTIC ACID, PLASMA
Lactic Acid, Venous: 3.4 mmol/L (ref 0.5–1.9)
Lactic Acid, Venous: 3.6 mmol/L (ref 0.5–1.9)
Lactic Acid, Venous: 1.4 mmol/L (ref 0.5–1.9)
Lactic Acid, Venous: 1.2 mmol/L (ref 0.5–1.9)

## 2021-04-01 LAB — OSMOLALITY: Osmolality: 290 mOsm/kg (ref 275–295)

## 2021-04-01 LAB — SODIUM, URINE, RANDOM: Sodium, Ur: <10 mmol/L

## 2021-04-01 LAB — OSMOLALITY, URINE: Osmolality, Ur: 397 mOsm/kg (ref 300–900)

## 2021-04-01 LAB — APTT: aPTT: 36 seconds (ref 24–36)

## 2021-04-01 LAB — PROCALCITONIN: Procalcitonin: 14.05 ng/mL

## 2021-04-01 LAB — MRSA NEXT GEN BY PCR, NASAL: MRSA by PCR Next Gen: NOT DETECTED

## 2021-04-01 MED ORDER — OXYCODONE HCL 5 MG PO TABS
5.0000 mg | ORAL_TABLET | Freq: Four times a day (QID) | ORAL | Status: DC | PRN
Start: 2021-04-01 — End: 2021-04-04
  Administered 2021-04-01 – 2021-04-04 (×6): 5 mg via ORAL
  Filled 2021-04-01 (×6): qty 1

## 2021-04-01 MED ORDER — TECHNETIUM TO 99M ALBUMIN AGGREGATED
4.0000 | Freq: Once | INTRAVENOUS | Status: AC | PRN
  Administered 2021-04-01: 4.08 via INTRAVENOUS

## 2021-04-01 MED ORDER — DM-GUAIFENESIN ER 30-600 MG PO TB12
1.0000 | ORAL_TABLET | Freq: Two times a day (BID) | ORAL | Status: DC | PRN
Start: 2021-04-01 — End: 2021-04-04

## 2021-04-01 MED ORDER — SODIUM CHLORIDE 0.9 % IV SOLN
2.0000 g | Freq: Once | INTRAVENOUS | Status: AC
  Administered 2021-04-01: 2 g via INTRAVENOUS
  Filled 2021-04-01: qty 2

## 2021-04-01 MED ORDER — METRONIDAZOLE 500 MG/100ML IV SOLN
500.0000 mg | Freq: Once | INTRAVENOUS | Status: AC
  Administered 2021-04-01: 500 mg via INTRAVENOUS
  Filled 2021-04-01: qty 100

## 2021-04-01 MED ORDER — LACTATED RINGERS IV SOLN: INTRAVENOUS | Status: DC

## 2021-04-01 MED ORDER — ONDANSETRON HCL 4 MG/2ML IJ SOLN
4.0000 mg | Freq: Three times a day (TID) | INTRAMUSCULAR | Status: DC | PRN
Start: 2021-04-01 — End: 2021-04-04
  Administered 2021-04-01 – 2021-04-04 (×4): 4 mg via INTRAVENOUS
  Filled 2021-04-01 (×4): qty 2

## 2021-04-01 MED ORDER — SODIUM CHLORIDE 0.9 % IV SOLN: 2.0000 g | INTRAVENOUS | Status: DC

## 2021-04-01 MED ORDER — LACTATED RINGERS IV BOLUS
1000.0000 mL | Freq: Once | INTRAVENOUS | Status: AC
  Administered 2021-04-01: 1000 mL via INTRAVENOUS

## 2021-04-01 MED ORDER — HEPARIN SODIUM (PORCINE) 5000 UNIT/ML IJ SOLN
5000.0000 [IU] | Freq: Three times a day (TID) | INTRAMUSCULAR | Status: DC
  Filled 2021-04-01: qty 1

## 2021-04-01 MED ORDER — PREDNISONE 20 MG PO TABS
50.0000 mg | ORAL_TABLET | Freq: Three times a day (TID) | ORAL | Status: DC
  Administered 2021-04-01 – 2021-04-02 (×3): 50 mg via ORAL
  Filled 2021-04-01 (×3): qty 1

## 2021-04-01 MED ORDER — FENTANYL CITRATE PF 50 MCG/ML IJ SOSY
25.0000 ug | PREFILLED_SYRINGE | Freq: Once | INTRAMUSCULAR | Status: AC
  Administered 2021-04-01: 25 ug via INTRAVENOUS
  Filled 2021-04-01: qty 1

## 2021-04-01 MED ORDER — LACTATED RINGERS IV BOLUS
2000.0000 mL | Freq: Once | INTRAVENOUS | Status: AC
Start: 2021-04-01 — End: 2021-04-01
  Administered 2021-04-01: 2000 mL via INTRAVENOUS

## 2021-04-01 MED ORDER — FAMOTIDINE 20 MG PO TABS
20.0000 mg | ORAL_TABLET | Freq: Two times a day (BID) | ORAL | Status: DC | PRN
  Administered 2021-04-01 – 2021-04-04 (×3): 20 mg via ORAL
  Filled 2021-04-01 (×3): qty 1

## 2021-04-01 MED ORDER — SODIUM CHLORIDE 0.9 % IV SOLN
2.0000 g | INTRAVENOUS | Status: DC
  Administered 2021-04-02: 2 g via INTRAVENOUS
  Filled 2021-04-01: qty 2

## 2021-04-01 MED ORDER — FAMOTIDINE IN NACL 20-0.9 MG/50ML-% IV SOLN: 20.0000 mg | Freq: Two times a day (BID) | INTRAVENOUS | Status: DC | PRN

## 2021-04-01 MED ORDER — SODIUM CHLORIDE 0.9 % IV SOLN
INTRAVENOUS | Status: DC
Start: 2021-04-01 — End: 2021-04-02

## 2021-04-01 MED ORDER — VANCOMYCIN HCL IN DEXTROSE 1-5 GM/200ML-% IV SOLN
1000.0000 mg | Freq: Once | INTRAVENOUS | Status: AC
  Administered 2021-04-01: 1000 mg via INTRAVENOUS
  Filled 2021-04-01: qty 200

## 2021-04-01 MED ORDER — VITAMIN D 25 MCG (1000 UNIT) PO TABS
2000.0000 [IU] | ORAL_TABLET | Freq: Every day | ORAL | Status: DC
  Administered 2021-04-01 – 2021-04-03 (×3): 2000 [IU] via ORAL
  Filled 2021-04-01 (×3): qty 2

## 2021-04-01 MED ORDER — PANTOPRAZOLE SODIUM 40 MG PO TBEC
40.0000 mg | DELAYED_RELEASE_TABLET | Freq: Every day | ORAL | Status: DC
  Administered 2021-04-01 – 2021-04-04 (×4): 40 mg via ORAL
  Filled 2021-04-01 (×4): qty 1

## 2021-04-01 MED ORDER — ENSURE MAX PROTEIN PO LIQD
11.0000 [oz_av] | Freq: Two times a day (BID) | ORAL | Status: DC
  Administered 2021-04-01 (×2): 11 [oz_av] via ORAL
  Filled 2021-04-01: qty 330

## 2021-04-01 MED ORDER — ALBUTEROL SULFATE (2.5 MG/3ML) 0.083% IN NEBU
3.0000 mL | INHALATION_SOLUTION | RESPIRATORY_TRACT | Status: DC | PRN
Start: 2021-04-01 — End: 2021-04-04

## 2021-04-01 NOTE — Sepsis Progress Note (Signed)
Notified bedside nurse of need to administer antibiotics and collect a repeat Lactate after the fluid bolus is completed.  ?

## 2021-04-01 NOTE — Progress Notes (Signed)
CODE SEPSIS - PHARMACY COMMUNICATION ? ?**Broad Spectrum Antibiotics should be administered within 1 hour of Sepsis diagnosis** ? ?Time Code Sepsis Called/Page Received: 0768 ? ?Antibiotics Ordered: Cefepime + vancomycin + metronidazole ? ?Time of 1st antibiotic administration: 0906 ? ?Additional action taken by pharmacy: N/A ? ?Tanya Osborne ?04/01/2021  8:32 AM  ?

## 2021-04-01 NOTE — Progress Notes (Signed)
?   04/01/21 1500  ?Clinical Encounter Type  ?Visited With Patient and family together  ?Visit Type Initial  ?Spiritual Encounters  ?Spiritual Needs Prayer  ? ?Chaplain provided support through meaningful conversation, compassionate presence and prayer.  ?

## 2021-04-01 NOTE — Sepsis Progress Note (Signed)
ELink tracking the code Sepsis. ?

## 2021-04-01 NOTE — H&P (Signed)
?History and Physical  ? ? ?Tanya Osborne ZOX:[id number] DOB: 15-Dec-1960 DOA: 04/01/2021 ? ?Referring MD/NP/PA:  ? ?PCP: Annice Needy, MD  ? ?Patient coming from:  The patient is coming from home.   ? ? ?Chief Complaint: SOB ? ?HPI: Tanya Osborne is a 61 y.o. female with medical history significant of rheumatoid arthritis on prednisone chronically, hypertension, hyperlipidemia, GERD, primary sclerosing cholangitis, CKD-3B, anemia, perforated duodenal ulcer, autoimmune liver disease, small bowel obstruction, colon cancer (s/p colectomy and end ileostomy at [facility] in February of 2020. Currently not chemotherapy, seen by palliative care), who presents with SOB. ? ?Per her daughter, patient complains of shortness of breath in early morning, and was found to have oxygen desaturating to 75% on room air.  Patient was put on 2 L nasal cannula oxygen with saturation 96% in the emergency room.  Patient is not using oxygen normally.  She has cough with clear mucus production, denies chest pain, fever or chills.  Patient has chronic intermittent nausea vomiting, which has not changed.  No diarrhea or abdominal pain.  Denies symptoms of UTI.  No sacral wound.  Patient also reports some headache. ? ?Patient was found to have hypotension with blood pressure 77/79, which improved to 84/68 with MAP 71 after giving 2 L of LR bolus in ED. mental status normal. ? ?Data Reviewed and ED Course: pt was found to have WBC 12.6, negative COVID PCR, positive D-dimer 4.67, negative urinalysis, lactic acid 3.4, INR 1.3, PTT 36, worsening renal function (with GFR 12, creatinine 3.90 and BUN 66) baseline creatinine 1.9 on 02/20/2021), sodium 129, heart rate 130, RR 22, negative chest x-ray.  Patient is admitted to stepdown as inpatient ? ? ?EKG: I have personally reviewed.  Sinus rhythm, QTc 458, LAE, low voltage, nonspecific T wave change ? ?Review of Systems:  ? ?General: no fevers, chills, no body weight gain, has poor appetite, has  fatigue ?HEENT: no blurry vision, hearing changes or sore throat ?Respiratory: has dyspnea, coughing, no wheezing ?CV: no chest pain, no palpitations ?GI: has chronic nausea, vomiting, no abdominal pain, diarrhea, constipation ?GU: no dysuria, burning on urination, increased urinary frequency, hematuria  ?Ext: no leg edema ?Neuro: no unilateral weakness, numbness, or tingling, no vision change or hearing loss ?Skin: no rash, no skin tear. ?MSK: No muscle spasm, no deformity, no limitation of range of movement in spin ?Heme: No easy bruising.  ?Travel history: No recent long distant travel. ? ? ?Allergy:  ?Allergies  ?Allergen Reactions  ? Nsaids Swelling and Rash  ?  Other Reaction: OTHER REACTION-GI BLEED  ? Penicillins Swelling  ? Percocet [Oxycodone-Acetaminophen] Nausea And Vomiting  ? Celebrex [Celecoxib] Swelling  ? Cortisone Swelling  ? Gabapentin Swelling  ? Methotrexate Derivatives Swelling  ? Nabumetone Other (See Comments)  ? Tomato Rash  ?  Joint inflamation  ? ? ?Past Medical History:  ?Diagnosis Date  ? Autoimmune liver disease   ? Cancer [facility])   ? Depression   ? Duodenal ulcer perforation ([place]) 09/08/2008  ? GERD (gastroesophageal reflux disease)   ? H. pylori infection 2010  ? Hypercholesterolemia 2018  ? Iron deficiency anemia   ? Kidney disease, chronic, stage IV (GFR 15-29 ml/min) (HCC)   ? 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 RA (rheumatoid arthritis) ([place])   ? ? ?Past Surgical History:  ?Procedure Laterality Date  ? INCISION AND DRAINAGE ABSCESS Left 10/21/2020  ? Procedure: INCISION AND DRAINAGE ABSCESS;  Surgeon: Ronny Bacon, MD;  Location: [facility] ORS;  Service: General;  Laterality: Left;  ? PYLOROPLASTY  09/08/2008  ? with omental patch  ? TOTAL COLECTOMY  03/06/2018  ? total abdominal colectomy with end ileostomy  ? TUBAL LIGATION    ? ? ?Social History:  reports that she has never smoked. She has never used smokeless tobacco. She reports that she does not drink alcohol and does  not use drugs. ? ?Family History:  ?Family History  ?Problem Relation Age of Onset  ? Heart disease Mother   ? Lung disease Mother   ?  ? ?Prior to Admission medications   ?Medication Sig Start Date End Date Taking? Authorizing Provider  ?Cholecalciferol 50 MCG (2000 UT) CAPS Take 2,000 Units by mouth daily.   Yes [provider]  ?oxyCODONE (OXY IR/ROXICODONE) 5 MG immediate release tablet Take 5 mg by mouth every 6 (six) hours as needed for severe pain.   Yes [provider]  ?pantoprazole (PROTONIX) 40 MG tablet Take 40 mg by mouth daily. 04/09/20 04/09/21 Yes [provider]  ?predniSONE (DELTASONE) 5 MG tablet Take 5 mg by mouth daily. 09/22/20  Yes [provider]  ?clobetasol ointment (TEMOVATE) 3.81 % Apply 1 application topically 2 (two) times daily. 07/06/19   [provider]  ?[facility] Liver Oil 5000-500 UNIT/5ML OIL Take 1 capsule by mouth daily. ?Patient not taking: Reported on 04/01/2021    [provider]  ?dexamethasone (DECADRON) 4 MG tablet Take 4-8 mg by mouth daily.    [provider]  ?Ensure Max Protein (ENSURE MAX PROTEIN) LIQD Take 330 mLs (11 oz total) by mouth 2 (two) times daily. 10/29/20   Ezekiel Slocumb, DO  ?HYDROcodone-acetaminophen (NORCO/VICODIN) 5-325 MG tablet Take 1-2 tablets by mouth every 4 (four) hours as needed for moderate pain or severe pain. ?Patient not taking: Reported on 04/01/2021 10/29/20 10/29/21  Nicole Kindred A, DO  ?lidocaine-prilocaine (EMLA) cream Apply 1 application topically once. 11/28/19   [provider]  ?Multiple Vitamin (MULTI-VITAMIN) tablet Take 1 tablet by mouth daily. ?Patient not taking: Reported on 10/21/2020    [provider]  ? ? ?Physical Exam: ?Vitals:  ? 04/01/21 0737 04/01/21 0175 04/01/21 0740 04/01/21 0942  ?BP: (!) 77/39   (!) 88/61  ?Pulse:   (!) 130 (!) 107  ?Resp: 20  (!) 22 (!) 31  ?Temp: 98.9 ?F (37.2 ?C)   98.6 ?F (37 ?C)  ?TempSrc: Oral   Oral  ?SpO2: 96%    100%  ?Weight:  62 kg    ?Height:  '5\' 5"'$  (1.651 m)    ? ?General: Not in acute distress.  Thin body habitus ?HEENT: ?      Eyes: PERRL, EOMI, no scleral icterus. ?      ENT: No discharge from the ears and nose, no pharynx injection, no tonsillar enlargement.  ?      Neck: No JVD, no bruit, no mass felt. ?Heme: No neck lymph node enlargement. ?Cardiac: S1/S2, RRR, No murmurs, No gallops or rubs. ?Respiratory: No rales, wheezing, rhonchi or rubs. ?GI: Soft, nondistended, nontender, no rebound pain, no organomegaly, BS present. S/p of colostomy ?GU: No hematuria ?Ext: No pitting leg edema bilaterally. 1+DP/PT pulse bilaterally. ?Musculoskeletal: No joint deformities, No joint redness or warmth, no limitation of ROM in spin. ?Skin: No rashes.  ?Neuro: Alert, oriented X3, cranial nerves II-XII grossly intact, moves all extremities normally.  ?Psych: Patient is not psychotic, no suicidal or hemocidal ideation. ? ?Labs on Admission: I have personally reviewed following labs and imaging studies ? ?  CBC: ?Recent Labs  ?Lab 04/01/21 ?0741  ?WBC 12.6*  ?NEUTROABS 12.0*  ?HGB 14.0  ?HCT 43.5  ?MCV 89.5  ?PLT 141*  ? ?Basic Metabolic Panel: ?Recent Labs  ?Lab 04/01/21 ?0741 04/01/21 ?1217  ?NA 129* 128*  ?K 4.4 3.9  ?CL 94* 96*  ?CO2 15* 21*  ?GLUCOSE 116* 107*  ?BUN 66* 60*  ?CREATININE 3.90* 3.26*  ?CALCIUM 9.5 8.6*  ? ?GFR: ?Estimated Creatinine Clearance: 16.1 mL/min (A) (by C-G formula based on SCr of 3.26 mg/dL (H)). ?Liver Function Tests: ?Recent Labs  ?Lab 04/01/21 ?0741  ?AST 61*  ?ALT 42  ?ALKPHOS 512*  ?BILITOT 4.0*  ?PROT 7.0  ?ALBUMIN 3.3*  ? ?No results for input(s): LIPASE, AMYLASE in the last 168 hours. ?No results for input(s): AMMONIA in the last 168 hours. ?Coagulation Profile: ?Recent Labs  ?Lab 04/01/21 ?0741  ?INR 1.3*  ? ?Cardiac Enzymes: ?No results for input(s): CKTOTAL, CKMB, CKMBINDEX, TROPONINI in the last 168 hours. ?BNP (last 3 results) ?No results for input(s): PROBNP in the last 8760  hours. ?HbA1C: ?No results for input(s): HGBA1C in the last 72 hours. ?CBG: ?No results for input(s): GLUCAP in the last 168 hours. ?Lipid Profile: ?No results for input(s): CHOL, HDL, LDLCALC, TRIG, CHOLHDL, LDLDIRECT in

## 2021-04-01 NOTE — ED Provider Notes (Signed)
? ?[facility] ?Provider Note ? ? ? Event Date/Time  ? First MD Initiated Contact with Patient 04/01/21 0745   ?  (approximate) ? ? ?History  ? ?Shortness of Breath (Chills , sob, headache, cancer patient colon , colostomy bag, unknown dnr, sepsis alert per ems ) ? ? ?HPI ? ?Tanya Osborne is a 61 y.o. female who presents to the ED for evaluation of Shortness of Breath (Chills , sob, headache, cancer patient colon , colostomy bag, unknown dnr, sepsis alert per ems ) ?  ?I reviewed outpatient palliative care visit from 3/8.  History of metastatic colon cancer.  On supportive care without active treatment.  Getting fluids every couple weeks at home.  Ostomy present.  Daughter is healthcare power of attorney, Lesleigh Noe but on another document it is [place].  Does not look like that they have decided on hospice and I do not see any DNR in the system. ? ?Patient presents to the ED from home via EMS for evaluation of chills, headache, dyspnea, dehydration, poor appetite.  Daughter provides majority of history at the bedside.  Patient reports primarily headache without syncopal episodes, trauma, vision changes or fever.  She reports dry lips and poor appetite without emesis or acutely worsening abdominal pain. ? ?Daughters at the bedside reports she gets IV fluids every 2 weeks, and today is the day for this.  She was so weak that they thought they needed to come to the hospital.  Patient has had poor appetite, losing weight.  ? ?Physical Exam  ? ?Triage Vital Signs: ?ED Triage Vitals  ?Enc Vitals Group  ?   BP 04/01/21 0737 (!) 77/39  ?   Pulse --   ?   Resp 04/01/21 0737 20  ?   Temp 04/01/21 0737 98.9 ?F (37.2 ?C)  ?   Temp Source 04/01/21 0737 Oral  ?   SpO2 04/01/21 0737 96 %  ?   Weight 04/01/21 0738 136 lb 11 oz (62 kg)  ?   Height 04/01/21 0738 '5\' 5"'$  (1.651 m)  ?   Head Circumference --   ?   Peak Flow --   ?   Pain Score --   ?   Pain Loc --   ?   Pain Edu? --   ?   Excl. in [place]? --   ? ? ?Most  recent vital signs: ?Vitals:  ? 04/01/21 0737 04/01/21 0740  ?BP: (!) 77/39   ?Pulse:  (!) 130  ?Resp: 20 (!) 22  ?Temp: 98.9 ?F (37.2 ?C)   ?SpO2: 96%   ? ? ?General: Frail, cachectic.  Awake without distress.  Follows commands in all 4.  Helps turn when we look at her sacrum. ?CV:  Good peripheral perfusion.  Tachycardic and regular. ?Resp:  Normal effort.  Minimal tachypnea on arrival, resolving with analgesia and IV fluids.  Clear lungs. ?Abd:  No distention.  Ostomy bag to right-sided abdomen.  No peritoneal features. ?MSK:  No deformity noted.  Very poor muscle mass.  No sacral wounds, induration or fluctuance. ?Neuro:  No focal deficits appreciated. ?Other:   ? ? ?ED Results / Procedures / Treatments  ? ?Labs ?(all labs ordered are listed, but only abnormal results are displayed) ?Labs Reviewed  ?LACTIC ACID, PLASMA - Abnormal; Notable for the following components:  ?    Result Value  ? Lactic Acid, Venous 3.4 (*)   ? All other components within normal limits  ?COMPREHENSIVE METABOLIC PANEL - Abnormal; Notable  for the following components:  ? Sodium 129 (*)   ? Chloride 94 (*)   ? CO2 15 (*)   ? Glucose, Bld 116 (*)   ? BUN 66 (*)   ? Creatinine, Ser 3.90 (*)   ? Albumin 3.3 (*)   ? AST 61 (*)   ? Alkaline Phosphatase 512 (*)   ? Total Bilirubin 4.0 (*)   ? GFR, Estimated 12 (*)   ? Anion gap 20 (*)   ? All other components within normal limits  ?CBC WITH DIFFERENTIAL/PLATELET - Abnormal; Notable for the following components:  ? WBC 12.6 (*)   ? Platelets 141 (*)   ? Neutro Abs 12.0 (*)   ? Lymphs Abs 0.4 (*)   ? Monocytes Absolute 0.0 (*)   ? All other components within normal limits  ?PROTIME-INR - Abnormal; Notable for the following components:  ? Prothrombin Time 15.7 (*)   ? INR 1.3 (*)   ? All other components within normal limits  ?D-DIMER, QUANTITATIVE - Abnormal; Notable for the following components:  ? D-Dimer, Quant 4.67 (*)   ? All other components within normal limits  ?RESP PANEL BY RT-PCR (FLU  A&B, COVID) [id number]  ?CULTURE, BLOOD (ROUTINE X 2)  ?CULTURE, BLOOD (ROUTINE X 2)  ?URINE CULTURE  ?APTT  ?PROCALCITONIN  ?LACTIC ACID, PLASMA  ?URINALYSIS, COMPLETE (UACMP) WITH MICROSCOPIC  ? ? ?EKG ?Sinus tachycardia with a rate of 123 bpm.  Normal axis and intervals.  Tremulous baseline.  No evidence of acute ischemia ? ?RADIOLOGY ?CXR reviewed by me without evidence of acute cardiopulmonary pathology. ? ?Official radiology report(s): ?DG Chest Port 1 View ? ?Result Date: 04/01/2021 ?CLINICAL DATA:  Questionable sepsis - evaluate for abnormality EXAM: PORTABLE CHEST 1 VIEW COMPARISON:  Chest radiograph October 21, 2020. FINDINGS: The heart size and mediastinal contours are within normal limits. No consolidation. No visible pleural effusions or pneumothorax. No acute osseous abnormality. Right IJ Port-A-Cath with the tip projecting at the lower SVC, unchanged. IMPRESSION: No active disease. Electronically Signed   By: Margaretha Sheffield M.D.   On: 04/01/2021 08:00   ? ?PROCEDURES and INTERVENTIONS: ? ?.1-3 Lead EKG Interpretation ?Performed by: Vladimir Crofts, MD ?Authorized by: Vladimir Crofts, MD  ? ?  Interpretation: abnormal   ?  ECG rate:  126 ?  ECG rate assessment: tachycardic   ?  Rhythm: sinus tachycardia   ?  Ectopy: none   ?  Conduction: normal   ?.Critical Care ?Performed by: Vladimir Crofts, MD ?Authorized by: Vladimir Crofts, MD  ? ?Critical care provider statement:  ?  Critical care time (minutes):  30 ?  Critical care time was exclusive of:  Separately billable procedures and treating other patients ?  Critical care was necessary to treat or prevent imminent or life-threatening deterioration of the following conditions:  Sepsis ?  Critical care was time spent personally by me on the following activities:  Development of treatment plan with patient or surrogate, discussions with consultants, evaluation of patient's response to treatment, examination of patient, ordering and review of laboratory studies, ordering  and review of radiographic studies, ordering and performing treatments and interventions, pulse oximetry, re-evaluation of patient's condition and review of old charts ?Ultrasound ED Peripheral IV (Provider) ? ?Date/Time: 04/01/2021 9:23 AM ?Performed by: Vladimir Crofts, MD ?Authorized by: Vladimir Crofts, MD  ? ?Procedure details:  ?  Indications: hydration, hypotension, multiple failed IV attempts and poor IV access   ?  Skin Prep: chlorhexidine gluconate   ?  Location: left basilic v. ?  Angiocath:  20 G ?  Bedside Ultrasound Guided: Yes   ?  Images: not archived   ?  Patient tolerated procedure without complications: Yes   ?  Dressing applied: Yes   ? ?Medications  ?lactated ringers infusion ( Intravenous New Bag/Given 04/01/21 0855)  ?ceFEPIme (MAXIPIME) 2 g in sodium chloride 0.9 % 100 mL IVPB (2 g Intravenous New Bag/Given 04/01/21 0906)  ?metroNIDAZOLE (FLAGYL) IVPB 500 mg (500 mg Intravenous New Bag/Given 04/01/21 0907)  ?vancomycin (VANCOCIN) IVPB 1000 mg/200 mL premix (has no administration in time range)  ?lactated ringers bolus 2,000 mL (2,000 mLs Intravenous New Bag/Given 04/01/21 0751)  ?fentaNYL (SUBLIMAZE) injection 25 mcg (25 mcg Intravenous Given 04/01/21 0807)  ? ? ? ?IMPRESSION / MDM / ASSESSMENT AND PLAN / ED COURSE  ?I reviewed the triage vital signs and the nursing notes. ? ?Advanced colon cancer patient presents to the ED with signs of dehydration and sepsis of unknown etiology requiring medical admission for IV fluids, antibiotics and discussions with palliative care.  She presents tachycardic and hypotensive, pressures improving with IV fluids and without evidence of septic shock.  SIRS criteria with her vital signs and some mild leukocytosis.  She appears dry on examination.  There is metabolic derangements with AKI, metabolic acidosis and hyponatremia.  Lactic acidosis is noted as well as her elevated procalcitonin, suggestive of bacterial etiology of sepsis.  Cultures were drawn and broad-spectrum  antibiotics were provided as I have no certain etiology.  CXR is clear and urine is pending the time of admission.  As below, various discussions about plan of care.  Poor prognosis. ? ?Clinical Cou

## 2021-04-01 NOTE — Sepsis Progress Note (Signed)
Notified bedside nurse of need to draw repeat lactic acid. 

## 2021-04-01 NOTE — Progress Notes (Signed)
Pharmacy Antibiotic Note ? ?Tanya Osborne is a 61 y.o. female with a chief complaint of shortness of breath. Admitted on 04/01/2021 with sepsis. PMH includes metastatic colon cancer with colostomy bag. Pharmacy has been consulted for vancomycin and cefepime dosing. ? ?Hypotensive, tachycardic and tachypneic with O2sat 96% on 2L [place]. Afebrile. WBC slightly elevated. Source is unclear. Renal function unstable with a Scr that is significantly elevated above baseline (Baseline Scr ~1.50). Cefepime 2 grams x 1 and vancomycin 1 gram x 1 given 3/22 AM. ? ?Past allergy to penicillins noted. Received cefepime x 1 in ED. ? ?Plan: ?Will order vancomycin random level with AM labs. ?Continue cefepime 2 grams every 24 hours. ? ?Monitor renal function and culture results. ? ?Height: '5\' 5"'$  (165.1 cm) ?Weight: 62 kg (136 lb 11 oz) ?IBW/kg (Calculated) : 57 ? ?Temp (24hrs), Avg:98.8 ?F (37.1 ?C), Min:98.6 ?F (37 ?C), Max:98.9 ?F (37.2 ?C) ? ?Recent Labs  ?Lab 04/01/21 ?0741 04/01/21 ?1036 04/01/21 ?1217 04/01/21 ?1526  ?WBC 12.6*  --   --   --   ?CREATININE 3.90*  --  3.26*  --   ?LATICACIDVEN 3.4* 3.6*  --  1.4  ?  ?Estimated Creatinine Clearance: 16.1 mL/min (A) (by C-G formula based on SCr of 3.26 mg/dL (H)).   ? ?Allergies  ?Allergen Reactions  ? Nsaids Swelling and Rash  ?  Other Reaction: OTHER REACTION-GI BLEED  ? Penicillins Swelling  ? Percocet [Oxycodone-Acetaminophen] Nausea And Vomiting  ? Celebrex [Celecoxib] Swelling  ? Cortisone Swelling  ? Gabapentin Swelling  ? Methotrexate Derivatives Swelling  ? Nabumetone Other (See Comments)  ? Tomato Rash  ?  Joint inflamation  ? ? ?Antimicrobials this admission: ?3/22 cefepime >>  ?3/22 vancomycin >>  ? ?Dose adjustments this admission: ?N/a ? ?Microbiology results: ?3/22 BCx: in process ?3/22 UCx:  in process ?3/22 MRSA PCR: Not detected ? ?Thank you for allowing pharmacy to be a part of this patient?s care. ? ?Wynelle Cleveland, PharmD ?Pharmacy Resident  ?04/01/2021 ?5:53  PM ? ?

## 2021-04-01 NOTE — ED Triage Notes (Signed)
Chills , sob, headache, cancer patient colon , colostomy bag, unknown dnr, sepsis alert per ems , ems no iv , pt has a port  ?

## 2021-04-01 NOTE — Consult Note (Signed)
PHARMACY -  BRIEF ANTIBIOTIC NOTE  ? ?Pharmacy has received consult(s) for vancomycin and cefepime from an ED provider. Patient is also ordered metronidazole. The patient's profile has been reviewed for ht/wt/allergies/indication/available labs.   ? ?Hx colon cancer. Has port. AKI on CKD noted ? ?One time order(s) placed for  ?--Cefepime 2 g IV  ?--Vancomycin 1 g IV ? ?Further antibiotics/pharmacy consults should be ordered by admitting physician if indicated.       ?                ?Thank you, ?Benita Gutter ?04/01/2021  8:30 AM  ?

## 2021-04-01 NOTE — Progress Notes (Signed)
PHARMACY - PHYSICIAN COMMUNICATION ?CRITICAL VALUE ALERT - BLOOD CULTURE IDENTIFICATION (BCID) ? ?Tanya Osborne is an 61 y.o. female who presented to [facility] on 04/01/2021 with a chief complaint of shortness of breath.  ? ?Assessment:  Blood cultures growing klebsiella pneumoniae growing in 2 of 4 bottles, unclear source.    ? ?Name of physician (or Provider) Contacted: Neomia Glass ? ?Current antibiotics: vancomycin and cefepime  ? ?Changes to prescribed antibiotics recommended:  ?Recommendations accepted by provider, vancomycin and cefepime narrowed to ceftriaxone 2 g IV q24h  ? ?Results for orders placed or performed during the hospital encounter of 04/01/21  ?Blood Culture ID Panel (Reflexed) (Collected: 04/01/2021  7:41 AM)  ?Result Value Ref Range  ? Enterococcus faecalis NOT DETECTED NOT DETECTED  ? Enterococcus Faecium NOT DETECTED NOT DETECTED  ? Listeria monocytogenes NOT DETECTED NOT DETECTED  ? Staphylococcus species NOT DETECTED NOT DETECTED  ? Staphylococcus aureus (BCID) NOT DETECTED NOT DETECTED  ? Staphylococcus epidermidis NOT DETECTED NOT DETECTED  ? Staphylococcus lugdunensis NOT DETECTED NOT DETECTED  ? Streptococcus species NOT DETECTED NOT DETECTED  ? Streptococcus agalactiae NOT DETECTED NOT DETECTED  ? Streptococcus pneumoniae NOT DETECTED NOT DETECTED  ? Streptococcus pyogenes NOT DETECTED NOT DETECTED  ? A.calcoaceticus-baumannii NOT DETECTED NOT DETECTED  ? Bacteroides fragilis NOT DETECTED NOT DETECTED  ? Enterobacterales DETECTED (A) NOT DETECTED  ? Enterobacter cloacae complex NOT DETECTED NOT DETECTED  ? Escherichia coli NOT DETECTED NOT DETECTED  ? Klebsiella aerogenes NOT DETECTED NOT DETECTED  ? Klebsiella oxytoca NOT DETECTED NOT DETECTED  ? Klebsiella pneumoniae DETECTED (A) NOT DETECTED  ? Proteus species NOT DETECTED NOT DETECTED  ? Salmonella species NOT DETECTED NOT DETECTED  ? Serratia marcescens NOT DETECTED NOT DETECTED  ? Haemophilus influenzae NOT DETECTED NOT DETECTED   ? Neisseria meningitidis NOT DETECTED NOT DETECTED  ? Pseudomonas aeruginosa NOT DETECTED NOT DETECTED  ? Stenotrophomonas maltophilia NOT DETECTED NOT DETECTED  ? Candida albicans NOT DETECTED NOT DETECTED  ? Candida auris NOT DETECTED NOT DETECTED  ? Candida glabrata NOT DETECTED NOT DETECTED  ? Candida krusei NOT DETECTED NOT DETECTED  ? Candida parapsilosis NOT DETECTED NOT DETECTED  ? Candida tropicalis NOT DETECTED NOT DETECTED  ? Cryptococcus neoformans/gattii NOT DETECTED NOT DETECTED  ? CTX-M ESBL NOT DETECTED NOT DETECTED  ? Carbapenem resistance IMP NOT DETECTED NOT DETECTED  ? Carbapenem resistance KPC NOT DETECTED NOT DETECTED  ? Carbapenem resistance NDM NOT DETECTED NOT DETECTED  ? Carbapenem resist OXA 48 LIKE NOT DETECTED NOT DETECTED  ? Carbapenem resistance VIM NOT DETECTED NOT DETECTED  ? ? ?Darnelle Bos, PharmD ?04/01/2021  8:20 PM ? ?

## 2021-04-01 NOTE — Sepsis Progress Note (Signed)
Notified provider of need to order additional fluid bolus and a repeat lactate at 1:00PM. ?

## 2021-04-01 NOTE — Progress Notes (Signed)
Patient alert and oriented x4 in ST, clear/diminished lung sounds on room air. Patient refused Heparin and SCDs, patient education provided. Patient had 125 out for my shift, bladder scan performed. Less than 35 mls. MD notified of intake/output.Patient given PRN medication for headache/heartburn/and nausea vomiting per orders. Patient resting comfortably bed.                                                   ?

## 2021-04-02 ENCOUNTER — Encounter: Payer: Self-pay | Admitting: Internal Medicine

## 2021-04-02 ENCOUNTER — Inpatient Hospital Stay: Payer: Medicare Other

## 2021-04-02 DIAGNOSIS — C189 Malignant neoplasm of colon, unspecified: Secondary | ICD-10-CM

## 2021-04-02 DIAGNOSIS — R7989 Other specified abnormal findings of blood chemistry: Secondary | ICD-10-CM

## 2021-04-02 DIAGNOSIS — Z7189 Other specified counseling: Secondary | ICD-10-CM

## 2021-04-02 LAB — BASIC METABOLIC PANEL
Anion gap: 8 (ref 5–15)
Anion gap: 12 (ref 5–15)
BUN: 56 mg/dL — ABNORMAL HIGH (ref 8–23)
BUN: 56 mg/dL — ABNORMAL HIGH (ref 8–23)
CO2: 19 mmol/L — ABNORMAL LOW (ref 22–32)
CO2: 19 mmol/L — ABNORMAL LOW (ref 22–32)
Calcium: 8.2 mg/dL — ABNORMAL LOW (ref 8.9–10.3)
Calcium: 8.1 mg/dL — ABNORMAL LOW (ref 8.9–10.3)
Chloride: 105 mmol/L (ref 98–111)
Chloride: 104 mmol/L (ref 98–111)
Creatinine, Ser: 2.84 mg/dL — ABNORMAL HIGH (ref 0.44–1.00)
Creatinine, Ser: 2.65 mg/dL — ABNORMAL HIGH (ref 0.44–1.00)
GFR, Estimated: 18 mL/min — ABNORMAL LOW (ref >60)
GFR, Estimated: 20 mL/min — ABNORMAL LOW (ref >60)
Glucose, Bld: 99 mg/dL (ref 70–99)
Glucose, Bld: 108 mg/dL — ABNORMAL HIGH (ref 70–99)
Potassium: 4.8 mmol/L (ref 3.5–5.1)
Potassium: 4.4 mmol/L (ref 3.5–5.1)
Sodium: 132 mmol/L — ABNORMAL LOW (ref 135–145)
Sodium: 135 mmol/L (ref 135–145)

## 2021-04-02 LAB — CBC
HCT: 32 % — ABNORMAL LOW (ref 36.0–46.0)
Hemoglobin: 10.7 g/dL — ABNORMAL LOW (ref 12.0–15.0)
MCH: 29.4 pg (ref 26.0–34.0)
MCHC: 33.4 g/dL (ref 30.0–36.0)
MCV: 87.9 fL (ref 80.0–100.0)
Platelets: 86 10*3/uL — ABNORMAL LOW (ref 150–400)
RBC: 3.64 MIL/uL — ABNORMAL LOW (ref 3.87–5.11)
RDW: 14.6 % (ref 11.5–15.5)
WBC: 25.3 10*3/uL — ABNORMAL HIGH (ref 4.0–10.5)
nRBC: 0 % (ref 0.0–0.2)

## 2021-04-02 LAB — URINE CULTURE: Culture: NO GROWTH

## 2021-04-02 LAB — PROCALCITONIN: Procalcitonin: 51.96 ng/mL

## 2021-04-02 LAB — FIBRINOGEN: Fibrinogen: 521 mg/dL — ABNORMAL HIGH (ref 210–475)

## 2021-04-02 MED ORDER — IOHEXOL 12 MG/ML PO SOLN: 500.0000 mL | ORAL | Status: DC

## 2021-04-02 MED ORDER — SODIUM CHLORIDE 0.9 % IV SOLN
2.0000 g | INTRAVENOUS | Status: DC
  Administered 2021-04-03 – 2021-04-04 (×2): 2 g via INTRAVENOUS
  Filled 2021-04-02: qty 20
  Filled 2021-04-02: qty 2

## 2021-04-02 MED ORDER — LACTATED RINGERS IV SOLN: INTRAVENOUS | Status: DC

## 2021-04-02 MED ORDER — GLUCERNA SHAKE PO LIQD
237.0000 mL | Freq: Three times a day (TID) | ORAL | Status: DC
  Administered 2021-04-02 – 2021-04-04 (×4): 237 mL via ORAL

## 2021-04-02 MED ORDER — IOHEXOL 9 MG/ML PO SOLN
500.0000 mL | ORAL | Status: AC
  Administered 2021-04-02: 500 mL via ORAL

## 2021-04-02 MED ORDER — PIPERACILLIN-TAZOBACTAM IN DEX 2-0.25 GM/50ML IV SOLN
2.2500 g | Freq: Three times a day (TID) | INTRAVENOUS | Status: DC
  Filled 2021-04-02: qty 50

## 2021-04-02 MED ORDER — PREDNISONE 20 MG PO TABS
50.0000 mg | ORAL_TABLET | Freq: Every day | ORAL | Status: DC
  Filled 2021-04-02: qty 1

## 2021-04-02 MED ORDER — METRONIDAZOLE 500 MG/100ML IV SOLN
500.0000 mg | Freq: Two times a day (BID) | INTRAVENOUS | Status: DC
  Administered 2021-04-02 – 2021-04-04 (×4): 500 mg via INTRAVENOUS
  Filled 2021-04-02 (×4): qty 100

## 2021-04-02 MED ORDER — ADULT MULTIVITAMIN W/MINERALS CH
1.0000 | ORAL_TABLET | Freq: Every day | ORAL | Status: DC
  Administered 2021-04-03 – 2021-04-04 (×2): 1 via ORAL
  Filled 2021-04-02 (×2): qty 1

## 2021-04-02 NOTE — Assessment & Plan Note (Signed)
Blood pressure soft on admission, now within lower normal goal ?-Keep holding antihypertensives ?

## 2021-04-02 NOTE — Assessment & Plan Note (Signed)
Resolved-patient is now on room air ?Patient requiring up to 2 L of oxygen as she was desaturating in 70s on arrival.  No prior oxygen use.  Most likely secondary to sepsis. ?

## 2021-04-02 NOTE — Assessment & Plan Note (Signed)
Patient met severe sepsis criteria with leukocytosis, tachycardia, tachypnea, lactic acidosis and AKI.  Blood cultures growing Klebsiella pneumonia, urine cultures pending.  CT abdomen was obtained for concern of intra-abdominal process which shows quite impressive disease progression and a concern of intra-abdominal abscess.  Palliative care is also on board and having a family meeting to discuss goals of care.  Patient does not want more surgeries. ?-Switch antibiotic to Zosyn ?-We will consult surgery or IR-pending goals of care discussion. ?-Continue with supportive care ?

## 2021-04-02 NOTE — Progress Notes (Signed)
Dr. Reesa Chew notified of patients platelet count of 86. No additional orders.   ?

## 2021-04-02 NOTE — Assessment & Plan Note (Signed)
Most likely secondary to poor p.o. intake.  Sodium improving with IV fluid. ?-Continue to monitor ?

## 2021-04-02 NOTE — TOC Initial Note (Signed)
Transition of Care (TOC) - Initial/Assessment Note  ? ? ?Patient Details  ?Name: Tanya Osborne ?MRN: [id number] ?Date of Birth: 01-03-1961 ? ?Transition of Care (TOC) CM/SW Contact:    ?Shelbie Hutching, RN ?Phone Number: ?04/02/2021, 5:05 PM ? ?Clinical Narrative:                 ?Patient admitted to the hospital with sepsis.  RNCM met with patient at the bedside introduced self and explained role.  Patient is from home where she lives with her daughter, she reports that she has been getting around okay at home and she can fix her own meals sometimes, she walks with a walker.  Daughter works from home. Daughter provides all transportation.  Patient has a history of colon cancer and has an ostomy.  Daughter changes the ostomy appliance weekly.   ?Patient I current with her PCP.  She would like to have some help at home she does not want to end up in a facility like her mother was in. ?Palliative care consulted and they were to have a meeting at 1345 today with patient and daughters.   ? ?TOC will follow, will try to arrange home health services before discharge.   ? ?Expected Discharge Plan: [place] ?Barriers to Discharge: Continued Medical Work up ? ? ?Patient Goals and CMS Choice ?Patient states their goals for this hospitalization and ongoing recovery are:: patient wants to go home, she does not want to end up in a nursing facility ?CMS Medicare.gov Compare Post Acute Care list provided to:: Patient ?Choice offered to / list presented to : Patient ? ?Expected Discharge Plan and Services ?Expected Discharge Plan: [place] ?  ?Discharge Planning Services: CM Consult ?Post Acute Care Choice: Home Health ?Living arrangements for the past 2 months: [place] ?                ?DME Arranged: N/A ?DME Agency: NA ?  ?  ?  ?HH Arranged: RN, PT, OT, Nurse's Aide, Social Work ?  ?  ?  ?  ? ?Prior Living Arrangements/Services ?Living arrangements for the past 2 months: [place] ?Lives with:: Adult Children ?Patient language and need for interpreter reviewed:: Yes ?Do you feel safe going back to the place where you live?: Yes      ?Need for Family Participation in Patient Care: Yes (Comment) ?Care giver support system in place?: Yes (comment) (daughters) ?Current home services: DME (walker) ?Criminal Activity/Legal Involvement Pertinent to Current Situation/Hospitalization: No - Comment as needed ? ?Activities of Daily Living ?Home Assistive Devices/Equipment: Chana Bode (specify type) ?ADL Screening (condition at time of admission) ?Patient's cognitive ability adequate to safely complete daily activities?: Yes ?Is the patient deaf or have difficulty hearing?: No ?Does the patient have difficulty seeing, even when wearing glasses/contacts?: No ?Does the patient have difficulty concentrating, remembering, or making decisions?: No ?Patient able to express need for assistance with ADLs?: Yes ?Does the patient have difficulty dressing or bathing?: Yes ?Independently performs ADLs?: No ?Communication: Independent ?Dressing (OT): Needs assistance ?Is this a change from baseline?: Change from baseline, expected to last <3days ?Grooming: Needs assistance ?Is this a change from baseline?: Pre-admission baseline ?Feeding: Independent ?Bathing: Needs assistance ?Is this a change from baseline?: Pre-admission baseline ?Toileting: Needs assistance ?Is this a change from baseline?: Change from baseline, expected to last <3 days ?In/Out Bed: Needs assistance ?Is this a change from baseline?: Pre-admission baseline ?Walks in Home: Needs  assistance ?Is this a change from baseline?: Pre-admission baseline ?Does the patient have difficulty walking or climbing stairs?: Yes ?Weakness of Legs: Both ?Weakness of Arms/Hands: Both ? ?Permission Sought/Granted ?Permission sought to share information with : Case Manager, Family Supports ?Permission granted to share information with : Yes, Verbal  Permission Granted ? Share Information with NAME: Lesleigh Noe Dukes ? Permission granted to share info w AGENCY: Home health agency ? Permission granted to share info w Relationship: daughter ? Permission granted to share info w Contact Information: [phone] ? ?Emotional Assessment ?Appearance:: Appears older than stated age ?Attitude/Demeanor/Rapport: Engaged ?Affect (typically observed): Accepting ?Orientation: : Oriented to Self, Oriented to Place, Oriented to  Time, Oriented to Situation ?Alcohol / Substance Use: Not Applicable ?Psych Involvement: No (comment) ? ?Admission diagnosis:  Dehydration [E86.0] ?SIRS (systemic inflammatory response syndrome) (HCC) [R65.10] ?Severe sepsis ([place]) [A41.9, R65.20] ?Malignant neoplasm of colon, unspecified part of colon ([place]) [C18.9] ?Sepsis with acute renal failure and tubular necrosis without septic shock, due to unspecified organism ([place]) [A41.9, R65.20, N17.0] ?Patient Active Problem List  ? Diagnosis Date Noted  ? Elevated d-dimer 04/02/2021  ? Severe sepsis ([place]) 04/01/2021  ? Acute renal failure superimposed on stage 3b chronic kidney disease ([place]) 04/01/2021  ? Hyponatremia 04/01/2021  ? 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 Acute respiratory failure with hypoxia ([place]) 04/01/2021  ? Malnutrition of moderate degree 10/22/2020  ? Abscess, gluteal, left 10/21/2020  ? Iron deficiency anemia   ? GERD (gastroesophageal reflux disease)   ? CKD (chronic kidney disease), stage IIIb   ? Small bowel obstruction ([place]) 04/29/2020  ? RA (rheumatoid arthritis) ([place])   ? Essential hypertension   ? CKD (chronic kidney disease) stage 3, GFR 30-59 ml/min (HCC)   ? History of colon cancer   ? ?PCP:  Annice Needy, MD ?Pharmacy:   ?[facility] [place], [place] AT [facility] OF [address] ?[place] ?[place] [address] ?Phone: [phone] Fax: [phone] ? ? ? ? ?Social Determinants of Health (SDOH) Interventions ?  ? ?Readmission Risk  Interventions ?   ? View : No data to display.  ?  ?  ?  ? ? ? ?

## 2021-04-02 NOTE — Assessment & Plan Note (Signed)
Elevated D-dimer-patient has active malignancy.  VQ scan was done in ED which was negative for PE.  Venous Doppler studies were negative for DVT. ?Patient also had worsening thrombocytopenia today. ?Concern of DIC ?-Check fibrinogen ?

## 2021-04-02 NOTE — Assessment & Plan Note (Signed)
Patient was on low-dose steroid at home.  She was started on stress doses. ?-Decrease steroids to prednisone 50 mg daily today, plan is to quickly taper to her baseline dose. ?

## 2021-04-02 NOTE — Consult Note (Addendum)
? ?                                                                                ?Consultation Note ?Date: 04/02/2021  ? ?Patient Name: Tanya Osborne  ?DOB: 06-Oct-1960  MRN: [id number]  Age / Sex: 61 y.o., female  ?PCP: Annice Needy, MD ?Referring Physician: Lorella Nimrod, MD ? ?Reason for Consultation:  goals of care ? ?HPI/Patient Profile: 61 y.o. female  with past medical history of stage IV colon cancer- (omental mets) s/p colostomy, chemotherapy- reached maximal benefits of chemotherapy per [facility] note last scan showed significant progression- being followed by [facility] medicine- per their notes Hospice has been discussed but patient has not acquiesced. She has received ongoing IV fluids for dehydration management. She is now admitted on 04/01/2021 with initial complaints of SOB. She was hypoxic and hypotensive- these have resolved with 2L [place] and IV fluids. Workup reveals SIRS- infectious etiology unclear. CT abdomen concerning for SBO from peritoneal mets- possible infectious collection of fluid in the small bowel, also concerns for R lung nodules ? Infection or aspiration. Cr is elevated and she has hydronephrosis on CT scan from obstruction by R adnexal mass. Procalcitonin has almost quadrupled overnight. Palliative medicine consulted for [place].  ? ?Primary Decision Maker ?PATIENT ? ?Discussion: ?Chart reviewed including labs, imaging, progress notes this hospitalization and from Care Everywhere.  ?I attempted to call her Palliative PA at [facility] but was not able to reach her.  ?Per notes- patient's prognosis of likely months was discussed by her Oncologist with her and her daughter in late February. Patient has continued to decline Hospice care. There also appears to be some social issues where patient has not shared her situation with her extended family members. Her daughters are aware. ?Met with Tanya Osborne. She was awake and alert and oriented.  She lives at home with her  daughter.  She is mostly independent for ADLs but does occasionally require assistance.  ?She enjoys spending time with her daughters, her grandchildren, and her granddogs.  She previously worked as an Field seismologist, she had her event Psychiatric nurse company.  She enjoys crafting and showed me how to make a beautiful bow.  ?She shares that she believes in miracles.  She notes she has seen God heal people with cancer and that is what she has asked for.  She says she does not like to dwell on discussing her illness. She wishes she could just get strong enough to get back to decorating and doing what she enjoys.  ?I met later with Tanya Osborne and her two daughters- Optician, dispensing and [place]. Patient lives with Tanya Osborne currently. ?I reviewed CT scan results and patient's current acute illness within the context of her terminal stage IV cancer. Tanya Osborne and Tanya Osborne are both understanding of the difficult situation and understand that their mom's condition is not likely to improve and is more likely to continue to worsen in the long term- even if we are able to address her current acute illness.  ?We discussed how the cancer is causing her acute renal injury and is contributing to her small bowel issues, her nausea, her lack of appetite,  and now this abdominal abscess.  ?Tanya Osborne is steadfast in her desire to continue all life prolonging options- believing that a miracle healing by God will occur.  ?Discussion was had regarding Tanya Osborne wishes about communicating her illness to other people. The withholding of information is proving to cause added stress to her daughters who can't look to other family members for support. Tanya Osborne feels that if family and her friends are informed that it will make her situation more realistic and will take away her ability to focus on her prayers to God for healing.  ?We did also briefly discuss Hospice, but patient is not ready to accept hospice at this point- although I do believe  it would be beneficial for care support for the patient and for her family.  ? ?  ? ?SUMMARY OF RECOMMENDATIONS ?-Continue full scope- patient is accepting of all life prolonging care that is offered- she is not likely to set any limits herself- what is more likely to happen is that a time will come when there are no other options and I believe she will be accepting when medical providers tell her this ?-We discussed the importance of continued conversation with medical providers when making decisions regarding medical treatments- her daughters are very well informed and strong advocates for their mother regarding her care and should be involved in all treatment decisions  ?-I did not address code status as patient was stressed by our prior conversation- recommend this be addressed in the future- but her medical treatment decisions are more urgent/relevant at this point - I would recommend a DNR due to the fact that if patient experiences cardiac and respiratory arrest and dies it is likely due to deterioration from her chronic, terminal illness rather than a process that can be reversed. I worry that CPR would cause more damage to her than it would help her.  ?-She should continue to follow with her outpatient Palliative provider at discharge or consider transitioning to a Community Palliative program that will visit her at home ? ? ?Code Status/Advance Care Planning: ?Full code ? ? ?Prognosis:   ?Unable to determine ? ?Discharge Planning: To Be Determined ? ?Primary Diagnoses: ?Present on Admission: ? RA (rheumatoid arthritis) ([place]) ? History of colon cancer ? Essential hypertension ? Malnutrition of moderate degree ? Acute renal failure superimposed on stage 3b chronic kidney disease ([place]) ? Hyponatremia ? SIRS (systemic inflammatory response syndrome) (HCC) ? Acute respiratory failure with hypoxia ([place]) ? ? ?Review of Systems  ?Constitutional:  Positive for appetite change and unexpected weight change.   ?Respiratory:  Negative for shortness of breath.   ?Gastrointestinal:  Positive for nausea.  ? ?Physical Exam ?Vitals and nursing note reviewed.  ? ? ?Vital Signs: BP (!) 127/93   Pulse (!) 103   Temp 98.3 ?F (36.8 ?C)   Resp (!) 22   Ht _0  (1.651 m)   Wt 62 kg   SpO2 99%   BMI 22.75 kg/m?  ?Pain Scale: 0-10 ?  ?Pain Score: 2  ? ? ?SpO2: SpO2: 99 % ?O2 Device:SpO2: 99 % ?O2 Flow Rate: .O2 Flow Rate (L/min): 2 L/min ? ?IO: Intake/output summary:  ?Intake/Output Summary (Last 24 hours) at 04/02/2021 1240 ?Last data filed at 04/02/2021 1044 ?Gross per 24 hour  ?Intake 580 ml  ?Output 225 ml  ?Net 355 ml  ? ? ?LBM: Last BM Date :  (colostomy) ?Baseline Weight: Weight: 62 kg ?Most recent weight: Weight: 62 kg     ?  P ? ? ?Thank you for this consult. Palliative medicine will continue to follow and assist as needed.  ? ?Time Total: 180 minutes ?Greater than 50%  of this time was spent counseling and coordinating care related to the above assessment and plan. ? ?Signed by: ?Mariana Kaufman, AGNP-C ?Palliative Medicine ? ?  ?Please contact Palliative Medicine Team phone at [phone] for questions and concerns.  ?For individual provider: See Amion ? ? ? ? ? ? ? ? ? ? ? ? ? ? ?

## 2021-04-02 NOTE — Assessment & Plan Note (Signed)
Estimated body mass index is 22.75 kg/m? as calculated from the following: ?  Height as of this encounter: '5\' 5"'$  (1.651 m). ?  Weight as of this encounter: 62 kg.  ? ?-Dietitian consult ?

## 2021-04-02 NOTE — Progress Notes (Signed)
Initial Nutrition Assessment ? ?DOCUMENTATION CODES:  ? ?Severe malnutrition in context of chronic illness ? ?INTERVENTION:  ? ?Glucerna Shake po TID, each supplement provides 220 kcal and 10 grams of protein ? ?MVI po daily  ? ?Pt at high refeed risk; recommend monitor potassium, magnesium and phosphorus labs daily until stable ? ?Check vitamin D and B12 labs  ? ?NUTRITION DIAGNOSIS:  ? ?Severe Malnutrition related to cancer and cancer related treatments as evidenced by moderate fat depletion, moderate muscle depletion, 21 percent weight loss in 1 year. ? ?GOAL:  ? ?Patient will meet greater than or equal to 90% of their needs ? ?MONITOR:  ? ?PO intake, Supplement acceptance, Labs, Weight trends, Skin, I & O's ? ?REASON FOR ASSESSMENT:  ? ?Consult ?Assessment of nutrition requirement/status ? ?ASSESSMENT:  ? ?61 y/o female with h/o RA, HTN, HLD, GERD, CKD III, depression, H. 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 ? ?Met with pt in room today. Pt reports poor appetite and oral intake pta. Pt reports intermittent nausea and vomiting at baseline. Pt does report that she drinks vanilla Glucerna at home. Pt with h/o multiple abdominal surgeries and has required TPN in the past. Pt with h/o SBO thought to be r/t carcinomatosis that resolved with medical management. Pt reports that she is hungry and would like to eat but at time of RD visit was awaiting CT scan. CT scan reporting small bowel obstruction secondary to worsening peritoneal carcinomatosis. Pt reports a significant amount of weight  loss pta. Per chart, pt is down 35lbs(21%) over the past year; this is significant. Pt's UBW appears to be ~170-180lbs. RD will add supplements and vitamins to help pt meet her estimated needs. Pt is at refeed risk. Will check vitamin D and B12 labs as pt's previous surgery appears to have involved her terminal ileum.  ? ?Medications reviewed and include: D3, protonix, prednisone, LRS '@100ml' /hr ? ?Labs reviewed: K 4.4 wnl, BUN 56(H), creat 2.65(H) ?Wbc- 25.3(H), Hgb 10.7(L), Hct 32.0(L) ? ?NUTRITION - FOCUSED PHYSICAL EXAM: ? ?Flowsheet Row Most Recent Value  ?Orbital Region Mild depletion  ?Upper Arm Region Severe depletion  ?Thoracic and Lumbar Region Moderate depletion  ?Buccal Region Mild depletion  ?Temple Region Moderate depletion  ?Clavicle Bone Region Severe depletion  ?Clavicle and Acromion Bone Region Severe depletion  ?Scapular Bone Region Moderate depletion  ?Dorsal Hand Moderate depletion  ?Patellar Region Moderate depletion  ?Anterior Thigh Region Moderate depletion  ?Posterior Calf Region Moderate depletion  ?Edema (RD Assessment) None  ?Hair Reviewed  ?Eyes Reviewed  ?Mouth Reviewed  ?Skin Reviewed  ?Nails Reviewed  ? ?Diet Order:   ?Diet Order   ? ?       ?  Diet regular Room service appropriate? Yes; Fluid consistency: Thin  Diet effective now       ?  ? ?  ?  ? ?  ? ?EDUCATION NEEDS:  ? ?Education needs have been addressed ? ?Skin:  Skin Assessment: Reviewed RN Assessment ? ?Last BM:  PTA ? ?Height:  ? ?Ht Readings from Last 1 Encounters:  ?04/01/21 '5\' 5"'  (1.651 m)  ? ? ?Weight:  ? ?Wt Readings from Last  1 Encounters:  ?04/01/21 62 kg  ? ? ?Ideal Body Weight:  56.8 kg ? ?BMI:  Body mass index is 22.75 kg/m?. ? ?Estimated Nutritional Needs:  ? ?Kcal:  1600-1800kcal/day ? ?Protein:  80-90g/day ? ?Fluid:  1.7-2.0L/day ? ?Koleen Distance MS, RD, LDN ?Please refer to AMION for RD and/or RD on-call/weekend/after hours pager ? ?

## 2021-04-02 NOTE — Final Progress Note (Signed)
Per Dr. Reesa Chew patient can go down to CT without nursing.  ?

## 2021-04-02 NOTE — Assessment & Plan Note (Signed)
Repeat CT abdomen with disease progression. ?Patient was unable to tolerate chemo. ?-Palliative care consult ?

## 2021-04-02 NOTE — Hospital Course (Addendum)
Taken from H&P. ? ?Tanya Osborne is a 61 y.o. female with medical history significant of rheumatoid arthritis on prednisone chronically, hypertension, hyperlipidemia, GERD, primary sclerosing cholangitis, CKD-3B, anemia, perforated duodenal ulcer, autoimmune liver disease, small bowel obstruction, colon cancer (s/p colectomy and end ileostomy at [facility] in February of 2020. Currently not chemotherapy, seen by palliative care), who presents with SOB. ?She was found to be hypoxic at 75% on room air and was placed on 2 L of oxygen via nasal cannula with improvement to 96%.  No baseline oxygen use.  Patient has mild cough with clear mucus, no chest pain, fever or chills.  Patient has chronic intermittent nausea and vomiting which has not been changed.  No diarrhea or abdominal pain.  She denies any urinary symptoms.  No history of any wound. ? ?On arrival she met sepsis criteria with leukocytosis, tachycardia and tachypnea. ?Received antibiotics with cefepime, vancomycin and Flagyl. ?Blood culture came back positive for Klebsiella pneumonia, procalcitonin elevated at 14.05 which increased to 51.96 today.  Urine cultures pending but UA does not look infected. ?For concern of intra-abdominal infection-CT abdomen was obtained which shows a lot of disease progression, causing hydronephrosis and also a concern of fluid collection/abscess. ?Please see full CT report. ?Initially antibiotics switched to ceftriaxone after getting positive blood cultures and Flagyl was added for concern of intra-abdominal infection. ? ?Palliative care was also consulted.  Patient does not want any surgical procedure. ?Palliative to discuss with them regarding percutaneous drain placement if needed. ?She was also having elevated D-dimer and platelets dropped today to 86, fibrinogen levels were also elevated which were not consistent with DIC. ? ?Patient has very guarded prognosis. ? ?3/24; patient and family initially wants to try every possible  medical intervention and also requesting surgery consult, surgery was consulted and she is not a candidate for any surgical intervention. ?She was also evaluated by IR, on reviewing the CT scan she does not have enough collection to put a drain in.  They are not very concerned that it is an abscess, most likely small amount of ascites. ?They were recommending monitoring only at this time. ? ?Repeat blood cultures done on 04/03/2021 are negative so far. ?Otherwise remained clinically stable. ? ?Patient remained stable and afebrile.  Patient has disease progression and high risk for intestinal obstruction due to the disease burden.  She is not a candidate for continuation of chemotherapy. ?Palliative care was also consulted.  We consulted home hospice services as she needs some help at home.  She was discharged home with home hospice for further symptom management. ?Patient received ceftriaxone and Flagyl while in the hospital and discharged home on Cipro and Flagyl for 1 more week for concern of intra-abdominal source of infection.  No obvious source identified at this time. ? ?Patient will continue with her home medications, no need for steroid taper and she can resume her home dose on discharge. ? ? ?

## 2021-04-02 NOTE — Progress Notes (Signed)
?Progress Note ? ? ?Patient: Tanya Osborne DOB: [birth year] DOA: 04/01/2021     1 ?DOS: the patient was seen and examined on 04/02/2021 ?  ?Brief hospital course: ?Taken from H&P. ? ?Tanya Osborne is a 61 y.o. female with medical history significant of rheumatoid arthritis on prednisone chronically, hypertension, hyperlipidemia, GERD, primary sclerosing cholangitis, CKD-3B, anemia, perforated duodenal ulcer, autoimmune liver disease, small bowel obstruction, colon cancer (s/p colectomy and end ileostomy at [facility] in February of 2020. Currently not chemotherapy, seen by palliative care), who presents with SOB. ?She was found to be hypoxic at 75% on room air and was placed on 2 L of oxygen via nasal cannula with improvement to 96%.  No baseline oxygen use.  Patient has mild cough with clear mucus, no chest pain, fever or chills.  Patient has chronic intermittent nausea and vomiting which has not been changed.  No diarrhea or abdominal pain.  She denies any urinary symptoms.  No history of any wound. ? ?On arrival she met sepsis criteria with leukocytosis, tachycardia and tachypnea. ?Received antibiotics with cefepime, vancomycin and Flagyl. ?Blood culture came back positive for Klebsiella pneumonia, procalcitonin elevated at 14.05 which increased to 51.96 today.  Urine cultures pending but UA does not look infected. ?For concern of intra-abdominal infection-CT abdomen was obtained which shows a lot of disease progression, causing hydronephrosis and also a concern of fluid collection/abscess. ?Please see full CT report. ?Initially antibiotics switched to ceftriaxone after getting positive blood cultures and now switched to Zosyn for concern of intra-abdominal infection. ? ?Palliative care was also consulted.  Patient does not want any surgical procedure. ?Palliative to discuss with them regarding percutaneous drain placement if needed. ?She was also having elevated D-dimer and platelets dropped today to  86, concern of DIC, ordered fibrinogen. ? ?Patient has very guarded prognosis. ? ? ? ? ?Assessment and Plan: ?* Severe sepsis ([place]) ?Patient met severe sepsis criteria with leukocytosis, tachycardia, tachypnea, lactic acidosis and AKI.  Blood cultures growing Klebsiella pneumonia, urine cultures pending.  CT abdomen was obtained for concern of intra-abdominal process which shows quite impressive disease progression and a concern of intra-abdominal abscess.  Palliative care is also on board and having a family meeting to discuss goals of care.  Patient does not want more surgeries. ?-Switch antibiotic to Zosyn ?-We will consult surgery or IR-pending goals of care discussion. ?-Continue with supportive care ? ?Hyponatremia ?Most likely secondary to poor p.o. intake.  Sodium improving with IV fluid. ?-Continue to monitor ? ?Acute respiratory failure with hypoxia ([place]) ?Resolved-patient is now on room air ?Patient requiring up to 2 L of oxygen as she was desaturating in 70s on arrival.  No prior oxygen use.  Most likely secondary to sepsis. ? ?Acute renal failure superimposed on stage 3b chronic kidney disease ([place]) ?Worsening renal function most likely secondary to poor p.o. intake as improving with IV fluid. ?-Continue with IV fluid-we will switch to LR due to some decrease in bicarb. ?-Monitor renal function ?-Avoid nephrotoxins ? ?History of colon cancer ?Repeat CT abdomen with disease progression. ?Patient was unable to tolerate chemo. ?-Palliative care consult ? ?RA (rheumatoid arthritis) ([place]) ?Patient was on low-dose steroid at home.  She was started on stress doses. ?-Decrease steroids to prednisone 50 mg daily today, plan is to quickly taper to her baseline dose. ? ?Essential hypertension ?Blood pressure soft on admission, now within lower normal goal ?-Keep holding antihypertensives ? ?Malnutrition of moderate degree ?Estimated body mass index is 22.75 kg/m? as calculated  from the following: ?  Height as of  this encounter: _0  (1.651 m). ?  Weight as of this encounter: 62 kg.  ? ?-Dietitian consult ? ?Elevated d-dimer ?Elevated D-dimer-patient has active malignancy.  VQ scan was done in ED which was negative for PE.  Venous Doppler studies were negative for DVT. ?Patient also had worsening thrombocytopenia today. ?Concern of DIC ?-Check fibrinogen ? ?  ? ?Subjective: Patient was seen and examined today.  She is a very cachectic lady, denies any abdominal pain, nausea or vomiting.  Denies any urinary symptoms. ? ?Physical Exam: ?Vitals:  ? 04/02/21 1100 04/02/21 1200 04/02/21 1300 04/02/21 1400  ?BP: (!) 111/97 114/88 109/73 108/64  ?Pulse: 93 99 96 99  ?Resp: 14 15 (!) 21 20  ?Temp:  98.5 ?F (36.9 ?C)    ?TempSrc:      ?SpO2: 100% 100% 100% 100%  ?Weight:      ?Height:      ? ?General.  Cachectic lady, in no acute distress. ?Pulmonary.  Lungs clear bilaterally, normal respiratory effort. ?CV.  Regular rate and rhythm, no JVD, rub or murmur. ?Abdomen.  Soft, nontender, nondistended, BS positive. ?CNS.  Alert and oriented .  No focal neurologic deficit. ?Extremities.  No edema, no cyanosis, pulses intact and symmetrical. ?Psychiatry.  Judgment and insight appears normal. ? ?Data Reviewed: ?Prior notes, labs and images reviewed ? ?Family Communication: Discussed with daughter on phone. ? ?Disposition: ?Status is: Inpatient ?Remains inpatient appropriate because: Severity of illness ? ? Planned Discharge Destination: To be determined ? ?DVT prophylaxis.  SCDs-thrombocytopenia ? ?Time spent: 50 minutes ? ?This record has been created using Systems analyst. Errors have been sought and corrected,but may not always be located. Such creation errors do not reflect on the standard of care. ? ?Author: ?Lorella Nimrod, MD ?04/02/2021 3:09 PM ? ?For on call review [email or web address].  ?

## 2021-04-02 NOTE — Assessment & Plan Note (Signed)
Worsening renal function most likely secondary to poor p.o. intake as improving with IV fluid. ?-Continue with IV fluid-we will switch to LR due to some decrease in bicarb. ?-Monitor renal function ?-Avoid nephrotoxins ?

## 2021-04-03 DIAGNOSIS — C189 Malignant neoplasm of colon, unspecified: Secondary | ICD-10-CM

## 2021-04-03 DIAGNOSIS — E43 Unspecified severe protein-calorie malnutrition: Secondary | ICD-10-CM | POA: Diagnosis present

## 2021-04-03 LAB — RESPIRATORY PANEL BY PCR

## 2021-04-03 LAB — RENAL FUNCTION PANEL
Albumin: 2.2 g/dL — ABNORMAL LOW (ref 3.5–5.0)
Anion gap: 7 (ref 5–15)
BUN: 47 mg/dL — ABNORMAL HIGH (ref 8–23)
CO2: 23 mmol/L (ref 22–32)
Calcium: 8 mg/dL — ABNORMAL LOW (ref 8.9–10.3)
Chloride: 107 mmol/L (ref 98–111)
Creatinine, Ser: 2.11 mg/dL — ABNORMAL HIGH (ref 0.44–1.00)
GFR, Estimated: 26 mL/min — ABNORMAL LOW (ref >60)
Glucose, Bld: 113 mg/dL — ABNORMAL HIGH (ref 70–99)
Phosphorus: 2.7 mg/dL (ref 2.5–4.6)
Potassium: 3.9 mmol/L (ref 3.5–5.1)
Sodium: 137 mmol/L (ref 135–145)

## 2021-04-03 LAB — CBC WITH DIFFERENTIAL/PLATELET
Abs Immature Granulocytes: 0.13 10*3/uL — ABNORMAL HIGH (ref 0.00–0.07)
Basophils Absolute: 0 10*3/uL (ref 0.0–0.1)
Basophils Relative: 0 %
Eosinophils Absolute: 0 10*3/uL (ref 0.0–0.5)
Eosinophils Relative: 0 %
HCT: 29.7 % — ABNORMAL LOW (ref 36.0–46.0)
Hemoglobin: 9.9 g/dL — ABNORMAL LOW (ref 12.0–15.0)
Immature Granulocytes: 1 %
Lymphocytes Relative: 2 %
Lymphs Abs: 0.4 10*3/uL — ABNORMAL LOW (ref 0.7–4.0)
MCH: 29.1 pg (ref 26.0–34.0)
MCHC: 33.3 g/dL (ref 30.0–36.0)
MCV: 87.4 fL (ref 80.0–100.0)
Monocytes Absolute: 1.1 10*3/uL — ABNORMAL HIGH (ref 0.1–1.0)
Monocytes Relative: 7 %
Neutro Abs: 15.5 10*3/uL — ABNORMAL HIGH (ref 1.7–7.7)
Neutrophils Relative %: 90 %
Platelets: 71 10*3/uL — ABNORMAL LOW (ref 150–400)
RBC: 3.4 MIL/uL — ABNORMAL LOW (ref 3.87–5.11)
RDW: 14.7 % (ref 11.5–15.5)
WBC: 17.1 10*3/uL — ABNORMAL HIGH (ref 4.0–10.5)
nRBC: 0 % (ref 0.0–0.2)

## 2021-04-03 LAB — VITAMIN D 25 HYDROXY (VIT D DEFICIENCY, FRACTURES): Vit D, 25-Hydroxy: 62.28 ng/mL (ref 30–100)

## 2021-04-03 LAB — VITAMIN B12: Vitamin B-12: 1777 pg/mL — ABNORMAL HIGH (ref 180–914)

## 2021-04-03 LAB — PROCALCITONIN: Procalcitonin: 34.1 ng/mL

## 2021-04-03 MED ORDER — CHLORHEXIDINE GLUCONATE CLOTH 2 % EX PADS
6.0000 | MEDICATED_PAD | Freq: Every day | CUTANEOUS | Status: DC
  Administered 2021-04-03 – 2021-04-04 (×2): 6 via TOPICAL

## 2021-04-03 MED ORDER — PREDNISONE 20 MG PO TABS
40.0000 mg | ORAL_TABLET | Freq: Every day | ORAL | Status: DC
  Administered 2021-04-03: 40 mg via ORAL

## 2021-04-03 NOTE — Assessment & Plan Note (Signed)
Worsening renal function most likely secondary to poor p.o. intake as improving with IV fluid. ?-Continue with IV fluid for 1 more day ?-Monitor renal function ?-Avoid nephrotoxins ?

## 2021-04-03 NOTE — Progress Notes (Signed)
?Progress Note ? ? ?Patient: Tanya Osborne GLO:[id number] DOB: 22-Sep-1960 DOA: 04/01/2021     2 ?DOS: the patient was seen and examined on 04/03/2021 ?  ?Brief hospital course: ?Taken from H&P. ? ?Tanya Osborne is a 61 y.o. female with medical history significant of rheumatoid arthritis on prednisone chronically, hypertension, hyperlipidemia, GERD, primary sclerosing cholangitis, CKD-3B, anemia, perforated duodenal ulcer, autoimmune liver disease, small bowel obstruction, colon cancer (s/p colectomy and end ileostomy at [facility] in February of 2020. Currently not chemotherapy, seen by palliative care), who presents with SOB. ?She was found to be hypoxic at 75% on room air and was placed on 2 L of oxygen via nasal cannula with improvement to 96%.  No baseline oxygen use.  Patient has mild cough with clear mucus, no chest pain, fever or chills.  Patient has chronic intermittent nausea and vomiting which has not been changed.  No diarrhea or abdominal pain.  She denies any urinary symptoms.  No history of any wound. ? ?On arrival she met sepsis criteria with leukocytosis, tachycardia and tachypnea. ?Received antibiotics with cefepime, vancomycin and Flagyl. ?Blood culture came back positive for Klebsiella pneumonia, procalcitonin elevated at 14.05 which increased to 51.96 today.  Urine cultures pending but UA does not look infected. ?For concern of intra-abdominal infection-CT abdomen was obtained which shows a lot of disease progression, causing hydronephrosis and also a concern of fluid collection/abscess. ?Please see full CT report. ?Initially antibiotics switched to ceftriaxone after getting positive blood cultures and now switched to Zosyn for concern of intra-abdominal infection. ? ?Palliative care was also consulted.  Patient does not want any surgical procedure. ?Palliative to discuss with them regarding percutaneous drain placement if needed. ?She was also having elevated D-dimer and platelets dropped today to  86, concern of DIC, ordered fibrinogen. ? ?Patient has very guarded prognosis. ? ?3/24; patient and family wants to try every possible medical intervention and also requesting surgery consult, surgery was consulted and she is not a candidate for any surgical intervention. ?She was also evaluated by IR, on reviewing the CT scan she does not have enough collection to put a drain in.  They are not very concerned that it is an abscess, most likely small amount of ascites. ?They were recommending monitoring only at this time. ? ?Otherwise remained clinically stable. ?We will repeat blood cultures today. ? ? ?Assessment and Plan: ?* Severe sepsis ([place]) ?Patient met severe sepsis criteria with leukocytosis, tachycardia, tachypnea, lactic acidosis and AKI.  Blood cultures growing Klebsiella pneumonia, urine cultures pending.  CT abdomen was obtained for concern of intra-abdominal process which shows quite impressive disease progression and a concern of intra-abdominal abscess.  General surgery was consulted and she is not a candidate for any surgical intervention.  IR was also consulted and she does not have enough fluid collection for drainage.  Per radiology less likely an abscess, can be a small amount of ascites which need monitoring. ?-Continue ceftriaxone and Flagyl ?-Repeat blood cultures ?-Continue with supportive care ?-PT/OT evaluation ? ?Hyponatremia ?Resolved. ?Most likely secondary to poor p.o. intake.  Sodium improving with IV fluid. ?-Continue to monitor ? ?Acute respiratory failure with hypoxia ([place]) ?Resolved-patient is now on room air ?Patient requiring up to 2 L of oxygen as she was desaturating in 70s on arrival.  No prior oxygen use.  Most likely secondary to sepsis. ? ?Acute renal failure superimposed on stage 3b chronic kidney disease ([place]) ?Worsening renal function most likely secondary to poor p.o. intake as improving with IV fluid. ?-  Continue with IV fluid for 1 more day ?-Monitor renal  function ?-Avoid nephrotoxins ? ?History of colon cancer ?Repeat CT abdomen with disease progression. ?Patient was unable to tolerate chemo. ?-Palliative care consult ? ?RA (rheumatoid arthritis) ([place]) ?Patient was on low-dose steroid at home.  She was started on stress doses. ?-Decrease steroids to prednisone 50 mg daily today, plan is to quickly taper to her baseline dose. ? ?Essential hypertension ?Blood pressure soft on admission, now within lower normal goal ?-Keep holding antihypertensives ? ?Malnutrition of moderate degree ?Estimated body mass index is 22.75 kg/m? as calculated from the following: ?  Height as of this encounter: '5\' 5"'  (1.651 m). ?  Weight as of this encounter: 62 kg.  ? ?-Dietitian consult ? ?Elevated d-dimer ?Elevated D-dimer-patient has active malignancy.  VQ scan was done in ED which was negative for PE.  Venous Doppler studies were negative for DVT. ?Patient also had worsening thrombocytopenia today. ?Concern of DIC ?-Check fibrinogen ? ?  ? ?Subjective: Patient was seen and examined today.  No complaints.  2 daughters remained on phone during morning rounds.  Patient is not ready for hospice care, daughter seen understanding to some degree. ? ?Physical Exam: ?Vitals:  ? 04/03/21 0400 04/03/21 0500 04/03/21 0600 04/03/21 0800  ?BP: 112/78  (!) 121/95 95/65  ?Pulse: 91 94 90 77  ?Resp: 14 13 (!) 22 12  ?Temp:    97.8 ?F (36.6 ?C)  ?TempSrc:    Oral  ?SpO2: 100% 100% 99% 100%  ?Weight:      ?Height:      ? ?General.  Malnourished lady, in no acute distress. ?Pulmonary.  Lungs clear bilaterally, normal respiratory effort. ?CV.  Regular rate and rhythm, no JVD, rub or murmur. ?Abdomen.  Soft, nontender, nondistended, BS positive. ?CNS.  Alert and oriented .  No focal neurologic deficit. ?Extremities.  No edema, no cyanosis, pulses intact and symmetrical. ?Psychiatry.  Judgment and insight appears normal. ? ?Data Reviewed: ?Prior notes and labs reviewed. ? ?Family Communication: Discussed with  2 daughters on phone. ? ?Disposition: ?Status is: Inpatient ?Remains inpatient appropriate because: Severity of illness ? ? Planned Discharge Destination: Home with Home Health ? ? ?Time spent: 45 minutes ? ?This record has been created using Systems analyst. Errors have been sought and corrected,but may not always be located. Such creation errors do not reflect on the standard of care. ? ?Author: ?Lorella Nimrod, MD ?04/03/2021 3:43 PM ? ?For on call review [email or web address].  ?

## 2021-04-03 NOTE — Progress Notes (Signed)
PT Cancellation Note ? ?Patient Details ?Name: Tanya Osborne ?MRN: [id number] ?DOB: 1960-06-12 ? ? ?Cancelled Treatment:    Reason Eval/Treat Not Completed: Other (comment).  PT consult received.  Chart reviewed.  Pt declined PT d/t reporting recently returning to bed and wanting to rest (and take a nap).  Will re-attempt PT evaluation at a later date/time. ? ?Leitha Bleak, PT ?04/03/21, 3:10 PM ? ?

## 2021-04-03 NOTE — Assessment & Plan Note (Addendum)
Patient met severe sepsis criteria with leukocytosis, tachycardia, tachypnea, lactic acidosis and AKI.  Blood cultures growing Klebsiella pneumonia, urine cultures pending.  CT abdomen was obtained for concern of intra-abdominal process which shows quite impressive disease progression and a concern of intra-abdominal abscess.  General surgery was consulted and she is not a candidate for any surgical intervention.  IR was also consulted and she does not have enough fluid collection for drainage.  Per radiology less likely an abscess, can be a small amount of ascites which need monitoring. ?-Continue ceftriaxone and Flagyl ?-Repeat blood cultures ?-Continue with supportive care ?-PT/OT evaluation ?

## 2021-04-03 NOTE — Assessment & Plan Note (Signed)
Estimated body mass index is 22.75 kg/m? as calculated from the following: ?  Height as of this encounter: '5\' 5"'$  (1.651 m). ?  Weight as of this encounter: 62 kg.  ? ?-Dietitian consult ?

## 2021-04-03 NOTE — Progress Notes (Signed)
IR asked to evaluate this patient for possible image-guided intra-abdominal fluid collection aspiration with possible drain placement. Imaging reviewed by Dr. Annamaria Boots and there is insufficient fluid for aspiration/drain placement. Dr. Annamaria Boots discussed this with the ordering MD.  ? ?The order will be deleted; no IR procedure planned. ? Soyla Dryer, AGACNP-BC ?[phone] ?04/03/2021, 10:07 AM ? ? ?

## 2021-04-03 NOTE — Consult Note (Addendum)
[facility] ?SURGICAL CONSULTATION NOTE (initial) - cpt: 30865 ? ? ?HISTORY OF PRESENT ILLNESS (HPI):  ?61 y.o. female presented to [facility] ED on 03/22 for evaluation of SOB. Patient's daughter's on the phone help contribute history. Reportedly, she was getting ready to go to [facility] for her scheduled oncology visits for hydration and symptom management and she was found to be hypoxic to the 70%. As such, daughter's called EMS. She was found to meet SIRS criteria in the ED and was admitted to step-down. 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 She has not been able to tolerate chemotherapy. She was admitted to [facility] in April for SBO which resolved with conservative management. She also underwent I&D of perirectal abscess in October 2022 with Dr Christian Mate as well. Patient's daughter's endorse that she does have intermittent nausea and emesis at home but ileostomy continues to function. She does have decreased appetite and PO intake as well, which is why she goes to [facility] periodically for IVF. Most recent labs revealed an improving leukocytosis to 17.1K (from 25.3K yesterday), renal function improving as well with sCr - 2.11 (from 2.65, was as high as 3.90), no significant electrolyte derangements.  CT Abdomen/Pelvis (03/23) was concerning for possible increase in LUQ intra-abdominal fluid, concerning for possible abscess. Known history of stage IV metastatic colon cancer and this could also represent ascites and progression of disease.  ? ?Surgery is consulted by hospitalist physician Dr. 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 ? ? ?PAST MEDICAL HISTORY (PMH):  ?Past Medical History:  ?Diagnosis Date  ? Autoimmune liver disease   ? Cancer [facility])   ? Depression   ?  Duodenal ulcer perforation ([place]) 09/08/2008  ? GERD (gastroesophageal reflux disease)   ? H. pylori infection 2010  ? Hypercholesterolemia 2018  ? Iron deficiency anemia   ? Kidney disease, chronic, stage IV (GFR 15-29 ml/min) (HCC)   ? 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 RA (rheumatoid arthritis) ([place])   ? Sepsis ([place])   ?  ? ?PAST SURGICAL HISTORY ([place]):  ?Past Surgical History:  ?Procedure Laterality Date  ? INCISION AND DRAINAGE ABSCESS Left 10/21/2020  ? Procedure: INCISION AND DRAINAGE ABSCESS;  Surgeon: Ronny Bacon, MD;  Location: [facility] ORS;  Service: General;  Laterality: Left;  ? PYLOROPLASTY  09/08/2008  ? with omental patch  ? TOTAL COLECTOMY  03/06/2018  ? total abdominal colectomy with end ileostomy  ? TUBAL LIGATION    ?  ? ?MEDICATIONS:  ?Prior to Admission medications   ?Medication Sig Start Date End Date Taking? Authorizing Provider  ?Cholecalciferol 50 MCG (2000 UT) CAPS Take 2,000 Units by mouth daily.   Yes [provider]  ?oxyCODONE (OXY IR/ROXICODONE) 5 MG immediate release tablet Take 5 mg by mouth every 6 (six) hours as needed for severe pain.   Yes [provider]  ?pantoprazole (PROTONIX) 40 MG tablet Take 40 mg by mouth daily. 04/09/20 04/09/21 Yes [provider]  ?predniSONE (DELTASONE) 5 MG tablet Take 5 mg by mouth daily. 09/22/20  Yes [provider]  ?clobetasol ointment (TEMOVATE) 7.84 % Apply 1 application topically 2 (two) times daily. 07/06/19   [provider]  ?[facility] SapuLPa Liver Oil 5000-500 UNIT/5ML OIL Take 1 capsule by mouth daily. ?Patient not taking: Reported on 04/01/2021  [provider]  ?dexamethasone (DECADRON) 4 MG tablet Take 4-8 mg by mouth daily.    [provider]  ?Ensure Max Protein (ENSURE MAX PROTEIN) LIQD Take 330 mLs (11 oz total) by mouth 2 (two) times daily. 10/29/20   Ezekiel Slocumb, DO  ?HYDROcodone-acetaminophen (NORCO/VICODIN) 5-325 MG tablet Take 1-2 tablets by mouth every 4 (four)  hours as needed for moderate pain or severe pain. ?Patient not taking: Reported on 04/01/2021 10/29/20 10/29/21  Nicole Kindred A, DO  ?lidocaine-prilocaine (EMLA) cream Apply 1 application topically once. 11/28/19   [provider]  ?Multiple Vitamin (MULTI-VITAMIN) tablet Take 1 tablet by mouth daily. ?Patient not taking: Reported on 10/21/2020    [provider]  ?  ? ?ALLERGIES:  ?Allergies  ?Allergen Reactions  ? Nsaids Swelling and Rash  ?  Other Reaction: OTHER REACTION-GI BLEED  ? Penicillins Swelling  ? Percocet [Oxycodone-Acetaminophen] Nausea And Vomiting  ? Celebrex [Celecoxib] Swelling  ? Cortisone Swelling  ? Gabapentin Swelling  ? Methotrexate Derivatives Swelling  ? Nabumetone Other (See Comments)  ? Tomato Rash  ?  Joint inflamation  ?  ? ?SOCIAL HISTORY:  ?Social History  ? ?Socioeconomic History  ? Marital status: Divorced  ?  Spouse name: Not on file  ? Number of children: Not on file  ? Years of education: Not on file  ? Highest education level: Not on file  ?Occupational History  ? Not on file  ?Tobacco Use  ? Smoking status: Never  ? Smokeless tobacco: Never  ?Substance and Sexual Activity  ? Alcohol use: Never  ? Drug use: Never  ? Sexual activity: Not on file  ?Other Topics Concern  ? Not on file  ?Social History Narrative  ? Not on file  ? ?Social Determinants of Health  ? ?Financial Resource Strain: Not on file  ?Food Insecurity: Not on file  ?Transportation Needs: Not on file  ?Physical Activity: Not on file  ?Stress: Not on file  ?Social Connections: Not on file  ?Intimate Partner Violence: Not on file  ?  ? ?FAMILY HISTORY:  ?Family History  ?Problem Relation Age of Onset  ? Heart disease Mother   ? Lung disease Mother   ?  ? ? ?REVIEW OF SYSTEMS:  ?Review of Systems  ?Constitutional:  Positive for weight loss. Negative for chills and fever.  ?HENT:  Negative for congestion and sore throat.   ?Respiratory:  Positive for shortness of breath (Resolved). Negative for  cough.   ?Cardiovascular:  Negative for chest pain and palpitations.  ?Gastrointestinal:  Positive for abdominal pain, nausea (Intermittent) and vomiting (Intermittent). Negative for blood in stool, constipation and diarrhea.  ?Genitourinary:  Negative for dysuria and urgency.  ?All other systems reviewed and are negative. ? ?VITAL SIGNS:  ?Temp:  [97.8 ?F (36.6 ?C)-98.5 ?F (36.9 ?C)] 98.4 ?F (36.9 ?C) (03/24 0000) ?Pulse Rate:  [90-103] 90 (03/24 0600) ?Resp:  [11-22] 22 (03/24 0600) ?BP: (90-129)/(64-97) 121/95 (03/24 0600) ?SpO2:  [99 %-100 %] 99 % (03/24 0600)     Height: '5\' 5"'$  (165.1 cm) Weight: 62 kg BMI (Calculated): 22.75  ? ?INTAKE/OUTPUT:  ?03/23 0701 - 03/24 0700 ?In: 1200 [I.V.:1200] ?Out: 1600 [Urine:1300; Stool:300] ? ?PHYSICAL EXAM:  ?Physical Exam ?Vitals and nursing note reviewed.  ?Constitutional:   ?   General: She is not in acute distress. ?   Appearance: She is well-developed. She is cachectic. She is not ill-appearing.  ?HENT:  ?   Head: Normocephalic and atraumatic.  ?Eyes:  ?  General: No scleral icterus. ?   Conjunctiva/sclera: Conjunctivae normal.  ?Cardiovascular:  ?   Rate and Rhythm: Normal rate and regular rhythm.  ?   Pulses: Normal pulses.  ?Pulmonary:  ?   Effort: Pulmonary effort is normal. No respiratory distress.  ?Abdominal:  ?   General: Abdomen is flat. The ostomy site is clean. There is no distension.  ?   Palpations: Abdomen is soft.  ?   Tenderness: There is no abdominal tenderness.  ?   Comments: Abdomen is soft, non-tender, non-distended, no rebound/guarding. Ileostomy in RLQ, pink, patent, liquid stool present  ?Genitourinary: ?   Comments: Deferred ?Musculoskeletal:  ?   Right lower leg: No edema.  ?   Left lower leg: No edema.  ?Skin: ?   General: Skin is warm and dry.  ?   Coloration: Skin is not jaundiced.  ?   Findings: No erythema.  ?Neurological:  ?   General: No focal deficit present.  ?   Mental Status: She is alert and oriented to person, place, and time.   ?Psychiatric:     ?   Mood and Affect: Mood normal.     ?   Behavior: Behavior normal.  ? ? ? ?Labs:  ? ?  Latest Ref Rng & Units 04/03/2021  ?  4:51 AM 04/02/2021  ?  6:45 AM 04/01/2021  ?  7:41 AM  ?CBC  ?WBC 4

## 2021-04-03 NOTE — Assessment & Plan Note (Signed)
Resolved-patient is now on room air ?Patient requiring up to 2 L of oxygen as she was desaturating in 70s on arrival.  No prior oxygen use.  Most likely secondary to sepsis. ?

## 2021-04-03 NOTE — Assessment & Plan Note (Signed)
Blood pressure soft on admission, now within lower normal goal ?-Keep holding antihypertensives ?

## 2021-04-03 NOTE — Assessment & Plan Note (Signed)
Resolved. ?Most likely secondary to poor p.o. intake.  Sodium improving with IV fluid. ?-Continue to monitor ?

## 2021-04-03 NOTE — Evaluation (Signed)
Occupational Therapy Evaluation ?Patient Details ?Name: Tanya Osborne ?MRN: [id number] ?DOB: [birth year] ?Today's Date: 04/03/2021 ? ? ?History of Present Illness Tanya Osborne is a 61 y.o. female with medical history significant of rheumatoid arthritis on prednisone chronically, hypertension, hyperlipidemia, GERD, primary sclerosing cholangitis, CKD-3B, anemia, perforated duodenal ulcer, autoimmune liver disease, small bowel obstruction, colon cancer (s/p colectomy and end ileostomy at [facility] in February of 2020. Currently not chemotherapy, seen by palliative care), who presents with SOB.  ? ?Clinical Impression ?  ?Tanya Osborne was seen for OT evaluation this date. Pt received, semi-supine in bed, endorsing fatigue and distress over the recent loss of her mother. RN aware. Prior to hospital admission, pt was generally independent with ADL Management. She reports living with her daughter in a 1-level home with a ramped entrance. Per pt her family assists her with her colostomy management, and IADL tasks including cooking/meal prep. Pt endorses strong desire to regain more functional independence. Pt is functionally limited by generalized weakness & decreased activity tolerance. She performs bed/functional mobility, LB dressing, and toileting at [facility] with SUPERVISION for safety this date. Pt would benefit from skilled OT services to address noted impairments and functional limitations (see below for any additional details) in order to maximize safety and independence while minimizing falls risk and caregiver burden. Upon hospital discharge, recommend HHOT to maximize pt safety and return to functional independence during meaningful occupations of daily life.  ?  ?   ? ?Recommendations for follow up therapy are one component of a multi-disciplinary discharge planning process, led by the attending physician.  Recommendations may be updated based on patient status, additional functional criteria and insurance  authorization.  ? ?Follow Up Recommendations ? Home health OT  ?  ?Assistance Recommended at Discharge Intermittent Supervision/Assistance  ?Patient can return home with the following A little help with walking and/or transfers;A little help with bathing/dressing/bathroom;Assistance with cooking/housework;Assist for transportation;Help with stairs or ramp for entrance ? ?  ?Functional Status Assessment ? Patient has had a recent decline in their functional status and demonstrates the ability to make significant improvements in function in a reasonable and predictable amount of time.  ?Equipment Recommendations ? None recommended by OT (Pt has necessary equipment)  ?  ?Recommendations for Other Services   ? ? ?  ?Precautions / Restrictions Precautions ?Precautions: Fall ?Restrictions ?Weight Bearing Restrictions: No  ? ?  ? ?Mobility Bed Mobility ?Overal bed mobility: Needs Assistance ?Bed Mobility: Supine to Sit, Sit to Supine ?  ?  ?Supine to sit: Supervision, HOB elevated ?Sit to supine: HOB elevated, Supervision ?  ?  ?  ? ?Transfers ?Overall transfer level: Needs assistance ?Equipment used: None ?Transfers: Sit to/from Stand, Bed to chair/wheelchair/BSC ?Sit to Stand: Supervision ?Stand pivot transfers: Supervision ?  ?  ?  ?  ?  ?  ? ?  ?Balance Overall balance assessment: Needs assistance ?Sitting-balance support: Feet supported, No upper extremity supported ?Sitting balance-Leahy Scale: Good ?Sitting balance - Comments: Steady static sitting, reaching within BOS. ?  ?Standing balance support: No upper extremity supported, During functional activity, Single extremity supported ?Standing balance-Leahy Scale: Fair ?Standing balance comment: Tends to maintain at least 1 UE support on stable surface when performing functional transfers. Adamantly declines use of AE this date. States using a walker makes her feel like she's "giving up" ?  ?  ?  ?  ?  ?  ?  ?  ?  ?  ?  ?   ? ?ADL either performed or assessed  with  clinical judgement  ? ?ADL Overall ADL's : Needs assistance/impaired ?  ?  ?  ?  ?  ?  ?  ?  ?  ?  ?  ?  ?  ?  ?  ?  ?  ?  ?  ?General ADL Comments: Pt is functionally limited by generalized weakness & decreased activity tolerance. She reports being functionally limited at baseline, but is eager to improve her functional independence with ADL/IADL management. She performs bed/functional mobility, LB dressing, and toileting at [facility] with SUPERVISION for safety this date.  ? ? ? ?Vision Patient Visual Report: No change from baseline ?   ?   ?Perception   ?  ?Praxis   ?  ? ?Pertinent Vitals/Pain Pain Assessment ?Pain Assessment: No/denies pain  ? ? ? ?Hand Dominance Right ?  ?Extremity/Trunk Assessment Upper Extremity Assessment ?Upper Extremity Assessment: Generalized weakness (Multiple arthritic changes appreciated in B hands. Grip strength/FMC dininished grossly 3/5 to.) ?  ?Lower Extremity Assessment ?Lower Extremity Assessment: Generalized weakness ?  ?  ?  ?Communication Communication ?Communication: No difficulties ?  ?Cognition Arousal/Alertness: Awake/alert ?Behavior During Therapy: Flat affect ?Overall Cognitive Status: Within Functional Limits for tasks assessed ?  ?  ?  ?  ?  ?  ?  ?  ?  ?  ?  ?  ?  ?  ?  ?  ?General Comments: Pt is A&O x4, generally flat t/o session, but voices strong desire "to have a full recovery". ?  ?  ?General Comments    ? ?  ?Exercises Other Exercises ?Other Exercises: Pt educated on role of OT in acute setting, falls prevention, safe use of AE/DME for ADL management, DC recs and routines modifications to support safety and functional independece during ADL management. ?  ?Shoulder Instructions    ? ? ?Home Living Family/patient expects to be discharged to:: Private residence ?Living Arrangements: Children ?Available Help at Discharge: Family;Available 24 hours/day ?Type of Home: House ?Home Access: Level entry ?  ?  ?Home Layout: One level ?  ?  ?Bathroom Shower/Tub: Tub/shower  unit ?  ?Bathroom Toilet: Standard ?  ?  ?Home Equipment: Shower seat;Toilet riser;BSC/3in1;Rolling Environmental consultant (2 wheels) ?  ?  ?  ? ?  ?Prior Functioning/Environment Prior Level of Function : Independent/Modified Independent ?  ?  ?  ?  ?  ?  ?Mobility Comments: Pt denies falls, endorses occasionally using a 2WW for functional mobility ?ADLs Comments: Pt endorses that she tries to be as independent as possible. Family assist with IADL management/driving. Pt states she does not leave her home much. ?  ? ?  ?  ?OT Problem List: Decreased strength;Decreased coordination;Decreased activity tolerance;Decreased safety awareness;Impaired balance (sitting and/or standing);Decreased knowledge of use of DME or AE ?  ?   ?OT Treatment/Interventions: Self-care/ADL training;Therapeutic exercise;Therapeutic activities;DME and/or AE instruction;Patient/family education;Balance training;Energy conservation  ?  ?OT Goals(Current goals can be found in the care plan section) Acute Rehab OT Goals ?Patient Stated Goal: "to be able to live my life again" ?OT Goal Formulation: With patient ?Time For Goal Achievement: 04/17/21 ?Potential to Achieve Goals: Good ?ADL Goals ?Pt Will Perform Grooming: with modified independence;sitting ?Pt Will Perform Lower Body Dressing: sit to/from stand;with supervision;with set-up ?Pt Will Transfer to Toilet: bedside commode;with modified independence;ambulating (c LRAD PRN for improved safetry/fxl indep.) ?Pt Will Perform Toileting - Clothing Manipulation and hygiene: sit to/from stand;with modified independence;with adaptive equipment (c LRAD PRN for improved safetry/fxl indep.)  ?OT Frequency: Min  2X/week ?  ? ?Co-evaluation   ?  ?  ?  ?  ? ?  ?AM-PAC OT "6 Clicks" Daily Activity     ?Outcome Measure Help from another person eating meals?: None ?Help from another person taking care of personal grooming?: A Little ?Help from another person toileting, which includes using toliet, bedpan, or urinal?: A  Little ?Help from another person bathing (including washing, rinsing, drying)?: A Little ?Help from another person to put on and taking off regular upper body clothing?: A Little ?Help from another person to put on and takin

## 2021-04-04 LAB — RENAL FUNCTION PANEL
Albumin: 2.2 g/dL — ABNORMAL LOW (ref 3.5–5.0)
Anion gap: 10 (ref 5–15)
BUN: 37 mg/dL — ABNORMAL HIGH (ref 8–23)
CO2: 21 mmol/L — ABNORMAL LOW (ref 22–32)
Calcium: 8 mg/dL — ABNORMAL LOW (ref 8.9–10.3)
Chloride: 108 mmol/L (ref 98–111)
Creatinine, Ser: 1.58 mg/dL — ABNORMAL HIGH (ref 0.44–1.00)
GFR, Estimated: 37 mL/min — ABNORMAL LOW (ref >60)
Glucose, Bld: 93 mg/dL (ref 70–99)
Phosphorus: 2 mg/dL — ABNORMAL LOW (ref 2.5–4.6)
Potassium: 3.7 mmol/L (ref 3.5–5.1)
Sodium: 139 mmol/L (ref 135–145)

## 2021-04-04 LAB — CULTURE, BLOOD (ROUTINE X 2): Special Requests: ADEQUATE

## 2021-04-04 MED ORDER — PROMETHAZINE HCL 12.5 MG PO TABS: 12.5000 mg | ORAL_TABLET | Freq: Four times a day (QID) | ORAL | 0 refills | Status: DC | PRN

## 2021-04-04 MED ORDER — CIPROFLOXACIN HCL 500 MG PO TABS: 500.0000 mg | ORAL_TABLET | Freq: Two times a day (BID) | ORAL | 0 refills | Status: AC

## 2021-04-04 MED ORDER — PREDNISONE 20 MG PO TABS
30.0000 mg | ORAL_TABLET | Freq: Every day | ORAL | Status: DC
  Administered 2021-04-04: 30 mg via ORAL
  Filled 2021-04-04: qty 1

## 2021-04-04 MED ORDER — METRONIDAZOLE 500 MG PO TABS: 500.0000 mg | ORAL_TABLET | Freq: Two times a day (BID) | ORAL | 0 refills | Status: AC

## 2021-04-04 MED ORDER — HEPARIN SOD (PORK) LOCK FLUSH 100 UNIT/ML IV SOLN
500.0000 [IU] | INTRAVENOUS | Status: AC | PRN
  Administered 2021-04-04: 500 [IU]

## 2021-04-04 NOTE — Progress Notes (Signed)
PT Cancellation Note ? ?Patient Details ?Name: Tanya Osborne ?MRN: [id number] ?DOB: 05/24/1960 ? ? ?Cancelled Treatment:    Reason Eval/Treat Not Completed: Other (comment).  Pt is with her family and they declined PT so she can meet with everyone.  Follow up as time and pt allow. ? ? ?Ramond Dial ?04/04/2021, 1:56 PM ? ?Mee Hives, PT PhD ?Acute Rehab Dept. Number: [facility] [phone] and [place] [phone] ? ?

## 2021-04-04 NOTE — Discharge Summary (Signed)
?Physician Discharge Summary ?  ?Patient: Tanya Osborne MRN: [id number] DOB: November 20, 1960  ?Admit date:     04/01/2021  ?Discharge date: 04/04/21  ?Discharge Physician: Lorella Nimrod  ? ?PCP: Annice Needy, MD  ? ?Recommendations at discharge:  ?Patient is discharge on 7 days of antibiotics for Klebsiella bacteremia. ?Patient with advanced colon cancer with disease progression and unable to tolerate chemotherapy, being discharged with home hospice. ?Patient will be high risk for intestinal obstruction due to the disease burden. ?Patient want to continue with IV fluid every other week as she is getting now, communicated with hospice services. ?Patient has guarded prognosis. ? ?Discharge Diagnoses: ?Principal Problem: ?  Severe sepsis ([place]) ?Active Problems: ?  Hyponatremia ?  Acute respiratory failure with hypoxia ([place]) ?  Acute renal failure superimposed on stage 3b chronic kidney disease ([place]) ?  History of colon cancer ?  RA (rheumatoid arthritis) ([place]) ?  Essential hypertension ?  Malnutrition of moderate degree ?  Elevated d-dimer ?  Protein-calorie malnutrition, severe ? ?Resolved Problems: ?  Sepsis ([place]) ?  SIRS (systemic inflammatory response syndrome) (HCC) ? ?Hospital Course: ?Taken from H&P. ? ?[name] is a 61 y.o. female with medical history significant of rheumatoid arthritis on prednisone chronically, hypertension, hyperlipidemia, GERD, primary sclerosing cholangitis, CKD-3B, anemia, perforated duodenal ulcer, autoimmune liver disease, small bowel obstruction, colon cancer (s/p colectomy and end ileostomy at [facility] in February of 2020. Currently not chemotherapy, seen by palliative care), who presents with SOB. ?She was found to be hypoxic at 75% on room air and was placed on 2 L of oxygen via nasal cannula with improvement to 96%.  No baseline oxygen use.  Patient has mild cough with clear mucus, no chest pain, fever or chills.  Patient has chronic intermittent nausea and vomiting which has not  been changed.  No diarrhea or abdominal pain.  She denies any urinary symptoms.  No history of any wound. ? ?On arrival she met sepsis criteria with leukocytosis, tachycardia and tachypnea. ?Received antibiotics with cefepime, vancomycin and Flagyl. ?Blood culture came back positive for Klebsiella pneumonia, procalcitonin elevated at 14.05 which increased to 51.96 today.  Urine cultures pending but UA does not look infected. ?For concern of intra-abdominal infection-CT abdomen was obtained which shows a lot of disease progression, causing hydronephrosis and also a concern of fluid collection/abscess. ?Please see full CT report. ?Initially antibiotics switched to ceftriaxone after getting positive blood cultures and Flagyl was added for concern of intra-abdominal infection. ? ?Palliative care was also consulted.  Patient does not want any surgical procedure. ?Palliative to discuss with them regarding percutaneous drain placement if needed. ?She was also having elevated D-dimer and platelets dropped today to 86, fibrinogen levels were also elevated which were not consistent with DIC. ? ?Patient has very guarded prognosis. ? ?3/24; patient and family initially wants to try every possible medical intervention and also requesting surgery consult, surgery was consulted and she is not a candidate for any surgical intervention. ?She was also evaluated by IR, on reviewing the CT scan she does not have enough collection to put a drain in.  They are not very concerned that it is an abscess, most likely small amount of ascites. ?They were recommending monitoring only at this time. ? ?Repeat blood cultures done on 04/03/2021 are negative so far. ?Otherwise remained clinically stable. ? ?Patient remained stable and afebrile.  Patient has disease progression and high risk for intestinal obstruction due to the disease burden.  She is not a candidate for  continuation of chemotherapy. ?Palliative care was also consulted.  We consulted  home hospice services as she needs some help at home.  She was discharged home with home hospice for further symptom management. ?Patient received ceftriaxone and Flagyl while in the hospital and discharged home on Cipro and Flagyl for 1 more week for concern of intra-abdominal source of infection.  No obvious source identified at this time. ? ?Patient will continue with her home medications, no need for steroid taper and she can resume her home dose on discharge. ? ? ? ?Assessment and Plan: ?* Severe sepsis ([place]) ?Patient met severe sepsis criteria with leukocytosis, tachycardia, tachypnea, lactic acidosis and AKI.  Blood cultures growing Klebsiella pneumonia, urine cultures pending.  CT abdomen was obtained for concern of intra-abdominal process which shows quite impressive disease progression and a concern of intra-abdominal abscess.  General surgery was consulted and she is not a candidate for any surgical intervention.  IR was also consulted and she does not have enough fluid collection for drainage.  Per radiology less likely an abscess, can be a small amount of ascites which need monitoring. ?-Continue ceftriaxone and Flagyl ?-Repeat blood cultures ?-Continue with supportive care ?-PT/OT evaluation ? ?Hyponatremia ?Resolved. ?Most likely secondary to poor p.o. intake.  Sodium improving with IV fluid. ?-Continue to monitor ? ?Acute respiratory failure with hypoxia ([place]) ?Resolved-patient is now on room air ?Patient requiring up to 2 L of oxygen as she was desaturating in 70s on arrival.  No prior oxygen use.  Most likely secondary to sepsis. ? ?Acute renal failure superimposed on stage 3b chronic kidney disease ([place]) ?Worsening renal function most likely secondary to poor p.o. intake as improving with IV fluid. ?-Continue with IV fluid for 1 more day ?-Monitor renal function ?-Avoid nephrotoxins ? ?History of colon cancer ?Repeat CT abdomen with disease progression. ?Patient was unable to tolerate  chemo. ?-Palliative care consult ? ?RA (rheumatoid arthritis) ([place]) ?Patient was on low-dose steroid at home.  She was started on stress doses. ?-Decrease steroids to prednisone 50 mg daily today, plan is to quickly taper to her baseline dose. ? ?Essential hypertension ?Blood pressure soft on admission, now within lower normal goal ?-Keep holding antihypertensives ? ?Malnutrition of moderate degree ?Estimated body mass index is 22.75 kg/m? as calculated from the following: ?  Height as of this encounter: _0  (1.651 m). ?  Weight as of this encounter: 62 kg.  ? ?-Dietitian consult ? ?Elevated d-dimer ?Elevated D-dimer-patient has active malignancy.  VQ scan was done in ED which was negative for PE.  Venous Doppler studies were negative for DVT. ?Patient also had worsening thrombocytopenia today. ?Concern of DIC ?-Check fibrinogen ? ? ? ?Pain control - [employer] Controlled Substance Reporting System database was reviewed. and patient was instructed, not to drive, operate heavy machinery, perform activities at heights, swimming or participation in water activities or provide baby-sitting services while on Pain, Sleep and Anxiety Medications; until their outpatient Physician has advised to do so again. Also recommended to not to take more than prescribed Pain, Sleep and Anxiety Medications.  ? ?Consultants: Palliative care, general surgery, IR ? ?Procedures performed: None ?Disposition: Hospice care ?Diet recommendation:  ?Discharge Diet Orders (From admission, onward)  ? ?  Start     Ordered  ? 04/04/21 0000  Diet - low sodium heart healthy       ? 04/04/21 1418  ? ?  ?  ? ?  ? ?Regular diet ?DISCHARGE MEDICATION: ?Allergies as of 04/04/2021   ? ?  Reactions  ? Nsaids Swelling, Rash  ? Other Reaction: OTHER REACTION-GI BLEED  ? Penicillins Swelling  ? Percocet [oxycodone-acetaminophen] Nausea And Vomiting  ? Celebrex [celecoxib] Swelling  ? Cortisone Swelling  ? Gabapentin Swelling  ? Methotrexate Derivatives  Swelling  ? Nabumetone Other (See Comments)  ? Tomato Rash  ? Joint inflamation  ? ?  ? ?  ?Medication List  ?  ? ?STOP taking these medications   ? ?Cod Liver Oil 5000-500 UNIT/5ML Oil ?  ?dexamethasone 4 MG tabl

## 2021-04-04 NOTE — Progress Notes (Signed)
Manufacturing engineer (ACC) ? ?Referral received for hospice services as discharge. ? ?Met with patient and her daughters at the bedside.  ? ?Provided support and answered questions. Discussed what hospice support would look like in the home and the decision was made to d/c with hospice support. ? ?No DME is currently needed for discharge and her daughters will take her home via POV. ? ?Comfort medications are mostly in place with the exception of antiemetic and the attending will arrange for that, flagyl and cipro to be sent to their pharmacy. ? ?Thank you for this referral, ?Venia Carbon BSN, RN ?[facility] Liaison  ?

## 2021-04-04 NOTE — Progress Notes (Signed)
PT Cancellation Note ? ?Patient Details ?Name: Tanya Osborne ?MRN: [id number] ?DOB: [birth year] ? ? ?Cancelled Treatment:    Reason Eval/Treat Not Completed: Other (comment). Trying to reach family over a death and impending death of her family members, since there are several who do not know she is admitted.  Follow up later today. ? ? ?Ramond Dial ?04/04/2021, 12:10 PM ? ?Mee Hives, PT PhD ?Acute Rehab Dept. Number: [facility] [phone] and [place] [phone] ? ?

## 2021-04-04 NOTE — TOC Progression Note (Signed)
Transition of Care (TOC) - Progression Note  ? ? ?Patient Details  ?Name: Tanya Osborne ?MRN: [id number] ?Date of Birth: 04/25/1960 ? ?Transition of Care (TOC) CM/SW Contact  ?[place], Naomi, [place] ?Phone Number: ?04/04/2021, 12:12 PM ? ?Clinical Narrative:    ?Spoke with patient and patient's daughters by phone who are in agreement with referral to in home hospice through [place] [facility]. Hospice liaison to meet with patient and her daughters at bedside before discharge. ? ?[employer], LCSW ?Transition of Care ?[phone] ? ? ?Expected Discharge Plan: [place] ?Barriers to Discharge: Continued Medical Work up ? ?Expected Discharge Plan and Services ?Expected Discharge Plan: [place] ?  ?Discharge Planning Services: CM Consult ?Post Acute Care Choice: Home Health ?Living arrangements for the past 2 months: [place] ?                ?DME Arranged: N/A ?DME Agency: NA ?  ?  ?  ?HH Arranged: RN, PT, OT, Nurse's Aide, Social Work ?  ?  ?  ?  ? ? ?Social Determinants of Health (SDOH) Interventions ?  ? ?Readmission Risk Interventions ?   ? View : No data to display.  ?  ?  ?  ? ? ?

## 2021-04-08 LAB — CULTURE, BLOOD (ROUTINE X 2)
Culture: NO GROWTH
Culture: NO GROWTH

## 2021-04-14 ENCOUNTER — Inpatient Hospital Stay
Admission: EM | Admit: 2021-04-14 | Discharge: 2021-04-17 | DRG: 064 | Disposition: A | Discharge status: Hospice | Attending: Internal Medicine | Admitting: Internal Medicine

## 2021-04-14 ENCOUNTER — Emergency Department

## 2021-04-14 ENCOUNTER — Inpatient Hospital Stay

## 2021-04-14 ENCOUNTER — Encounter: Payer: Self-pay | Admitting: Radiology

## 2021-04-14 ENCOUNTER — Other Ambulatory Visit: Payer: Self-pay

## 2021-04-14 DIAGNOSIS — K219 Gastro-esophageal reflux disease without esophagitis: Secondary | ICD-10-CM | POA: Diagnosis present

## 2021-04-14 DIAGNOSIS — R778 Other specified abnormalities of plasma proteins: Secondary | ICD-10-CM | POA: Diagnosis present

## 2021-04-14 DIAGNOSIS — Z91018 Allergy to other foods: Secondary | ICD-10-CM

## 2021-04-14 DIAGNOSIS — M6281 Muscle weakness (generalized): Secondary | ICD-10-CM | POA: Diagnosis not present

## 2021-04-14 DIAGNOSIS — R918 Other nonspecific abnormal finding of lung field: Secondary | ICD-10-CM | POA: Diagnosis present

## 2021-04-14 DIAGNOSIS — Z888 Allergy status to other drugs, medicaments and biological substances status: Secondary | ICD-10-CM

## 2021-04-14 DIAGNOSIS — C189 Malignant neoplasm of colon, unspecified: Secondary | ICD-10-CM | POA: Diagnosis present

## 2021-04-14 DIAGNOSIS — N390 Urinary tract infection, site not specified: Secondary | ICD-10-CM | POA: Diagnosis present

## 2021-04-14 DIAGNOSIS — I6340 Cerebral infarction due to embolism of unspecified cerebral artery: Secondary | ICD-10-CM | POA: Diagnosis present

## 2021-04-14 DIAGNOSIS — E7800 Pure hypercholesterolemia, unspecified: Secondary | ICD-10-CM | POA: Diagnosis present

## 2021-04-14 DIAGNOSIS — R110 Nausea: Secondary | ICD-10-CM | POA: Diagnosis not present

## 2021-04-14 DIAGNOSIS — R319 Hematuria, unspecified: Secondary | ICD-10-CM | POA: Diagnosis present

## 2021-04-14 DIAGNOSIS — Z7189 Other specified counseling: Secondary | ICD-10-CM

## 2021-04-14 DIAGNOSIS — R471 Dysarthria and anarthria: Secondary | ICD-10-CM | POA: Diagnosis present

## 2021-04-14 DIAGNOSIS — Z880 Allergy status to penicillin: Secondary | ICD-10-CM

## 2021-04-14 DIAGNOSIS — C786 Secondary malignant neoplasm of retroperitoneum and peritoneum: Secondary | ICD-10-CM | POA: Diagnosis present

## 2021-04-14 DIAGNOSIS — Z9049 Acquired absence of other specified parts of digestive tract: Secondary | ICD-10-CM

## 2021-04-14 DIAGNOSIS — K769 Liver disease, unspecified: Secondary | ICD-10-CM | POA: Diagnosis present

## 2021-04-14 DIAGNOSIS — I129 Hypertensive chronic kidney disease with stage 1 through stage 4 chronic kidney disease, or unspecified chronic kidney disease: Secondary | ICD-10-CM | POA: Diagnosis present

## 2021-04-14 DIAGNOSIS — E43 Unspecified severe protein-calorie malnutrition: Secondary | ICD-10-CM | POA: Diagnosis not present

## 2021-04-14 DIAGNOSIS — N183 Chronic kidney disease, stage 3 unspecified: Secondary | ICD-10-CM | POA: Diagnosis present

## 2021-04-14 DIAGNOSIS — Z681 Body mass index (BMI) 19 or less, adult: Secondary | ICD-10-CM | POA: Diagnosis not present

## 2021-04-14 DIAGNOSIS — Z789 Other specified health status: Secondary | ICD-10-CM

## 2021-04-14 DIAGNOSIS — C787 Secondary malignant neoplasm of liver and intrahepatic bile duct: Secondary | ICD-10-CM | POA: Diagnosis present

## 2021-04-14 DIAGNOSIS — N1831 Chronic kidney disease, stage 3a: Secondary | ICD-10-CM | POA: Diagnosis present

## 2021-04-14 DIAGNOSIS — J90 Pleural effusion, not elsewhere classified: Secondary | ICD-10-CM | POA: Diagnosis present

## 2021-04-14 DIAGNOSIS — J9601 Acute respiratory failure with hypoxia: Secondary | ICD-10-CM | POA: Diagnosis present

## 2021-04-14 DIAGNOSIS — A419 Sepsis, unspecified organism: Secondary | ICD-10-CM | POA: Diagnosis not present

## 2021-04-14 DIAGNOSIS — E876 Hypokalemia: Secondary | ICD-10-CM | POA: Diagnosis present

## 2021-04-14 DIAGNOSIS — R29818 Other symptoms and signs involving the nervous system: Secondary | ICD-10-CM

## 2021-04-14 DIAGNOSIS — G8191 Hemiplegia, unspecified affecting right dominant side: Secondary | ICD-10-CM | POA: Diagnosis present

## 2021-04-14 DIAGNOSIS — Z20822 Contact with and (suspected) exposure to covid-19: Secondary | ICD-10-CM | POA: Diagnosis present

## 2021-04-14 DIAGNOSIS — I639 Cerebral infarction, unspecified: Secondary | ICD-10-CM | POA: Diagnosis present

## 2021-04-14 DIAGNOSIS — R54 Age-related physical debility: Secondary | ICD-10-CM | POA: Diagnosis present

## 2021-04-14 DIAGNOSIS — B961 Klebsiella pneumoniae [K. pneumoniae] as the cause of diseases classified elsewhere: Secondary | ICD-10-CM | POA: Diagnosis present

## 2021-04-14 DIAGNOSIS — R7881 Bacteremia: Secondary | ICD-10-CM | POA: Diagnosis present

## 2021-04-14 DIAGNOSIS — Z66 Do not resuscitate: Secondary | ICD-10-CM | POA: Diagnosis not present

## 2021-04-14 DIAGNOSIS — Z79899 Other long term (current) drug therapy: Secondary | ICD-10-CM

## 2021-04-14 DIAGNOSIS — Z85038 Personal history of other malignant neoplasm of large intestine: Secondary | ICD-10-CM | POA: Diagnosis present

## 2021-04-14 DIAGNOSIS — Z7952 Long term (current) use of systemic steroids: Secondary | ICD-10-CM

## 2021-04-14 DIAGNOSIS — R29703 NIHSS score 3: Secondary | ICD-10-CM | POA: Diagnosis present

## 2021-04-14 DIAGNOSIS — Z8249 Family history of ischemic heart disease and other diseases of the circulatory system: Secondary | ICD-10-CM

## 2021-04-14 DIAGNOSIS — Z933 Colostomy status: Secondary | ICD-10-CM

## 2021-04-14 DIAGNOSIS — M069 Rheumatoid arthritis, unspecified: Secondary | ICD-10-CM | POA: Diagnosis present

## 2021-04-14 DIAGNOSIS — Z515 Encounter for palliative care: Secondary | ICD-10-CM

## 2021-04-14 DIAGNOSIS — Z886 Allergy status to analgesic agent status: Secondary | ICD-10-CM

## 2021-04-14 DIAGNOSIS — I6389 Other cerebral infarction: Secondary | ICD-10-CM | POA: Diagnosis not present

## 2021-04-14 DIAGNOSIS — G629 Polyneuropathy, unspecified: Secondary | ICD-10-CM | POA: Diagnosis present

## 2021-04-14 DIAGNOSIS — R29810 Facial weakness: Secondary | ICD-10-CM | POA: Diagnosis present

## 2021-04-14 DIAGNOSIS — Z885 Allergy status to narcotic agent status: Secondary | ICD-10-CM

## 2021-04-14 LAB — COMPREHENSIVE METABOLIC PANEL
ALT: 23 U/L (ref 0–44)
AST: 42 U/L — ABNORMAL HIGH (ref 15–41)
Albumin: 2.8 g/dL — ABNORMAL LOW (ref 3.5–5.0)
Alkaline Phosphatase: 342 U/L — ABNORMAL HIGH (ref 38–126)
Anion gap: 17 — ABNORMAL HIGH (ref 5–15)
BUN: 16 mg/dL (ref 8–23)
CO2: 21 mmol/L — ABNORMAL LOW (ref 22–32)
Calcium: 8.5 mg/dL — ABNORMAL LOW (ref 8.9–10.3)
Chloride: 101 mmol/L (ref 98–111)
Creatinine, Ser: 1.25 mg/dL — ABNORMAL HIGH (ref 0.44–1.00)
GFR, Estimated: 49 mL/min — ABNORMAL LOW (ref >60)
Glucose, Bld: 77 mg/dL (ref 70–99)
Potassium: 3.4 mmol/L — ABNORMAL LOW (ref 3.5–5.1)
Sodium: 139 mmol/L (ref 135–145)
Total Bilirubin: 3.6 mg/dL — ABNORMAL HIGH (ref 0.3–1.2)
Total Protein: 5.9 g/dL — ABNORMAL LOW (ref 6.5–8.1)

## 2021-04-14 LAB — CBC WITH DIFFERENTIAL/PLATELET
Abs Immature Granulocytes: 0.1 10*3/uL — ABNORMAL HIGH (ref 0.00–0.07)
Basophils Absolute: 0.1 10*3/uL (ref 0.0–0.1)
Basophils Relative: 0 %
Eosinophils Absolute: 0 10*3/uL (ref 0.0–0.5)
Eosinophils Relative: 0 %
HCT: 30.7 % — ABNORMAL LOW (ref 36.0–46.0)
Hemoglobin: 10 g/dL — ABNORMAL LOW (ref 12.0–15.0)
Immature Granulocytes: 1 %
Lymphocytes Relative: 3 %
Lymphs Abs: 0.6 10*3/uL — ABNORMAL LOW (ref 0.7–4.0)
MCH: 29.4 pg (ref 26.0–34.0)
MCHC: 32.6 g/dL (ref 30.0–36.0)
MCV: 90.3 fL (ref 80.0–100.0)
Monocytes Absolute: 1.2 10*3/uL — ABNORMAL HIGH (ref 0.1–1.0)
Monocytes Relative: 6 %
Neutro Abs: 17 10*3/uL — ABNORMAL HIGH (ref 1.7–7.7)
Neutrophils Relative %: 90 %
Platelets: 236 10*3/uL (ref 150–400)
RBC: 3.4 MIL/uL — ABNORMAL LOW (ref 3.87–5.11)
RDW: 16.7 % — ABNORMAL HIGH (ref 11.5–15.5)
WBC: 19.1 10*3/uL — ABNORMAL HIGH (ref 4.0–10.5)
nRBC: 0 % (ref 0.0–0.2)

## 2021-04-14 LAB — URINALYSIS, COMPLETE (UACMP) WITH MICROSCOPIC
Bacteria, UA: NONE SEEN
Glucose, UA: NEGATIVE mg/dL
Ketones, ur: 20 mg/dL — AB
Nitrite: NEGATIVE
Protein, ur: 100 mg/dL — AB
Specific Gravity, Urine: 1.018 (ref 1.005–1.030)
Squamous Epithelial / HPF: >50 — ABNORMAL HIGH (ref 0–5)
pH: 5 (ref 5.0–8.0)

## 2021-04-14 LAB — BLOOD GAS, VENOUS
Acid-base deficit: 2.2 mmol/L — ABNORMAL HIGH (ref 0.0–2.0)
Bicarbonate: 22.4 mmol/L (ref 20.0–28.0)
O2 Saturation: 64 %
Patient temperature: 37
pCO2, Ven: 37 mmHg — ABNORMAL LOW (ref 44–60)
pH, Ven: 7.39 (ref 7.25–7.43)
pO2, Ven: 38 mmHg (ref 32–45)

## 2021-04-14 LAB — TROPONIN I (HIGH SENSITIVITY)
Troponin I (High Sensitivity): 187 ng/L (ref <18)
Troponin I (High Sensitivity): 208 ng/L (ref <18)
Troponin I (High Sensitivity): 187 ng/L (ref <18)

## 2021-04-14 LAB — LACTIC ACID, PLASMA: Lactic Acid, Venous: 1.1 mmol/L (ref 0.5–1.9)

## 2021-04-14 LAB — PROCALCITONIN: Procalcitonin: 0.51 ng/mL

## 2021-04-14 LAB — APTT: aPTT: 33 seconds (ref 24–36)

## 2021-04-14 LAB — MAGNESIUM: Magnesium: 1.4 mg/dL — ABNORMAL LOW (ref 1.7–2.4)

## 2021-04-14 LAB — RESP PANEL BY RT-PCR (FLU A&B, COVID) ARPGX2
Influenza A by PCR: NEGATIVE
Influenza B by PCR: NEGATIVE
SARS Coronavirus 2 by RT PCR: NEGATIVE

## 2021-04-14 MED ORDER — SODIUM CHLORIDE 0.9 % IV SOLN: 2.0000 g | Freq: Once | INTRAVENOUS | Status: DC

## 2021-04-14 MED ORDER — CLOPIDOGREL BISULFATE 75 MG PO TABS
75.0000 mg | ORAL_TABLET | Freq: Every day | ORAL | Status: DC
  Administered 2021-04-14 – 2021-04-17 (×4): 75 mg via ORAL
  Filled 2021-04-14 (×4): qty 1

## 2021-04-14 MED ORDER — IOHEXOL 350 MG/ML SOLN
75.0000 mL | Freq: Once | INTRAVENOUS | Status: AC | PRN
  Administered 2021-04-14: 75 mL via INTRAVENOUS

## 2021-04-14 MED ORDER — LACTATED RINGERS IV BOLUS (SEPSIS)
1000.0000 mL | Freq: Once | INTRAVENOUS | Status: AC
  Administered 2021-04-14: 1000 mL via INTRAVENOUS

## 2021-04-14 MED ORDER — PANTOPRAZOLE SODIUM 40 MG PO TBEC
40.0000 mg | DELAYED_RELEASE_TABLET | Freq: Every day | ORAL | Status: DC
  Administered 2021-04-14 – 2021-04-17 (×4): 40 mg via ORAL
  Filled 2021-04-14 (×4): qty 1

## 2021-04-14 MED ORDER — CEFTRIAXONE SODIUM 2 G IJ SOLR
2.0000 g | INTRAMUSCULAR | Status: DC
  Administered 2021-04-14 – 2021-04-16 (×3): 2 g via INTRAVENOUS
  Filled 2021-04-14 (×2): qty 20
  Filled 2021-04-14: qty 2

## 2021-04-14 MED ORDER — POTASSIUM CHLORIDE IN NACL 40-0.9 MEQ/L-% IV SOLN
INTRAVENOUS | Status: DC
  Filled 2021-04-14 (×4): qty 1000

## 2021-04-14 MED ORDER — MORPHINE SULFATE (PF) 2 MG/ML IV SOLN
2.0000 mg | Freq: Once | INTRAVENOUS | Status: AC
  Administered 2021-04-14: 2 mg via INTRAVENOUS
  Filled 2021-04-14: qty 1

## 2021-04-14 MED ORDER — ONDANSETRON HCL 4 MG/2ML IJ SOLN
4.0000 mg | Freq: Once | INTRAMUSCULAR | Status: AC
  Administered 2021-04-14: 4 mg via INTRAVENOUS
  Filled 2021-04-14: qty 2

## 2021-04-14 MED ORDER — MAGNESIUM SULFATE 2 GM/50ML IV SOLN
2.0000 g | Freq: Once | INTRAVENOUS | Status: AC
  Administered 2021-04-14: 2 g via INTRAVENOUS
  Filled 2021-04-14: qty 50

## 2021-04-14 MED ORDER — LACTATED RINGERS IV SOLN: INTRAVENOUS | Status: DC

## 2021-04-14 MED ORDER — ATORVASTATIN CALCIUM 20 MG PO TABS
40.0000 mg | ORAL_TABLET | Freq: Every day | ORAL | Status: DC
  Administered 2021-04-14 – 2021-04-16 (×3): 40 mg via ORAL
  Filled 2021-04-14 (×4): qty 2

## 2021-04-14 MED ORDER — GLUCERNA SHAKE PO LIQD
237.0000 mL | Freq: Three times a day (TID) | ORAL | Status: DC
  Administered 2021-04-14 – 2021-04-16 (×4): 237 mL via ORAL

## 2021-04-14 MED ORDER — STROKE: EARLY STAGES OF RECOVERY BOOK: Freq: Once | Status: DC

## 2021-04-14 MED ORDER — PREDNISONE 10 MG PO TABS
5.0000 mg | ORAL_TABLET | Freq: Every day | ORAL | Status: DC
  Administered 2021-04-14 – 2021-04-17 (×4): 5 mg via ORAL
  Filled 2021-04-14 (×4): qty 1

## 2021-04-14 MED ORDER — GADOBUTROL 1 MMOL/ML IV SOLN
6.0000 mL | Freq: Once | INTRAVENOUS | Status: AC | PRN
  Administered 2021-04-14: 6 mL via INTRAVENOUS

## 2021-04-14 MED ORDER — SODIUM CHLORIDE 0.9 % IV SOLN
2.0000 g | Freq: Once | INTRAVENOUS | Status: DC
  Filled 2021-04-14: qty 10

## 2021-04-14 NOTE — ED Provider Notes (Signed)
? ?[facility] ?Provider Note ? ? ? Event Date/Time  ? First MD Initiated Contact with Patient 04/14/21 [phone]   ?  (approximate) ? ? ?History  ? ?Weakness ? ? ?HPI ? ?Clotilde Loth is a 61 y.o. female who 39-year-old female recent hospital discharge on 3/25 for sepsis.  Patient was noted to be tachycardic yesterday.  When she woke up this morning from sleep overnight she was unable to move her right side.  She had slurred speech then.  Currently her speech is okay her right arm is weak but she is able to hold it up. ? ?  ? ? ?Physical Exam  ? ?Triage Vital Signs: ?ED Triage Vitals  ?Enc Vitals Group  ?   BP 04/14/21 0812 107/76  ?   Pulse Rate 04/14/21 0812 (!) 128  ?   Resp 04/14/21 0812 18  ?   Temp 04/14/21 0906 98.6 ?F (37 ?C)  ?   Temp Source 04/14/21 0906 Oral  ?   SpO2 04/14/21 0812 100 %  ?   Weight 04/14/21 0821 136 lb (61.7 kg)  ?   Height 04/14/21 0821 '5\' 5"'$  (1.651 m)  ?   Head Circumference --   ?   Peak Flow --   ?   Pain Score --   ?   Pain Loc --   ?   Pain Edu? --   ?   Excl. in [place]? --   ? ? ?Most recent vital signs: ?Vitals:  ? 04/14/21 0812 04/14/21 0906  ?BP: 107/76   ?Pulse: (!) 128   ?Resp: 18   ?Temp:  98.6 ?F (37 ?C)  ?SpO2: 100%   ? ? ? ?General: Awake, alert and oriented no acute distress currently ?CV:  Good peripheral perfusion.  Heart no audible murmurs very tachycardic rate 129 ?Resp:  Normal effort.  Lungs are clear ?Abd:  No distention.  ?Extremities: No edema ?Neurology.  Right arm is weak but able to overcome gravity right leg patient cannot get it up off the bed.  This is new since last night.  Cerebellar finger-nose in the hands is normal but slow no other obvious cranial nerve defects were seen but visual fields were not checked ? ? ?ED Results / Procedures / Treatments  ? ?Labs ?(all labs ordered are listed, but only abnormal results are displayed) ?Labs Reviewed  ?CBC WITH DIFFERENTIAL/PLATELET - Abnormal; Notable for the following components:  ?    Result  Value  ? WBC 19.1 (*)   ? RBC 3.40 (*)   ? Hemoglobin 10.0 (*)   ? HCT 30.7 (*)   ? RDW 16.7 (*)   ? Neutro Abs 17.0 (*)   ? Lymphs Abs 0.6 (*)   ? Monocytes Absolute 1.2 (*)   ? Abs Immature Granulocytes 0.10 (*)   ? All other components within normal limits  ?COMPREHENSIVE METABOLIC PANEL - Abnormal; Notable for the following components:  ? Potassium 3.4 (*)   ? CO2 21 (*)   ? Creatinine, Ser 1.25 (*)   ? Calcium 8.5 (*)   ? Total Protein 5.9 (*)   ? Albumin 2.8 (*)   ? AST 42 (*)   ? Alkaline Phosphatase 342 (*)   ? Total Bilirubin 3.6 (*)   ? GFR, Estimated 49 (*)   ? Anion gap 17 (*)   ? All other components within normal limits  ?URINALYSIS, COMPLETE (UACMP) WITH MICROSCOPIC - Abnormal; Notable for the following components:  ? Color,  Urine AMBER (*)   ? APPearance CLOUDY (*)   ? Hgb urine dipstick SMALL (*)   ? Bilirubin Urine SMALL (*)   ? Ketones, ur 20 (*)   ? Protein, ur 100 (*)   ? Leukocytes,Ua MODERATE (*)   ? Squamous Epithelial / LPF >50 (*)   ? Non Squamous Epithelial PRESENT (*)   ? All other components within normal limits  ?BLOOD GAS, VENOUS - Abnormal; Notable for the following components:  ? pCO2, Ven 37 (*)   ? Acid-base deficit 2.2 (*)   ? All other components within normal limits  ?TROPONIN I (HIGH SENSITIVITY) - Abnormal; Notable for the following components:  ? Troponin I (High Sensitivity) 187 (*)   ? All other components within normal limits  ?RESP PANEL BY RT-PCR (FLU A&B, COVID) ARPGX2  ?CULTURE, BLOOD (ROUTINE X 2)  ?CULTURE, BLOOD (ROUTINE X 2)  ?APTT  ?LACTIC ACID, PLASMA  ?LACTIC ACID, PLASMA  ?PROCALCITONIN  ?TROPONIN I (HIGH SENSITIVITY)  ? ? ? ?EKG ? ?EKG #1 read and interpreted by me shows sinus tachycardia rate of 129 rightward axis flipped T waves inferiorly and in the lateral chest leads. ?EKG #2 read interpreted by me shows sinus tachycardia at 129 rightward axis slightly less prominent T wave signs. ? ? ?RADIOLOGY ?Chest x-ray read by radiology films reviewed by me does not  show any acute disease except for small effusions bilaterally ?CT of the head read by radiology films reviewed by me does not show any acute disease ? ?PROCEDURES: ? ?Critical Care performed: Critical care time 30 minutes.  This includes speaking with the patient and both daughters several times I also spoke with neurology on speaking with the internist as well. ? ?Procedures ? ? ?MEDICATIONS ORDERED IN ED: ?Medications  ?lactated ringers infusion (has no administration in time range)  ?aztreonam (AZACTAM) 2 g in sodium chloride 0.9 % 100 mL IVPB (has no administration in time range)  ?lactated ringers bolus 1,000 mL (has no administration in time range)  ?  And  ?lactated ringers bolus 1,000 mL (has no administration in time range)  ? ? ? ?IMPRESSION / MDM / ASSESSMENT AND PLAN / ED COURSE  ?I reviewed the triage vital signs and the nursing notes. ?Patient with apparent stroke last night while asleep.  She does not meet criteria for tPA.  She does not have an apparent large vessel occlusion.  Neurology feels it is very unlikely for this anyway with the not severe stroke symptoms she is having.  Patient and daughter wish the apparent sepsis and stroke treated.  I have consulted neurology who has already seen the patient and ordered MRIs.  I have also consulted hospitalist for admission.  Patient does have sepsis sepsis orders are in.  Apparent source is UTI.  Patient troponin is elevated but this apparently is from strain from the tachycardia and sepsis. ? ? ? ?The patient is on the cardiac monitor to evaluate for evidence of arrhythmia and/or significant heart rate changes.  Only sinus tachycardia seen. ? ? ? ?FINAL CLINICAL IMPRESSION(S) / ED DIAGNOSES  ? ?Final diagnoses:  ?Urinary tract infection with hematuria, site unspecified  ?Sepsis, due to unspecified organism, unspecified whether acute organ dysfunction present [facility])  ?Elevated troponin  ?Cerebrovascular accident (CVA), unspecified mechanism ([place])   ? ? ? ?Rx / DC Orders  ? ?ED Discharge Orders   ? ? None  ? ?  ? ? ? ?Note:  This document was prepared using Dragon voice  recognition software and may include unintentional dictation errors. ?  ?Nena Polio, MD ?04/14/21 1045 ? ?

## 2021-04-14 NOTE — Consult Note (Signed)
Neurology Consultation ?Reason for Consult: Right-sided weakness ?Requesting Physician: Conni Slipper ? ?CC: resolving Right sided weakness  ? ?History is obtained from: Chart review, patient, providers and family at bedside ? ?HPI: Tanya Osborne is a 61 y.o. female with a past medical history significant for rheumatoid arthritis on chronic prednisone, autoimmune liver disease, terminal colon cancer receiving hospice care c/b small bowel obstruction s/p colostomy, recent sepsis (04/01/2021, Klebsiella pneumoniae), CKD with acute renal failure and GFR typically less than 30, malnutrition, neuropathy.  ? ?She had been in her normal state of health last night when she went to bed around 2130, but was found by daughter at 7:30 AM with right-sided weakness, slurred speech, unclear if garbled or not due to severity of the slurring.  She was confused and slow to respond but was able to follow some simple commands.  She had very significant right-sided weakness and facial droop which have been improving gradually. ? ?Family also notes that she had a severe headache and light sensitivity lasting about 2 days which resolved approximately 4 days ago which they associated with her oral antibiotics.  Additionally she ate very little yesterday, not being able to consume even her boost, and missing the majority of her medications other than promethazine.  Denies any shaking, tongue bite, or urinary incontinence ? ?OT note 04/03/2021 ?Per pt her family assists her with her colostomy management, and IADL tasks including cooking/meal prep. ?Patient can return home with the following A little help with walking and/or transfers;A little help with bathing/dressing/bathroom;Assistance with cooking/housework;Assist for transportation;Help with stairs or ramp for entrance  ? ? ?LKW: evening of 4/3  ?tPA given?: No, out of the window  ?IA performed?: No, exam not consistent with LVO  ?Premorbid modified rankin scale: 3-4 ?    3 - Moderate  disability. Requires some help, but able to walk unassisted. ?    4 - Moderately severe disability. Unable to attend to own bodily needs without assistance, and unable to walk unassisted. ? ?ROS: All other review of systems was negative except as noted in the HPI.  ? ?Past Medical History:  ?Diagnosis Date  ? Autoimmune liver disease   ? Cancer [facility])   ? Depression   ? Duodenal ulcer perforation ([place]) 09/08/2008  ? GERD (gastroesophageal reflux disease)   ? H. pylori infection 2010  ? Hypercholesterolemia 2018  ? Iron deficiency anemia   ? Kidney disease, chronic, stage IV (GFR 15-29 ml/min) (HCC)   ? 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 RA (rheumatoid arthritis) ([place])   ? Sepsis ([place])   ? ?Past Surgical History:  ?Procedure Laterality Date  ? INCISION AND DRAINAGE ABSCESS Left 10/21/2020  ? Procedure: INCISION AND DRAINAGE ABSCESS;  Surgeon: Ronny Bacon, MD;  Location: [facility] ORS;  Service: General;  Laterality: Left;  ? PYLOROPLASTY  09/08/2008  ? with omental patch  ? TOTAL COLECTOMY  03/06/2018  ? total abdominal colectomy with end ileostomy  ? TUBAL LIGATION    ? ?Current Outpatient Medications  ?Medication Instructions  ? Cholecalciferol 2,000 Units, Oral, Daily  ? clobetasol ointment (TEMOVATE) 3.08 % 1 application., Topical, 2 times daily  ? Ensure Max Protein (ENSURE MAX PROTEIN) LIQD 11 oz, Oral, 2 times daily  ? lidocaine-prilocaine (EMLA) cream 1 application., Topical,  Once  ? Multiple Vitamin (MULTI-VITAMIN) tablet 1 tablet, Daily  ? oxyCODONE (OXY IR/ROXICODONE) 5 mg, Oral, Every 6 hours PRN  ? pantoprazole (PROTONIX) 40 mg, Oral, Daily  ? predniSONE (DELTASONE) 5 mg, Oral, Daily  ?  promethazine (PHENERGAN) 12.5 mg, Oral, Every 6 hours PRN  ? ? ? ?Family History  ?Problem Relation Age of Onset  ? Heart disease Mother   ? Lung disease Mother   ? ? ?Social History:  reports that she has never smoked. She has never used smokeless tobacco. She reports that she does not drink alcohol and does  not use drugs. ? ? ?Exam: ?Current vital signs: ?BP 107/76 (BP Location: Left Arm)   Pulse (!) 128   Temp 98.6 ?F (37 ?C) (Oral)   Resp 18   Ht '5\' 5"'$  (1.651 m)   Wt 61.7 kg   SpO2 100%   BMI 22.63 kg/m?  ?Vital signs in last 24 hours: ?Temp:  [98.6 ?F (37 ?C)] 98.6 ?F (37 ?C) (04/04 6644) ?Pulse Rate:  [128] 128 (04/04 0347) ?Resp:  [18] 18 (04/04 4259) ?BP: (107)/(76) 107/76 (04/04 5638) ?SpO2:  [100 %] 100 % (04/04 0812) ?Weight:  [61.7 kg] 61.7 kg (04/04 0821) ? ? ?Physical Exam  ?Constitutional: Appears cachectic ?Psych: Affect appropriate to situation, calm and cooperative ?Eyes: No scleral injection ?HENT: No oropharyngeal obstruction.  ?MSK: Rheumatoid arthritis associated joint deformities.  ?Cardiovascular: Tachycardic, perfusing extremities well ?Respiratory: Effort normal, non-labored breathing ?GI: Soft.  No distension.  ?Skin: Chronic skin lesions of the bilateral lower extremities secondary to remote spider bite, no new skin lesions per family ? ?Neuro: ?Mental Status: ?Patient is awake, alert, oriented to person, place, month, year, and situation. ?Patient is able to give a clear and coherent history. ?No signs of aphasia or neglect ?Cranial Nerves: ?II: Visual Fields are full. Pupils are equal, round, and reactive to light.  No current photophobia ?III,IV, VI: EOMI without ptosis or diploplia.  ?V: Facial sensation is symmetric to light touch ?VII: Facial movement is notable for mild right facial droop.  ?VIII: hearing is intact to voice ?X: Uvula elevates symmetrically ?XI: Shoulder shrug is symmetric. ?XII: tongue is midline without atrophy or fasciculations.  ?Motor: ?Tone is normal. Bulk is normal.  Generalized weakness, but no pronator drift of the bilateral upper extremities.  Right leg is briefly antigravity but rapidly falls to the bed, left leg can maintain antigravity for 5 seconds ?Sensory: ?Sensation is symmetric to light touch throughout without double simultaneous  extinction ?Deep Tendon Reflexes: ?2+ and symmetric in the brachioradialis, 3+ in the patellae ?Plantars: ?Toes are upgoing bilaterally.  ?Cerebellar: ?FNF and HKS are intact bilaterally ?Gait:  ?Deferred in acute setting  ? ?NIHSS total 3 ?Score breakdown: One-point for right nasolabial fold flattening and 2 points for right leg weakness ?Performed at 1000 AM  ? ? ?I have reviewed labs in epic and the results pertinent to this consultation are: ? ?Basic Metabolic Panel: ?Recent Labs  ?Lab 04/14/21 ?0845  ?NA 139  ?K 3.4*  ?CL 101  ?CO2 21*  ?GLUCOSE 77  ?BUN 16  ?CREATININE 1.25*  ?CALCIUM 8.5*  ? ? ?CBC: ?Recent Labs  ?Lab 04/14/21 ?0845  ?WBC 19.1*  ?NEUTROABS 17.0*  ?HGB 10.0*  ?HCT 30.7*  ?MCV 90.3  ?PLT 236  ? ? Latest Reference Range & Units 10/29/20 06:30 04/01/21 07:41 04/02/21 06:45 04/03/21 04:51 04/14/21 08:45  ?WBC 4.0 - 10.5 K/uL 16.4 (H) 12.6 (H) 25.3 (H) 17.1 (H) 19.1 (H)  ?RBC 3.87 - 5.11 MIL/uL 2.84 (L) 4.86 3.64 (L) 3.40 (L) 3.40 (L)  ?Hemoglobin 12.0 - 15.0 g/dL 8.9 (L) 14.0 10.7 (L) 9.9 (L) 10.0 (L)  ?HCT 36.0 - 46.0 % 26.7 (L) 43.5 32.0 (L)  29.7 (L) 30.7 (L)  ?MCV 80.0 - 100.0 fL 94.0 89.5 87.9 87.4 90.3  ?MCH 26.0 - 34.0 pg 31.3 28.8 29.4 29.1 29.4  ?MCHC 30.0 - 36.0 g/dL 33.3 32.2 33.4 33.3 32.6  ?RDW 11.5 - 15.5 % 18.5 (H) 14.6 14.6 14.7 16.7 (H)  ?Platelets 150 - 400 K/uL 212 141 (L) 86 (L) 71 (L) 236  ?(H): Data is abnormally high ?(L): Data is abnormally low ? ?Coagulation Studies: ?No results for input(s): LABPROT, INR in the last 72 hours.  ? ?Trop 187 ? ?I have reviewed the images obtained: ? ?Head CT personally reviewed, negative ? ?Impression: Rapidly resolving right-sided weakness.  Differential includes stuttering lacunar stroke or seizure. ? ?Recommendations: ?-MRI brain with and without contrast ?-MRA head without contrast ?-MRA neck with contrast ?-EEG ? ?Lesleigh Noe MD-PhD ?[facility] ?[phone] ?[facility] coverage for [facility] is from 8 AM to 4 AM  in-house and 4 PM to 8 PM by telephone/video. 8 PM to 8 AM emergent questions or overnight urgent questions should be addressed to Teleneurology On-call or [facility] neurohospitalist; contact information can be found on AMION

## 2021-04-14 NOTE — Progress Notes (Signed)
Neurology Progress Note  ? ?Patient ID: Tanya Osborne is a 61 y.o. with a past medical history significant for rheumatoid arthritis on chronic prednisone, autoimmune liver disease, terminal colon cancer receiving hospice care c/b small bowel obstruction s/p colostomy, recent sepsis (04/01/2021, Klebsiella pneumoniae), CKD with acute renal failure and GFR typically less than 30, malnutrition, neuropathy.  ? ? ?Major interval events/Subjective: ?- Having hiccups, pain with swallowing and neuropathic pain  ? ? ?Exam: ?Vitals:  ? 04/14/21 1830 04/14/21 2013  ?BP: 138/86 104/66  ?Pulse: (!) 114 (!) 113  ?Resp: (!) 26 15  ?Temp:  98.7 ?F (37.1 ?C)  ?SpO2: 100% 100%  ? ?Gen: In bed, comfortable  ?Resp: non-labored breathing, no grossly audible wheezing ?Cardiac: Perfusing extremities well  ?Abd: soft, nt ? ?Neuro: ?MS: Awake, alert, appropriately conversant ?CN: Conservation officer, nature well, face symmetric ?Motor: Moves bilateral upper extremities freely antigravity.  Limited spontaneous movement of lower extremities ?Sensory: Painful neuropathy in both hands and feet ?Limited examination given patient discomfort ? ?Pertinent Labs: ? ?Basic Metabolic Panel: ?Recent Labs  ?Lab 04/14/21 ?2025 04/14/21 ?1224  ?NA 139  --   ?K 3.4*  --   ?CL 101  --   ?CO2 21*  --   ?GLUCOSE 77  --   ?BUN 16  --   ?CREATININE 1.25*  --   ?CALCIUM 8.5*  --   ?MG  --  1.4*  ? ? ?CBC: ?Recent Labs  ?Lab 04/14/21 ?0845  ?WBC 19.1*  ?NEUTROABS 17.0*  ?HGB 10.0*  ?HCT 30.7*  ?MCV 90.3  ?PLT 236  ? ? ?Coagulation Studies: ?No results for input(s): LABPROT, INR in the last 72 hours.  ? ?Lab Results  ?Component Value Date  ? CHOL 192 04/15/2021  ? HDL 23 (L) 04/15/2021  ? LDLCALC 141 (H) 04/15/2021  ? TRIG 140 04/15/2021  ? CHOLHDL 8.3 04/15/2021  ? ?No results found for: HGBA1C ? ?ECHO pending ? ?MRI brain, MRA head and neck personally reviewed, agree w/ radiology ?1. Small areas of acute infarct in the right occipital cortex, right ?parietal white matter,  and left frontal parietal white matter. ?Findings suggest small emboli. In addition there is mild to moderate ?chronic microvascular ischemic change in the white matter ?2. No intracranial large vessel occlusion or significant stenosis. ?Mild to moderate stenosis right cavernous carotid ?3. Internal carotid and vertebral arteries are widely patent in the ?neck. Mild to moderate stenosis proximal left external carotid ?artery. ? ?EEG: ?This study is within normal limits. No seizures or epileptiform discharges were seen throughout the recording. ? ? ?Impression: Multifocal strokes etiology cardioembolic vs. Hypercoagulable state in the setting of terminal malignancy.  Discussed with patient and daughter at bedside.  At this time, given her difficulty tolerating oral intake, I am not sure that additional medications will be very useful.  However if patient continues to desire aggressive medical care, would recommend initiating statin and Plavix due to aspirin allergy ? ?Recommendations: ? ?# Multifocal strokes, cardioembolic vs. Hypercoagulable state ?- LDL 141, if patient can tolerate PO, recommend atorvastatin 40 mg QHS ?- If patient desires aggressive care, Plavix 300 mg loading dose followed by 75 mg daily ?- If echocardiogram reveals concern for intracardiac thrombus, would recommend therapeutic subQ Lovenox for anticoagulation as intermittent use of apixaban and can actually increase coagulability and she cannot reliably tolerate oral intake ?- I do not feel that the risks of the TEE would outweigh the benefits given her frailty and functional status at this time ? ?#Pain  control ?-Topical lidocaine may help her neuropathy if she is unable to tolerate oral intake (ordered) ?-Patient's family additionally requesting IV opiates which I defer to primary team ? ? ?Appreciate palliative care continued involvement.  Neurology will be available on an as-needed basis going forward ? ?Lesleigh Noe MD-PhD ?[facility] ?[phone]  ? ?Greater than 35 minutes were spent in care of this patient today, greater than 50% at bedside in discussion with family as above ?  ?

## 2021-04-14 NOTE — Assessment & Plan Note (Addendum)
Patient had hypoxemia probably secondary to acute stroke.  Currently off oxygen.  No short of breath ?

## 2021-04-14 NOTE — Progress Notes (Signed)
SLP Cancellation Note ? ?Patient Details ?Name: Tanya Osborne ?MRN: [id number] ?DOB: 1960/05/19 ? ? ?Cancelled treatment:       Reason Eval/Treat Not Completed:  (chart reviewed; will monitor status next 1-2 days for need for f/u w/ cognitive-language assessment). Per MD, pt and family consulting with palliative care to determine what level/type of care pt wants to receive (pt is enrolled in hospice but remains a full code).  ? ? ? ? ?Orinda Kenner, MS, CCC-SLP ?Speech Language Pathologist ?[facility]; [place] ?[phone] (ascom) ?Kardell Virgil ?04/14/2021, 5:41 PM ?

## 2021-04-14 NOTE — Assessment & Plan Note (Addendum)
Patient still has a very poor appetite, very poor prognosis. ?

## 2021-04-14 NOTE — Assessment & Plan Note (Addendum)
Antibiotics completed. ?

## 2021-04-14 NOTE — Progress Notes (Signed)
? ?      CROSS COVER NOTE ? ?NAME: Tanya Osborne ?MRN: [id number] ?DOB : [birth year] ? ? ? ?Date of Service ?  04/14/2021  ?HPI/Events of Note ?  Patient with episode of emesis and nausea, per family zofran is the only thing that has helped her nausea. qTC on telemetry currently 483  ?Interventions ?  Plan: ?Zofran '4mg'$  IV once ?Continue telemetry monitoring ? ?   ?  ? ?Neomia Glass MHA, MSN, FNP-BC ?Nurse Practitioner ?[facility] ?[place] ?Pager [phone] ? ?

## 2021-04-14 NOTE — Assessment & Plan Note (Addendum)
Stable

## 2021-04-14 NOTE — Assessment & Plan Note (Addendum)
MRI scan showed right occipital and left frontal stroke.  Echocardiogram did not show any blood clots. ?After discussion with the family, patient will not be treated.  She is not able to tolerate any p.o. tablets. ?

## 2021-04-14 NOTE — ED Notes (Signed)
Informed RN bed assigned 

## 2021-04-14 NOTE — Assessment & Plan Note (Addendum)
Received potassium. ?

## 2021-04-14 NOTE — Assessment & Plan Note (Addendum)
-

## 2021-04-14 NOTE — Sepsis Progress Note (Signed)
Sepsis protocol monitored by eLink 

## 2021-04-14 NOTE — ED Notes (Signed)
Critical troponin 187. Malinda MD made aware. ?

## 2021-04-14 NOTE — Procedures (Signed)
Patient Name: Tanya Osborne  ?MRN: [id number]  ?Epilepsy Attending: Lora Havens  ?Referring Physician/Provider: Lorenza Chick, MD ?Date: 04/14/2021 ?Duration: 23.14  mins ? ?Patient history: 61yo F with rapidly resolving right-sided weakness. EEG to evaluate for seizure ? ?Level of alertness: Awake ? ?AEDs during EEG study: None ? ?Technical aspects: This EEG study was done with scalp electrodes positioned according to the 10-20 International system of electrode placement. Electrical activity was acquired at a sampling rate of '500Hz'$  and reviewed with a high frequency filter of '70Hz'$  and a low frequency filter of '1Hz'$ . EEG data were recorded continuously and digitally stored.  ? ?Description: The posterior dominant rhythm consists of 8 Hz activity of moderate voltage (25-35 uV) seen predominantly in posterior head regions, symmetric and reactive to eye opening and eye closing. Physiologic photic driving was not seen during photic stimulation.  Hyperventilation was not performed.    ? ?IMPRESSION: ?This study is within normal limits. No seizures or epileptiform discharges were seen throughout the recording. ? ?Lora Havens  ? ?

## 2021-04-14 NOTE — Progress Notes (Signed)
Eeg done 

## 2021-04-14 NOTE — H&P (Addendum)
?History and Physical  ? ? ?Patient: Tanya Osborne WCH:[id number] DOB: 05/21/1960 ?DOA: 04/14/2021 ?DOS: the patient was seen and examined on 04/14/2021 ?PCP: Annice Needy, MD  ?Patient coming from: Home ? ?Chief Complaint:  ?Chief Complaint  ?Patient presents with  ? Weakness  ? ?HPI: Tanya Osborne is a 61 y.o. female with medical history significant for rheumatoid arthritis on prednisone chronically, hypertension, GERD, primary sclerosing cholangitis, stage IIIb chronic kidney disease, autoimmune liver disease, colon cancer status post colectomy and end ileostomy at [facility] in 02/2020, currently not on chemotherapy and recent hospitalization for Klebsiella pneumonia bacteremia and discharged home with hospice on oral antibiotic therapy. ?She presents to the ER by EMS for evaluation of right-sided weakness.  Patient's last known well time was 9:30 PM the day prior to her admission.  She states that she woke up in the early hours of the morning to use the bathroom but was unable to move her right side.  She could not get to her phone to call her daughter.  Her daughter found her this morning with right-sided weakness and according to her patient was unable to move her right side.  She also had dysarthria and so EMS was called due to concerns for possible stroke.  During my evaluation her right-sided weakness has improved but not completely resolved. ?Per EMS she had room air pulse oximetry of 80% and was placed on 2 L of oxygen with improvement in her symptoms. ?She was discharged home with hospice on 04/04/20 after treatment for Klebsiella bacteremia.  She was discharged home on oral ciprofloxacin and Flagyl.  She discontinued the ciprofloxacin 3 days prior to admission because of severe right-sided temporal headache with photosensitivity.  They attributed this to be a side effect of ciprofloxacin. ?She has had poor oral intake and and is only able to tolerate liquids.  Her daughter states that she is unable to  tolerate solid food. ?She denies having any fever or chills, she denies having any abdominal pain or any urinary symptoms.  She denies having any chest pain, no shortness of breath, no dizziness, no lightheadedness, no difficulty swallowing, no blurred vision. ? ?Review of Systems: As mentioned in the history of present illness. All other systems reviewed and are negative. ?Past Medical History:  ?Diagnosis Date  ? Autoimmune liver disease   ? Cancer [facility])   ? Depression   ? Duodenal ulcer perforation ([place]) 09/08/2008  ? GERD (gastroesophageal reflux disease)   ? H. pylori infection 2010  ? Hypercholesterolemia 2018  ? Iron deficiency anemia   ? Kidney disease, chronic, stage IV (GFR 15-29 ml/min) (HCC)   ? 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 RA (rheumatoid arthritis) ([place])   ? Sepsis ([place])   ? ?Past Surgical History:  ?Procedure Laterality Date  ? INCISION AND DRAINAGE ABSCESS Left 10/21/2020  ? Procedure: INCISION AND DRAINAGE ABSCESS;  Surgeon: Ronny Bacon, MD;  Location: [facility] ORS;  Service: General;  Laterality: Left;  ? PYLOROPLASTY  09/08/2008  ? with omental patch  ? TOTAL COLECTOMY  03/06/2018  ? total abdominal colectomy with end ileostomy  ? TUBAL LIGATION    ? ?Social History:  reports that she has never smoked. She has never used smokeless tobacco. She reports that she does not drink alcohol and does not use drugs. ? ?Allergies  ?Allergen Reactions  ? Nsaids Swelling and Rash  ?  Other Reaction: OTHER REACTION-GI BLEED  ? Penicillins Swelling  ? Percocet [Oxycodone-Acetaminophen] Nausea And Vomiting  ? Celebrex [Celecoxib] Swelling  ?  Cortisone Swelling  ? Gabapentin Swelling  ? Methotrexate Derivatives Swelling  ? Nabumetone Other (See Comments)  ? Tomato Rash  ?  Joint inflamation  ? ? ?Family History  ?Problem Relation Age of Onset  ? Heart disease Mother   ? Lung disease Mother   ? ? ?Prior to Admission medications   ?Medication Sig Start Date End Date Taking? Authorizing Provider   ?oxyCODONE (OXY IR/ROXICODONE) 5 MG immediate release tablet Take 5 mg by mouth every 6 (six) hours as needed for severe pain.   Yes [provider]  ?pantoprazole (PROTONIX) 40 MG tablet Take 40 mg by mouth daily. 04/09/20 04/14/21 Yes [provider]  ?predniSONE (DELTASONE) 5 MG tablet Take 5 mg by mouth daily. 09/22/20  Yes [provider]  ? ? ?Physical Exam: ?Vitals:  ? 04/14/21 0812 04/14/21 0821 04/14/21 0906  ?BP: 107/76    ?Pulse: (!) 128    ?Resp: 18    ?Temp:   98.6 ?F (37 ?C)  ?TempSrc:   Oral  ?SpO2: 100%    ?Weight:  61.7 kg   ?Height:  '5\' 5"'$  (1.651 m)   ? ?Physical Exam ?Vitals and nursing note reviewed.  ?Constitutional:   ?   Comments: Chronically ill-appearing  ?HENT:  ?   Head: Normocephalic and atraumatic.  ?   Nose: Nose normal.  ?   Mouth/Throat:  ?   Mouth: Mucous membranes are dry.  ?Eyes:  ?   Comments: Pale conjunctiva  ?Cardiovascular:  ?   Rate and Rhythm: Tachycardia present.  ?Pulmonary:  ?   Effort: Pulmonary effort is normal.  ?   Breath sounds: Normal breath sounds.  ?Abdominal:  ?   General: Abdomen is flat. Bowel sounds are normal.  ?   Palpations: Abdomen is soft.  ?   Comments: Colostomy bag in place  ?Musculoskeletal:     ?   General: Normal range of motion.  ?   Cervical back: Normal range of motion and neck supple.  ?Skin: ?   General: Skin is warm and dry.  ?Neurological:  ?   Mental Status: She is alert and oriented to person, place, and time.  ?   Comments: Able to raise both lower extremities.  Power in right lower extremity is 3/5 compared to 5 out of 5 for left lower extremity  ?Psychiatric:     ?   Mood and Affect: Mood normal.     ?   Behavior: Behavior normal.  ? ? ?Data Reviewed: ?Relevant notes from primary care and specialist visits, past discharge summaries as available in EHR, including Care Everywhere. ?Prior diagnostic testing as pertinent to current admission diagnoses ?Updated medications and problem lists for reconciliation ?ED  course, including vitals, labs, imaging, treatment and response to treatment ?Triage notes, nursing and pharmacy notes and ED provider's notes ?Notable results as noted in HPI ?Labs reviewed.  Procalcitonin 0.51, lactic acid 1.1, troponin 187, sodium 139, potassium 3.4, chloride 101, bicarb 21, glucose 77, BUN 16, creatinine 1.25, calcium 8.5, total protein 5.9, albumin 2.8, AST 42, ALT 23, alkaline phosphatase 242, total bilirubin 3.6, white count 19.1, hemoglobin 10, hematocrit 30.7, MCV 90.3, RDW 16.7, platelet count 236 ?Chest x-ray reviewed by me shows bilateral pleural effusions ?CT head without contrast shows no evidence of acute hemorrhage ?Twelve-lead EKG reviewed by me shows sinus tachycardia with LVH. ?There are no new results to review at this time. ? ?Assessment and Plan: ?* CVA (cerebral vascular accident) ([place]) ?Patient presents to the  ER for evaluation of right-sided weakness and dysarthria. ?Last known well was 9:30 PM the night prior to admission when she presented to the ED at 8:10 AM. ?Symptoms appear to have improved but not completely resolved ?She was seen in consultation by neurology and stroke work-up is currently in place ?Patient did not receive aspirin due to allergy ?We will place patient on Plavix and statins ?Obtain 2D echocardiogram to assess LVEF and rule out cardiac thrombus ?We will request PT/OT/ST consult ? ? ?UTI (urinary tract infection) ?Patient recently hospitalized for Klebsiella pneumonia bacteremia and presents for evaluation of right-sided weakness. ?She has pyuria ?We will place patient empirically on Rocephin while awaiting results of urine culture ? ?Acute respiratory failure with hypoxia ([place]) ?Unclear etiology but patient was found hypoxic with room air pulse oximetry in the 80s and is currently on 2 L of oxygen with improvement in her pulse oximetry. ?Chest x-ray shows bilateral pleural effusions of unclear etiology. ??  CHF versus malignant effusion ?Continue  oxygen supplementation to maintain pulse oximetry greater than 92%  ? ?History of colon cancer ?Patient has a history of metastatic colon cancer and is currently no longer on chemotherapy. ?She was discharged home with

## 2021-04-14 NOTE — Assessment & Plan Note (Deleted)
Patient has a history of metastatic colon cancer and is currently no longer on chemotherapy. ?She was discharged home with hospice after her last hospitalization. ?Recent CT scan of abdomen and pelvis shows increased omental and peritoneal stranding and nodularity of the left upper quadrant with new loculated fluid collection and upstream so I was able distension of the proximal small bowel, stomach and esophagus. ?Findings likely represent small bowel obstruction secondary to ?worsening peritoneal carcinomatosis. Given infectious symptoms, ?superinfection of fluid collection is a concern, consider ?percutaneous sampling. New mild right hydronephrosis likely secondary to obstruction from right adnexal mass, pyelonephritis or pyonephrosis is a consideration given infectious symptoms. ?Continued  Increased in  size of right adnexal mass, likely peritoneal implant recently discharged sitting up Consults she related to colon cancer. Increased size of subcentimeter retroperitoneal lymph nodes, ?potentially reactive, although metastatic disease is also a concern. ? Mild clustered centrilobular nodules of the right middle lobe, ?likely due to infection or aspiration.Trace bilateral pleural effusions. ?We will reconsult palliative care to discuss goals of care with patient's daughters ?

## 2021-04-14 NOTE — Consult Note (Signed)
? ?                                                                                ?Consultation Note ?Date: 04/14/2021  ? ?Patient Name: Tanya Osborne  ?DOB: Apr 12, 1960  MRN: [id number]  Age / Sex: 61 y.o., female  ?PCP: Annice Needy, MD ?Referring Physician: Collier Bullock, MD ? ?Reason for Consultation: Establishing goals of care ? ?HPI/Patient Profile: 61 y.o. 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  Patient was discharged on 04/04/2021 with hospice at home on oral antibiotics.  Patient admitted on 04/14/2021 with right-sided weakness.  Patient lives with daughter who endorses patient was unable to move her right side and had some dysarthria this morning.  Daughter endorses she spoke with hospice nurse over the phone who advised that a triage nurse would be at her house within 25 minutes.  In the interim, daughter called EMS. Pt presented to ED wherein neurology was consulted.  MRI and EEG pending.  ? ?Clinical Assessment and Goals of Care: ?I have reviewed medical records including EPIC notes, labs and imaging, assessed the patient and then met with Byron, the patient and her two daughters Lesleigh Noe and Latoya to discuss diagnosis prognosis, GOC, EOL wishes, disposition and options. Patient is alert, oriented, and able to participate in discussion.  ? ?I introduced Palliative Medicine as specialized medical care for people living with serious illness. It focuses on providing relief from the symptoms and stress of a serious illness. The goal is to improve quality of life for both the patient and the family. ? ?Therapeutic silence, active listening and space provided for daughters to share their thoughts and emotions regarding their mother's current health  status.  Daughters shared that their mother does not speak much since it tends to make her nauseous. Both daughters endorse patient has had difficulty with nausea and vomiting as well as poor PO intake over the last week, despite use of several different antiemetics.  ? ?We discussed patient's current illness and what it means in the larger context of patient's on-going co-morbidities.  Natural disease trajectory discussed.  I attempted to elicit values and goals of care important to the patient.  Patient shares she wants to stay positive, believes that the [place] can heal her, and does not want to speak of anything negative.   ? ?The difference between aggressive medical intervention and comfort care was outlined in detail.  When asked what her thoughts are on comfort versus medical intervention, the patient says "I want everything you just said".  Both daughters attempted to re-phrase and help clarify that there are 2 different plans of care being discussed.  Both Latoya and [name] support what ever decision their mother wants to make regarding her plan of care. ? ?Advance directives, concepts specific to code status, and rehospitalization were considered and discussed.  We reviewed that repeat hospitalizations will likely continue despite optimal medical interventions.   ? ?I attempted to discuss human mortality and the limitations of medical  interventions to prolong life when the body begins to fail to thrive.  Patient shared she is mourning the loss of her mother who passed less than 1 month ago.  She recalls hospice taking care of her husband when he passed from pancreatic cancer several years ago.  She shares that it is now for time to focus on herself and make herself well.  I shared that the medical team can remain positive and hopeful but also realistic.  Patient is respectful but did not want to speak of topics such as end-of-life plans or code status.  ? ?When asked if patient wants to continue with  medically aggressive treatment and receive any offered, available, and appropriate medical interventions to prolong her life patient did not respond yes or no. Patient's daughter shared that her mother's answer to that is yes.  ? ?Prior to completing our discussion, pt transport entered the room to take patient to EEG. ? ?I spoke with Lorayne Bender and [name] in the hallway of ED, out of earshot of patient. [name] shared that she and her sister Latoya plan to speak with patient regarding what patient would want to have happen should the patient experience another event or "crash". PMT contact info given. Family encouraged to call with any final decisions regarding goals of care or for any further questions/concerns.  ? ?This loving family is facing treatment option decisions and anticipatory care needs. Ongoing support needed for patient and daughters. I shared with patient and family that I am on service tomorrow, I plan to round on her, and will continue to monitor the patient throughout her hospitalization.  ? ?Primary Decision Maker ?PATIENT ? ?Code Status/Advance Care Planning: ?Full code ? ?Prognosis:   ?< 6 months ? ?Discharge Planning: To Be Determined ? ?Primary Diagnoses: ?Present on Admission: ? CVA (cerebral vascular accident) ([place]) ? UTI (urinary tract infection) ? RA (rheumatoid arthritis) ([place]) ? Protein-calorie malnutrition, severe ? CKD (chronic kidney disease) stage 3, GFR 30-59 ml/min (HCC) ? History of colon cancer ? Hypokalemia ? Bacteremia due to Klebsiella pneumoniae ? Acute respiratory failure with hypoxia ([place]) ? ? ?Physical Exam ?Vitals reviewed.  ?Constitutional:   ?   Comments: Frail, thin  ?HENT:  ?   Head: Normocephalic and atraumatic.  ?   Mouth/Throat:  ?   Mouth: Mucous membranes are moist.  ?Eyes:  ?   Pupils: Pupils are equal, round, and reactive to light.  ?Cardiovascular:  ?   Rate and Rhythm: Tachycardia present.  ?   Pulses: Normal pulses.  ?Pulmonary:  ?   Effort: Pulmonary effort  is normal.  ?Abdominal:  ?   Palpations: Abdomen is soft.  ?Musculoskeletal:  ?   Comments: Generalized weakness  ?Skin: ?   General: Skin is warm and dry.  ?   Comments: Port a cath to right chest  ?Neurological:  ?   Mental Status: She is alert and oriented to person, place, and time.  ?Psychiatric:     ?   Mood and Affect: Mood normal.     ?   Behavior: Behavior normal.     ?   Thought Content: Thought content normal.     ?   Judgment: Judgment normal.  ? ? ?Palliative Assessment/Data: 40% ? ? ? ? ?I discussed this patient's plan of care with patient, patient's daughter Lesleigh Noe and Phineas Real, Dr. Francine Graven. ? ?Thank you for this consult. Palliative medicine will continue to follow and assist holistically.  ? ?Time Total: 90 minutes ?Greater than 50%  of this time was spent counseling and coordinating care related to the above assessment and plan. ? ?Signed by: ?Jordan Hawks, DNP, FNP-BC ?Palliative Medicine ? ?  ?Please contact Palliative Medicine Team phone at [phone] for questions and concerns.  ?For individual provider: See Amion ? ? ? ? ? ? ? ? ? ? ? ? ?  ?

## 2021-04-14 NOTE — ED Triage Notes (Signed)
EMS states that they were called out for possible stroke like symptoms. Pt daughter states she was last seen at 2130 last night and was normal but woke around 0730 this am with right side numbness/weakness as well as slurred speech. Pt was also 80's on room air and was placed on 2L O2 and responded well to that.Tanya Osborne ?

## 2021-04-14 NOTE — Progress Notes (Signed)
CODE SEPSIS - PHARMACY COMMUNICATION ? ?**Broad Spectrum Antibiotics should be administered within 1 hour of Sepsis diagnosis** ? ?Time Code Sepsis Called/Page Received: 0165 ? ?Antibiotics Ordered: Cefepime >> changed to ceftriaxone by admitting provider ? ?Time of 1st antibiotic administration: 1237 ? ?Comments: antibiotics delayed as pt was away at MRI ? ? ? ?Sherilyn Banker ,PharmD ?Clinical Pharmacist  ?04/14/2021  11:01 AM ? ?

## 2021-04-14 NOTE — Progress Notes (Addendum)
[facility] ED [facility])   ?    ?This patient is a current hospice patient with [facility], admitted with a terminal diagnosis Carcinoma of the colon. Family contacted (daughter--[name] Dukes: [phone]) and reported that patient continues to have testing completed and admission is unclear at this time.  ? ?Addendum: 3:44 ?Extensive GOC meeting held with family (patient & daughters listed in patient's chart) & PMT provider/Kathryn S., NP. Family offered space to ask questions/present scenarios/etc.  ? ?Family voiced several times that they would like to remain with hospice, but worry that their goals may not align with EOL care. PMT provided extensive education of various alternatives and treatment regimens' including and not including hospice. Family has requested time to discuss as a family what they would like next steps to be as they do not want to continue returning to the hospital, but patient is not ready to 'quit.' ?  ?[facility] will continue to follow for any discharge planning needs and to coordinate continuation of hospice care.  ?   ?Please call with any questions/concerns.  ?  ?Thank you for the opportunity to participate in this patient's care. ? ?Daphene Calamity, MSW ?[facility] Liaison  ?[phone] ? ? ?

## 2021-04-14 NOTE — Progress Notes (Signed)
OT Cancellation Note ? ?Patient Details ?Name: Tanya Osborne ?MRN: [id number] ?DOB: 08-17-1960 ? ? ?Cancelled Treatment:    Reason Eval/Treat Not Completed: Other. Per MD, pt and family consulting with palliative care to determine what level/type of care pt wants to receive (pt is enrolled in hospice but remains a full code). Will evaluate pt for OT services at a later/time date as appropriate, pending family's consultation with palliative care team this afternoon. ? ?Josiah Lobo, PhD, MS, OTR/L ?04/14/21, 2:40 PM ? ?

## 2021-04-14 NOTE — Assessment & Plan Note (Addendum)
This is secondary to acute stroke. ?

## 2021-04-14 NOTE — Progress Notes (Signed)
PHARMACY -  BRIEF ANTIBIOTIC NOTE  ? ?Pharmacy has received consult(s) for aztreonam from an ED provider.  The patient's profile has been reviewed for ht/wt/allergies/indication/available labs. Pt has documented allergy to penicillins but has tolerated cephalosporins in the past. Will change aztreonam to cefepime. ? ?One time order(s) placed for Cefepime 2 g IV ? ?Further antibiotics/pharmacy consults should be ordered by admitting physician if indicated.       ?                ?Thank you, ?Unita Detamore O Steve Youngberg ?04/14/2021  11:00 AM ? ?

## 2021-04-14 NOTE — ED Notes (Addendum)
Fluids, labs as well as abx delayed due to pt being in MRI. ?

## 2021-04-15 DIAGNOSIS — R7881 Bacteremia: Secondary | ICD-10-CM | POA: Diagnosis not present

## 2021-04-15 DIAGNOSIS — Z515 Encounter for palliative care: Secondary | ICD-10-CM

## 2021-04-15 DIAGNOSIS — I639 Cerebral infarction, unspecified: Secondary | ICD-10-CM | POA: Diagnosis not present

## 2021-04-15 DIAGNOSIS — C189 Malignant neoplasm of colon, unspecified: Secondary | ICD-10-CM | POA: Diagnosis not present

## 2021-04-15 DIAGNOSIS — J9601 Acute respiratory failure with hypoxia: Secondary | ICD-10-CM | POA: Diagnosis not present

## 2021-04-15 DIAGNOSIS — N1831 Chronic kidney disease, stage 3a: Secondary | ICD-10-CM

## 2021-04-15 DIAGNOSIS — M069 Rheumatoid arthritis, unspecified: Secondary | ICD-10-CM

## 2021-04-15 DIAGNOSIS — Z85038 Personal history of other malignant neoplasm of large intestine: Secondary | ICD-10-CM

## 2021-04-15 LAB — TROPONIN I (HIGH SENSITIVITY): Troponin I (High Sensitivity): 186 ng/L (ref <18)

## 2021-04-15 LAB — GLUCOSE, CAPILLARY
Glucose-Capillary: 87 mg/dL (ref 70–99)
Glucose-Capillary: 107 mg/dL — ABNORMAL HIGH (ref 70–99)
Glucose-Capillary: 102 mg/dL — ABNORMAL HIGH (ref 70–99)
Glucose-Capillary: 107 mg/dL — ABNORMAL HIGH (ref 70–99)

## 2021-04-15 LAB — PROCALCITONIN: Procalcitonin: 0.49 ng/mL

## 2021-04-15 LAB — LIPID PANEL
Cholesterol: 192 mg/dL (ref 0–200)
HDL: 23 mg/dL — ABNORMAL LOW (ref >40)
LDL Cholesterol: 141 mg/dL — ABNORMAL HIGH (ref 0–99)
Total CHOL/HDL Ratio: 8.3 RATIO
Triglycerides: 140 mg/dL (ref <150)
VLDL: 28 mg/dL (ref 0–40)

## 2021-04-15 MED ORDER — MAGNESIUM SULFATE 4 GM/100ML IV SOLN
4.0000 g | Freq: Once | INTRAVENOUS | Status: AC
  Administered 2021-04-15: 4 g via INTRAVENOUS
  Filled 2021-04-15: qty 100

## 2021-04-15 MED ORDER — OXYCODONE-ACETAMINOPHEN 5-325 MG PO TABS
1.0000 | ORAL_TABLET | ORAL | Status: DC | PRN
  Filled 2021-04-15: qty 1

## 2021-04-15 MED ORDER — OXYCODONE HCL 5 MG PO TABS
5.0000 mg | ORAL_TABLET | ORAL | Status: DC | PRN
  Administered 2021-04-15 – 2021-04-17 (×6): 5 mg via ORAL
  Filled 2021-04-15 (×6): qty 1

## 2021-04-15 MED ORDER — FENTANYL CITRATE PF 50 MCG/ML IJ SOSY
12.5000 ug | PREFILLED_SYRINGE | INTRAMUSCULAR | Status: DC | PRN
Start: 2021-04-15 — End: 2021-04-17
  Administered 2021-04-15 – 2021-04-17 (×8): 12.5 ug via INTRAVENOUS
  Filled 2021-04-15 (×9): qty 1

## 2021-04-15 MED ORDER — ONDANSETRON HCL 4 MG/2ML IJ SOLN
4.0000 mg | Freq: Four times a day (QID) | INTRAMUSCULAR | Status: DC
  Administered 2021-04-15 – 2021-04-16 (×4): 4 mg via INTRAVENOUS
  Filled 2021-04-15 (×4): qty 2

## 2021-04-15 MED ORDER — OXYCODONE HCL 5 MG PO TABS
5.0000 mg | ORAL_TABLET | Freq: Once | ORAL | Status: AC
  Administered 2021-04-15: 5 mg via ORAL
  Filled 2021-04-15: qty 1

## 2021-04-15 MED ORDER — CHLORHEXIDINE GLUCONATE CLOTH 2 % EX PADS
6.0000 | MEDICATED_PAD | Freq: Every day | CUTANEOUS | Status: DC
  Administered 2021-04-15 – 2021-04-17 (×3): 6 via TOPICAL

## 2021-04-15 MED ORDER — LIDOCAINE 4 % EX CREA
TOPICAL_CREAM | Freq: Four times a day (QID) | CUTANEOUS | Status: DC | PRN
  Administered 2021-04-15: 1 via TOPICAL
  Filled 2021-04-15 (×2): qty 5

## 2021-04-15 MED ORDER — ONDANSETRON HCL 4 MG/2ML IJ SOLN
4.0000 mg | Freq: Four times a day (QID) | INTRAMUSCULAR | Status: DC | PRN
  Administered 2021-04-15: 4 mg via INTRAVENOUS
  Filled 2021-04-15: qty 2

## 2021-04-15 NOTE — Progress Notes (Addendum)
[employer]ufacturing [employer] [facility]) Hospitalized Hospice Patient Visit [facility] [phone]) ? ?Tanya Osborne is a current hospice patient with a terminal diagnosis of Carcinoma of the colon. Patient admitted to hospice services, 3.27.23. She presented to the ER by EMS for evaluation of right-sided weakness.  Patient's last known well time was 9:30 PM the day prior to her admission.  She states that she woke up in the early hours of the morning to use the bathroom but was unable to move her right side.  She could not get to her phone to call her daughter.  Her daughter found her this morning with right-sided weakness and according to her patient was unable to move her right side.  She also had dysarthria and so EMS was called due to concerns for possible stroke. Family did notify [facility] of plan to transport to EMS as family reports she was concerned hospice would not be able to have patient back at baseline. Per Dr. Gilford Rile with [facility], this is a related hospital admission.   ? ?Patient reports that she recently received pain medication as she has had ongoing nausea & discomfort during her hospitalization. Patient is alert and oriented but reports that she would like to rest as her pain has been so severe. Patient and family have had ongoing conversations regarding hospice benefit and their desires to continue with hospice services. Family/patient has decided that they would like to continue with services and focus on comfort. Patient remains inpatient appropriate due to symptom management.  ? ?V/S:  ?Temp: 98.1 ?BP: 123/75 ?02: 100% on room air  ?Pulse: 70 ?Resp: 17 ?I/O:   ?240/0 ?Abnormal Labs:  ? Latest Reference Range & Units 04/15/21 00:07  ?Troponin I (High Sensitivity) <18 ng/L 186 (HH)  ?HDL Cholesterol >40 mg/dL 23 (L)  ?LDL (calc) 0 - 99 mg/dL 141 (H)  ?Diagnostics:  ?Pending MD documentation ?IV/PRN:  ?PRN Medications: ?fentaNYL (SUBLIMAZE) injection 12.5 mcg ?Dose: 12.5 mcg ?Freq: Every 2 hours PRN Route:  IV ?PRN Reason: severe pain ?PRN Comment: breakthrough pain ?Start: 04/15/21 1440  ?    ?gadobutrol (GADAVIST) 1 MMOL/ML injection 6 mL ?Dose: 6 mL ?Freq: Once PRN Route: IV ?PRN Reason: contrast ?Start: 04/14/21 1119 End: 04/14/21 1206  ? 1206 ?   ?   ?iohexol (OMNIPAQUE) 350 MG/ML injection 75 mL ?Dose: 75 mL ?Freq: Once PRN Route: IV ?PRN Reason: contrast ?Start: 04/14/21 1243 End: 04/14/21 1249  ? 1249 ?   ?   ?lidocaine (LMX) 4 % cream ?Freq: 4 times daily PRN Route: TP ?PRN Comment: Neuropathy ?Start: 04/15/21 1105  ?  1231 ?   ?  ?ondansetron (ZOFRAN) injection 4 mg ?Dose: 4 mg ?Freq: Every 6 hours PRN Route: IV ?PRN Reasons: nausea,vomiting ?Start: 04/15/21 0946 End: 04/15/21 1439  ?  1023 ? 1439-D/C'd  ?  ?oxyCODONE (Oxy IR/ROXICODONE) immediate release tablet 5 mg ?Dose: 5 mg ?Freq: Every 4 hours PRN Route: PO ?PRN Reason: moderate pain ?Start: 04/15/21 1032  ?  1044 ?   ?  ?oxyCODONE-acetaminophen (PERCOCET/ROXICET) 5-325 MG per tablet 1 tablet ?Dose: 1 tablet ?Freq: Every 4 hours PRN Route: PO ?PRN Reason: moderate pain ?Start: 04/15/21 0945 End: 04/15/21 1032  ?  1032-D/C'd  ?  ?IV Medications: ?0.9 % NaCl with KCl 40 mEq / L infusion ?Rate: 100 mL/hr Freq: Continuous Route: IV ?Start: 04/14/21 1300  ? 1713 ?   ? 0335 ? 0600 ?  ?0800 ? 1200 ?  ?1450 ?   ?  ?aztreonam (AZACTAM) 2  g in sodium chloride 0.9 % 100 mL IVPB ?Dose: 2 g ?Freq:  Once Route: IV ?Start: 04/14/21 1045 End: 04/14/21 1059  ?Order specific questions:  ?Antibiotic Indication: UTI  ? 1059-D/C'd ?(1228) ?   ?   ?ceFEPIme (MAXIPIME) 2 g in sodium chloride 0.9 % 100 mL IVPB ?Dose: 2 g ?Freq:  Once Route: IV ?Start: 04/14/21 1100 End: 04/14/21 1206  ?Order specific questions:  ?Antibiotic Indication: UTI  ? 1206-D/C'd ?(1229) ?   ?   ?cefTRIAXone (ROCEPHIN) 2 g in sodium chloride 0.9 % 100 mL IVPB ?Dose: 2 g ?Freq: Every 24 hours Route: IV ?Last Dose: 2 g (04/15/21 1234) ?Start: 04/14/21 1215  ?Order specific questions:  ?Antibiotic  Indication: Bacteremia  ? 1237 ? 1332 ?  ? 1234 ?   ?  ? lactated ringers bolus 1,000 mL ?Dose: 1,000 mL ?Freq: Once Route: IV ?Last Dose: Stopped (04/14/21 1625) ?Start: 04/14/21 1045 End: 04/14/21 1625  ?Order specific questions:  ?Total Body Weight basis for 30 mL/kg  bolus delivery 61.7 kg  ? 1221 ? 1625 ?  ?   ?And ?lactated ringers bolus 1,000 mL ?Dose: 1,000 mL ?Freq: Once Route: IV ?Last Dose: Stopped (04/14/21 1625) ?Start: 04/14/21 1115 End: 04/14/21 1625  ?Order specific questions:  ?Total Body Weight basis for 30 mL/kg  bolus delivery 61.7 kg  ? 1210 ? 1625 ?  ?   ?lactated ringers infusion ?Rate: 150 mL/hr Freq: Continuous Route: IV ?Start: 04/14/21 1045 End: 04/14/21 1221  ? 1221-D/C'd ?(1238) ?   ?   ?magnesium sulfate IVPB 2 g 50 mL ?Dose: 2 g ?Freq:  Once Route: IV ?Last Dose: Stopped (04/14/21 2000) ?Start: 04/14/21 1615 End: 04/14/21 2000  ? 1712 ? 2000 ?  ?   ? ?ondansetron (ZOFRAN) injection 4 mg ?Dose: 4 mg ?Freq: Every 6 hours Route: IV ?Start: 04/15/21 1800  ? 1800  ?  ? ?Problem List: ? * CVA (cerebral vascular accident) ([place])  ?UTI (urinary tract infection) ?Acute respiratory failure with hypoxia ([place]) ?History of colon cancer ?Patient has a history of metastatic colon cancer and is currently no longer on chemotherapy. ?RA (rheumatoid arthritis) ([place]) ?Elevated troponin ?Bacteremia due to Klebsiella pneumoniae  ?Hypokalemia ?Protein-calorie malnutrition, severe ?CKD (chronic kidney disease) stage 3, GFR 30-59 ml/min (HCC) ?Discharge Planning:  ?Plan is for patient to return home with Hospice services once medically clear.  ?Family Contact:  ?daughter--[name] Dukes: [phone]. Spoke with Lesleigh Noe and family continues to have ongoing conversations regarding [place]. ?IDT:  ?Updated ?Goals of Care:  ?Ongoing. PMT very involved with Hospice staff as conversations continue. ? ?Medication list and transfer summary to be placed on chart. ? ?Addendum: 3:40p ?MSW notified by MD/Dr. Roosevelt Locks that family  has reported to him that they are uncertain if they will return home with hospice services. MD reports that there continues to be ongoing GOCs.  ? ?[facility] will continue to follow for any discharge planning needs and to coordinate continuation of hospice care.  ?   ?Please call with any questions/concerns.  ?  ?Thank you for the opportunity to participate in this patient's care. ? ?Daphene Calamity, MSW ?[facility] Liaison ?[phone] ? ? ?

## 2021-04-15 NOTE — Assessment & Plan Note (Addendum)
Repleted. °

## 2021-04-15 NOTE — Assessment & Plan Note (Addendum)
Patient has end-stage colon  cancer with metastasis to the liver as well as peritoneal carcinomatosis.   ?Condition terminal, patient be discharged home with hospice care.  Life expectancy is probably less than 2 weeks based on intake ?

## 2021-04-15 NOTE — Hospital Course (Addendum)
Tanya Osborne is a 61 y.o. female with medical history significant for rheumatoid arthritis on prednisone chronically, hypertension, GERD, primary sclerosing cholangitis, stage IIIb chronic kidney disease, autoimmune liver disease, colon cancer status post colectomy and end ileostomy at [facility] in 02/2020, currently not on chemotherapy and recent hospitalization for Klebsiella pneumonia bacteremia and discharged home with hospice on oral antibiotic therapy. ?Patient was placed on IV Rocephin.  MRI of the brain showed acute stroke in right occipital lobe and left frontal lobe.  Consistent with embolic stroke. ?Patient condition is terminal, her life expectancy probably is less than 2 weeks.  After long discussion with the family, patient will go home with hospice care. ?

## 2021-04-15 NOTE — Progress Notes (Signed)
PT Cancellation Note ? ?Patient Details ?Name: Tanya Osborne ?MRN: [id number] ?DOB: 05-31-1960 ? ? ?Cancelled Treatment:    Reason Eval/Treat Not Completed: Other (comment). Pt refused PT evaluation, citing she was too tired right now. Also reported nausea, RN notified. PT to re-attempt as able.  ? ?Lieutenant Diego PT, DPT ?2:13 PM,04/15/21 ? ?

## 2021-04-15 NOTE — Progress Notes (Signed)
?Progress Note ? ? ?Patient: Tanya Osborne MCN:[id number] DOB: 03-31-1960 DOA: 04/14/2021     1 ?DOS: the patient was seen and examined on 04/15/2021 ?  ?Brief hospital course: ?Zyliah Schier is a 61 y.o. female with medical history significant for rheumatoid arthritis on prednisone chronically, hypertension, GERD, primary sclerosing cholangitis, stage IIIb chronic kidney disease, autoimmune liver disease, colon cancer status post colectomy and end ileostomy at [facility] in 02/2020, currently not on chemotherapy and recent hospitalization for Klebsiella pneumonia bacteremia and discharged home with hospice on oral antibiotic therapy. ?Patient was placed on IV Rocephin.  MRI of the brain showed acute stroke in right occipital lobe and left frontal lobe.  Consistent with embolic stroke. ? ?Assessment and Plan: ?* CVA (cerebral vascular accident) ([place]) ? MRI scan showed right occipital and left frontal stroke.  Discussed with neurology, obtaining echocardiogram.  Currently concerning for hypercoagulable status.  Due to patient poor prognosis, patient may not benefit from anticoagulation.  Will await for echocardiogram results before make a decision about anticoagulation per neurology. ? ? ?UTI (urinary tract infection) ?Patient is treated with Rocephin. ? ?Acute respiratory failure with hypoxia ([place]) ?Patient had hypoxemia probably secondary to acute stroke.  Currently off oxygen.  No short of breath ? ?RA (rheumatoid arthritis) ([place]) ?Chronic ?On prednisone ? ?Primary colon cancer without distant metastasis (M0) ([place]) ?Patient has end-stage colon  cancer with metastasis to the liver as well as peritoneal carcinomatosis.  She is not a candidate for any additional treatment.  Her condition is terminal. ?Had a long discussion with the patient and family, they are still undecided as for the hospice care.  We will continue discussion. ? ?Hypomagnesemia ?Magnesium 1.4, will give 4 g of magnesium sulfate. ? ?Elevated  troponin ?This is secondary to acute stroke. ? ?Bacteremia due to Klebsiella pneumoniae ?Family still want to complete antibiotics.  At this point, patient would not be able to tolerate the oral antibiotics.  We will continue Rocephin to complete additional 5 days treatment. ? ?Hypokalemia ?Received potassium. ? ?Protein-calorie malnutrition, severe ?Continue protein supplements.  Patient has a very poor appetite. ? ?CKD (chronic kidney disease) stage 3, GFR 30-59 ml/min (HCC) ?Stable ? ? ? ? ? ?  ? ?Subjective:  ?Patient has a very poor appetite, still nauseous with occasional vomiting.  Very little p.o. intake.  She still has some loose stool in the ostomy bag. ? ?Physical Exam: ?Vitals:  ? 04/14/21 2355 04/15/21 0335 04/15/21 0743 04/15/21 1200  ?BP: 112/75 124/72 121/78 123/75  ?Pulse: (!) 104 (!) 107 (!) 117 70  ?Resp: '16 18 17 17  '$ ?Temp: 98.4 ?F (36.9 ?C) 98.3 ?F (36.8 ?C) 98.3 ?F (36.8 ?C) 98.1 ?F (36.7 ?C)  ?TempSrc: Oral Oral Oral Oral  ?SpO2: 100% 100% 100% 100%  ?Weight:      ?Height:      ? ?General exam: Appears calm and comfortable, appears severely malnourished. ?Respiratory system: Clear to auscultation. Respiratory effort normal. ?Cardiovascular system: S1 & S2 heard, RRR. No JVD, murmurs, rubs, gallops or clicks. No pedal edema. ?Gastrointestinal system: Abdomen is nondistended, soft and nontender. No organomegaly or masses felt. Normal bowel sounds heard. ?Central nervous system: Alert and oriented x3. No focal neurological deficits. ?Extremities: Significant muscle atrophy. ?Skin: No rashes, lesions or ulcers ?Psychiatry: Judgement and insight appear normal. Mood & affect appropriate.  ? ?Data Reviewed: ? ?Reviewed prior CT scan, MRI results.  All lab results. ? ?Family Communication: I had a long discussion with patient daughters and patient herself.  All questions answered. ? ?Disposition: ?Status is: Inpatient ?Remains inpatient appropriate because: Unsafe discharge ? Planned Discharge  Destination: Home with Home Health ? ? ? ?Time spent: 36 minutes ? ?Author: ?Sharen Hones, MD ?04/15/2021 3:52 PM ? ?For on call review [email or web address].  ?

## 2021-04-15 NOTE — Progress Notes (Signed)
?                                                   ?  Palliative Care Progress Note, Assessment & Plan  ? ?Patient Name: Tanya Osborne       Date: 04/15/2021 ?DOB: 1960/09/14  Age: 61 y.o. MRN#: [id number] ?Attending Physician: Sharen Hones, MD ?Primary Care Physician: Annice Needy, MD ?Admit Date: 04/14/2021 ? ?Reason for Consultation/Follow-up: Establishing goals of care ? ?Summary of counseling/coordination of care: ?After reviewing the patient's chart, I spoke with the patient's nurse Ovid Curd.  Ovid Curd shares that Zofran is helpful in minimizing patient's nausea.  Zofran changed from as needed to scheduled.  Ovid Curd reports patient had increased pain but that oxycodone IR is helping manage pain now. PRN IV Fentanyl added for breakthrough.  Patient's pain is well controlled with Oxy IR at the moment.  Nursing made aware to use fentanyl IV only for breakthrough. ? ?I counseled with Dr. Roosevelt Locks and Dr. Curly Shores regarding patient's disposition.  Dr. Roosevelt Locks spoke with family and family still undecided on hospice versus palliative services. [place] also spoke with family and family has not decided whether or not to revoke Hospice services.  ? ?Continued discussions regarding [place] needed. Palliative medicine team will continue to follow the patient throughout her hospitalization and continue goals of care discussions. ? ?Thank you for allowing the Palliative Medicine Team to assist in the care of this patient. ? ?Verdell Carmine. Byrl Latin, DNP, FNP-BC ?Palliative Medicine Team ?Team Phone # [phone] ? ?NO CHARGE ?  ?

## 2021-04-15 NOTE — Evaluation (Signed)
Occupational Therapy Evaluation ?Patient Details ?Name: Tanya Osborne ?MRN: [id number] ?DOB: 01-29-1960 ?Today's Date: 04/15/2021 ? ? ?History of Present Illness Tanya Osborne is a 61 y.o. female with medical history significant for rheumatoid arthritis on prednisone chronically, hypertension, GERD, primary sclerosing cholangitis, stage IIIb chronic kidney disease, autoimmune liver disease, colon cancer status post colectomy and end ileostomy at [facility] in 02/2020, currently not on chemotherapy and recent hospitalization for Klebsiella pneumonia bacteremia and discharged home with hospice on oral antibiotic therapy. She presented to the ER by EMS on 04/14/21 for evaluation of right-sided weakness. Patient's last known well time was 9:30 PM the day prior to her admission. She states that she woke up in the early hours of the morning to use the bathroom but was unable to move her right side. She could not get to her phone to call her daughter. Her daughter found her in the morning with right-sided weakness and unable to move her right side. She also had dysarthria. During evaluation in ED, her right-sided weakness had improved but not completely resolved.  ? ?Clinical Impression ?  ?[name] presents with generalized weakness, limited endurance, fatigue, and a decrease in functional mobility. She endorses 10/10 neuropathic pain in b/l hands and LE and complains of nausea. She has several episodes of vomiting during the OT evaluation. RN notified, reports that pt had been given scheduled meds hours earlier and has no orders for PRN pain or nausea meds. No evidence of R-sided weakness today: pt has generalized weakness bilaterally, with grip strength particularly weak, but no b/l asymmetry noted. Pt able to perform fxl mobility tasks -- including bed mobility, transfers, grooming, toileting, w/in-room ambulation, self-feeding -- with Mod I-SUPV, although needed considerable time to complete and required close SUPV for  safety for OOB mobility. Pt engaged in life review as well as initiated a discussion re: her goals of care, stating repeatedly that she was unsure what to do. She clearly was interested in hospice but also felt that "God is going to heal me." Therapist provided therapeutic listening and gentle guidance. At pt's request, discussed goals of care, DC options with daughter Lesleigh Noe. At this point have to recommend [place]. Pt may be interested in hospice care and has unmet needs for comfort care, but neither pt nor daughter seemed to have a good understanding of what hospice could provide, with daughter referring to it as "no care," and just "leaving my mom at home to die." ?Pt has resisted use of DME in the past, but was able to ambulate much more safely today with RW, and seemed to accept that it was helpful for her to use this piece of equipment.   ? ?Recommendations for follow up therapy are one component of a multi-disciplinary discharge planning process, led by the attending physician.  Recommendations may be updated based on patient status, additional functional criteria and insurance authorization.  ? ?Follow Up Recommendations ? Home health OT  ?  ?Assistance Recommended at Discharge Frequent or constant Supervision/Assistance  ?Patient can return home with the following A little help with walking and/or transfers;A little help with bathing/dressing/bathroom;Assistance with cooking/housework;Assist for transportation ? ?  ?Functional Status Assessment ? Patient has had a recent decline in their functional status and demonstrates the ability to make significant improvements in function in a reasonable and predictable amount of time.  ?Equipment Recommendations ? None recommended by OT  ?  ?Recommendations for Other Services Other (comment) (Pt states to OT this morning that she is still unsure about  whether or not she should be enrolled in hospice. Apparently daughter has cancelled hospice enrollment this AM, but pt  still a good candidate for palliative medicine.) ? ? ?  ?Precautions / Restrictions Precautions ?Precautions: Fall ?Precaution Comments: mod fall risk ?Restrictions ?Weight Bearing Restrictions: No  ? ?  ? ?Mobility Bed Mobility ?Overal bed mobility: Needs Assistance ?Bed Mobility: Supine to Sit ?  ?  ?Supine to sit: Supervision, HOB elevated ?  ?  ?General bed mobility comments: greatly increased time, effort for bed mobility ?  ? ?Transfers ?Overall transfer level: Needs assistance ?Equipment used: Rolling walker (2 wheels) ?Transfers: Bed to chair/wheelchair/BSC, Sit to/from Stand ?Sit to Stand: Supervision ?Stand pivot transfers: Supervision ?  ?Step pivot transfers: Supervision ?  ?  ?General transfer comment: Cueing for safe use of RW ?  ? ?  ?Balance Overall balance assessment: Needs assistance ?Sitting-balance support: Feet unsupported, No upper extremity supported ?Sitting balance-Leahy Scale: Good ?  ?  ?Standing balance support: Bilateral upper extremity supported, During functional activity ?Standing balance-Leahy Scale: Good ?Standing balance comment: Improved balance with use of RW ?  ?  ?  ?  ?  ?  ?  ?  ?  ?  ?  ?   ? ?ADL either performed or assessed with clinical judgement  ? ?ADL Overall ADL's : Needs assistance/impaired ?Eating/Feeding: Modified independent ?Eating/Feeding Details (indicate cue type and reason): ablet to feed self and drink, but having difficulty keeping food down, 2/2 N/V ?Grooming: Oral care;Set up;Sitting;Wash/dry face;Supervision/safety ?  ?  ?  ?  ?  ?  ?  ?Lower Body Dressing: Modified independent;Sitting/lateral leans ?Lower Body Dressing Details (indicate cue type and reason): greatly increased time for donning socks, but pt insistant on doing INDly ?Toilet Transfer: Supervision/safety;BSC/3in1;Rolling walker (2 wheels) ?  ?Toileting- Clothing Manipulation and Hygiene: Supervision/safety;Sit to/from stand ?  ?  ?  ?Functional mobility during ADLs:  Supervision/safety;Rolling walker (2 wheels) ?General ADL Comments: close SUPV for safety with OOB fxl mobility  ? ? ? ?Vision Patient Visual Report: No change from baseline ?   ?   ?Perception   ?  ?Praxis   ?  ? ?Pertinent Vitals/Pain Pain Assessment ?Pain Assessment: 0-10 ?Pain Score: 10-Worst pain ever ?Pain Location: hands and feet ?Pain Descriptors / Indicators: Burning, Numbness, Grimacing, Crying ?Pain Intervention(s): Limited activity within patient's tolerance, Patient requesting pain meds-RN notified, Monitored during session, Utilized relaxation techniques, Repositioned  ? ? ? ?Hand Dominance Right ?  ?Extremity/Trunk Assessment Upper Extremity Assessment ?Upper Extremity Assessment: Generalized weakness ?  ?Lower Extremity Assessment ?Lower Extremity Assessment: Generalized weakness ?  ?  ?  ?Communication Communication ?Communication: No difficulties ?  ?Cognition Arousal/Alertness: Awake/alert ?Behavior During Therapy: Flat affect ?Overall Cognitive Status: Within Functional Limits for tasks assessed ?  ?  ?  ?  ?  ?  ?  ?  ?  ?  ?  ?  ?  ?  ?  ?  ?General Comments: A&O x 4 ?  ?  ?General Comments  Pt complains of nausea and pain throughout session, has several episodes of vomitting ? ?  ?Exercises Other Exercises ?Other Exercises: Therapeutic listening; educ re: relaxation techniques to assist with N/V, pain; educ re: safe use of DME; per pt request, spoke with daughter re: goals of care, DC planning. ?  ?Shoulder Instructions    ? ? ?Home Living Family/patient expects to be discharged to:: Private residence ?Living Arrangements: Children (lives with daugther [name]) ?Available Help at Discharge: Family;Available  24 hours/day ?Type of Home: House ?Home Access: Level entry ?  ?  ?Home Layout: One level ?  ?  ?Bathroom Shower/Tub: Tub/shower unit ?  ?Bathroom Toilet: Standard ?  ?  ?Home Equipment: Shower seat;Toilet riser;BSC/3in1;Rolling Environmental consultant (2 wheels) ?  ?  ?  ? ?  ?Prior Functioning/Environment  Prior Level of Function : Needs assist ?  ?  ?  ?  ?  ?  ?Mobility Comments: Pt denies falls, endorses occasionally using a 2WW for functional mobility ?ADLs Comments: Pt endorses that she tries to be as independent as possib

## 2021-04-15 NOTE — Progress Notes (Addendum)
SLP Cancellation Note ? ?Patient Details ?Name: Tanya Osborne ?MRN: [id number] ?DOB: 05-11-1960 ? ? ?Cancelled treatment:       Reason Eval/Treat Not Completed: SLP screened, no needs identified, will sign off (chart reviewed; consulted NSG then met w/ pt and Dtr in room) ?Pt (and Daughter) denied any difficulty swallowing; pt is currently on a regular diet but mostly sips at Glucerna drink supplements d/t having N/V and reduced appetite. Noted N/V episodes reported during OT eval note today also. She tolerates swallowing pills per NSG. Pt conversed in brief conversation w/out overt expressive/receptive deficits noted; pt denied any speech-language deficits. Speech clear. Soft voice. ?No further skilled ST services indicated as pt appears at her baseline re: communication. Pt and Daughter agreed. NSG to reconsult if any change in status while admitted.   ?Noted pt's pain and N/V management per chart notes; Palliative Care/Hospice following following per chart notes.  ? ? ? ? ? ?Orinda Kenner, MS, CCC-SLP ?Speech Language Pathologist ?Rehab Services; [place] ?[phone] (ascom) ?Reinaldo Helt ?04/15/2021, 3:32 PM ?

## 2021-04-16 ENCOUNTER — Inpatient Hospital Stay (HOSPITAL_COMMUNITY)
Admit: 2021-04-16 | Discharge: 2021-04-16 | Disposition: A | Discharge status: Home / Self Care | Attending: Internal Medicine | Admitting: Internal Medicine

## 2021-04-16 DIAGNOSIS — I639 Cerebral infarction, unspecified: Secondary | ICD-10-CM | POA: Diagnosis not present

## 2021-04-16 DIAGNOSIS — Z66 Do not resuscitate: Secondary | ICD-10-CM

## 2021-04-16 DIAGNOSIS — I6389 Other cerebral infarction: Secondary | ICD-10-CM | POA: Diagnosis not present

## 2021-04-16 DIAGNOSIS — C189 Malignant neoplasm of colon, unspecified: Secondary | ICD-10-CM | POA: Diagnosis not present

## 2021-04-16 DIAGNOSIS — M069 Rheumatoid arthritis, unspecified: Secondary | ICD-10-CM | POA: Diagnosis not present

## 2021-04-16 DIAGNOSIS — E43 Unspecified severe protein-calorie malnutrition: Secondary | ICD-10-CM

## 2021-04-16 DIAGNOSIS — B961 Klebsiella pneumoniae [K. pneumoniae] as the cause of diseases classified elsewhere: Secondary | ICD-10-CM | POA: Diagnosis not present

## 2021-04-16 DIAGNOSIS — R7881 Bacteremia: Secondary | ICD-10-CM | POA: Diagnosis not present

## 2021-04-16 LAB — HEMOGLOBIN A1C
Hgb A1c MFr Bld: 4.7 % — ABNORMAL LOW (ref 4.8–5.6)
Mean Plasma Glucose: 88 mg/dL

## 2021-04-16 LAB — ECHOCARDIOGRAM COMPLETE
AR max vel: 3.08 cm2
AV Area VTI: 2.86 cm2
AV Area mean vel: 2.53 cm2
AV Mean grad: 2.5 mmHg
AV Peak grad: 4.4 mmHg
Ao pk vel: 1.05 m/s
Area-P 1/2: 7.29 cm2
Height: 68 in
S' Lateral: 2.8 cm
Weight: 1996.49 oz

## 2021-04-16 LAB — APTT: aPTT: 33 seconds (ref 24–36)

## 2021-04-16 LAB — PROTIME-INR
INR: 1.2 (ref 0.8–1.2)
Prothrombin Time: 15.3 seconds — ABNORMAL HIGH (ref 11.4–15.2)

## 2021-04-16 LAB — PROCALCITONIN: Procalcitonin: 0.46 ng/mL

## 2021-04-16 LAB — GLUCOSE, CAPILLARY: Glucose-Capillary: 104 mg/dL — ABNORMAL HIGH (ref 70–99)

## 2021-04-16 MED ORDER — ENSURE ENLIVE PO LIQD: 237.0000 mL | Freq: Three times a day (TID) | ORAL | Status: DC

## 2021-04-16 MED ORDER — ADULT MULTIVITAMIN W/MINERALS CH
1.0000 | ORAL_TABLET | Freq: Every day | ORAL | Status: DC
  Filled 2021-04-16 (×2): qty 1

## 2021-04-16 MED ORDER — GLUCERNA SHAKE PO LIQD
237.0000 mL | Freq: Three times a day (TID) | ORAL | Status: DC
  Administered 2021-04-16 – 2021-04-17 (×2): 237 mL via ORAL

## 2021-04-16 MED ORDER — ONDANSETRON HCL 4 MG/2ML IJ SOLN
4.0000 mg | Freq: Four times a day (QID) | INTRAMUSCULAR | Status: DC | PRN
  Administered 2021-04-16 – 2021-04-17 (×3): 4 mg via INTRAVENOUS
  Filled 2021-04-16 (×3): qty 2

## 2021-04-16 MED ORDER — GUAIFENESIN-DM 100-10 MG/5ML PO SYRP
5.0000 mL | ORAL_SOLUTION | ORAL | Status: DC | PRN
Start: 2021-04-16 — End: 2021-04-17
  Administered 2021-04-16: 5 mL via ORAL
  Filled 2021-04-16: qty 10

## 2021-04-16 NOTE — Progress Notes (Signed)
Initial Nutrition Assessment ? ?DOCUMENTATION CODES:  ? ?Not applicable ? ?INTERVENTION:  ? ?-Liberalize diet to regular ?-MVI with minerals daily ?-Ensure Enlive po TID, each supplement provides 350 kcal and 20 grams of protein ?-D/c Glucerna ? ?NUTRITION DIAGNOSIS:  ? ?Increased nutrient needs related to chronic illness (colon cancer) as evidenced by estimated needs. ? ?GOAL:  ? ?Patient will meet greater than or equal to 90% of their needs ? ?MONITOR:  ? ?PO intake, Supplement acceptance, Labs, Weight trends, Skin, I & O's ? ?REASON FOR ASSESSMENT:  ? ?Malnutrition Screening Tool ?  ? ?ASSESSMENT:  ? ?Tanya Osborne is a 61 y.o. female with medical history significant for rheumatoid arthritis on prednisone chronically, hypertension, GERD, primary sclerosing cholangitis, stage IIIb chronic kidney disease, autoimmune liver disease, colon cancer status post colectomy and end ileostomy at [facility] in 02/2020, currently not on chemotherapy and recent hospitalization for Klebsiella pneumonia bacteremia and discharged home with hospice on oral antibiotic therapy. ? ?Pt admitted with CVA and UTI.  ? ?Reviewed I/O's: +2.6 L x 24 hours and +4.5 L since admission ? ?UOP: 0 ml x 24 hours  ? ?Pt in with MD at time of visit.  ? ?Case discussed with SLP; pt with nausea and vomiting which is limiting her PO intake. Pt is taking bites and sips, but most intake is from vanilla Glucerna. Noted meal completions 25%.  ? ?Reviewed wt hx; pt has experienced a 30% wt loss over the past 3 months, which is significant for time frame.  ? ?Highly suspect pt with malnutrition, however, unable to identify at this time.  ? ?Per MD notes, plan to discharge home with hospice tomorrow. Palliative care following.  ? ?Medications reviewed and include prednisone.  ? ? ?Lab Results  ?Component Value Date  ? HGBA1C 4.7 (L) 04/15/2021  ? PTA DM medications are none.  ? ?Labs reviewed: K: 3.4, Phos: 2.0, Mg: 1.4, CBGS: 87-104 (inpatient orders for glycemic  control are none).   ? ?Diet Order:   ?Diet Order   ? ?       ?  Diet heart healthy/carb modified Room service appropriate? Yes; Fluid consistency: Thin  Diet effective now       ?  ? ?  ?  ? ?  ? ? ?EDUCATION NEEDS:  ? ?No education needs have been identified at this time ? ?Skin:  Skin Assessment: Reviewed RN Assessment ? ?Last BM:  Unknown ? ?Height:  ? ?Ht Readings from Last 1 Encounters:  ?04/14/21 '5\' 8"'$  (1.727 m)  ? ? ?Weight:  ? ?Wt Readings from Last 1 Encounters:  ?04/14/21 56.6 kg  ? ? ?Ideal Body Weight:  63.6 kg ? ?BMI:  Body mass index is 18.97 kg/m?. ? ?Estimated Nutritional Needs:  ? ?Kcal:  1800-2000 ? ?Protein:  100-115 grams ? ?Fluid:  > 1.8 L ? ? ? ?Loistine Chance, RD, LDN, CDCES ?Registered Dietitian II ?Certified Diabetes Care and Education Specialist ?Please refer to [facility] for RD and/or RD on-call/weekend/after hours pager  ?

## 2021-04-16 NOTE — Progress Notes (Signed)
?                                                   ?  Palliative Care Progress Note, Assessment & Plan  ? ?Patient Name: Tanya Osborne       Date: 04/16/2021 ?DOB: 01/20/1960  Age: 61 y.o. MRN#: [id number] ?Attending Physician: Sharen Hones, MD ?Primary Care Physician: Annice Needy, MD ?Admit Date: 04/14/2021 ? ?Reason for Consultation/Follow-up: Establishing goals of care ? ?Subjective: ?Patient is sitting in bed in no apparent distress.  She denies nausea or vomiting at this time.  She says that when she is talks too much she becomes nauseous.  She deferred all decision making to her daughters and asked that I speak with them. No family at bedside.  ? ?Summary of counseling/coordination of care: ?After reviewing the patient's chart and assessing the patient at bedside, I counseled with hospice liaison Misty. As per her discussions with patient and family this morning, patient and family have decided to discharge home with hospice services tomorrow, 4/7. DNR remains. ? ?I discussed nausea and pain control medications with patient's nurse.  Patient's nurse feels as though Zofran, as needed oxycodone, and as needed IV fentanyl are appropriately managing patient's pain at this moment.  Notified nursing that should these medications not adequately manage patient's symptoms then to please reach out to either attending or palliative medicine team. ? ?Palliative medicine team will shadow the patient's chart and intervene at patient/family or medical teams request, if goals change, or if patient's health status declines. ? ?Code Status: ?DNR ? ?Prognosis: ?< 6 months ? ?Discharge Planning: ?Home with Hospice ? ?Care plan was discussed with patient, nursing, hospice Liaison Misty, Dr. Roosevelt Locks ? ?Physical Exam ?Vitals and nursing note reviewed.  ?Constitutional:   ?   Comments: Frail, thin   ?HENT:  ?   Head: Normocephalic.  ?   Mouth/Throat:  ?   Mouth: Mucous membranes are moist.  ?Eyes:  ?   Pupils: Pupils are equal, round, and reactive to light.  ?Cardiovascular:  ?   Rate and Rhythm: Normal rate.  ?   Pulses: Normal pulses.  ?Pulmonary:  ?   Effort: Pulmonary effort is normal.  ?Musculoskeletal:  ?   Comments: Generalized weakness  ?Skin: ?   General: Skin is warm and dry.  ?Neurological:  ?   Mental Status: She is alert. Mental status is at baseline.  ?         ? ?Palliative Assessment/Data: 30% ? ? ? ?Total Time 25 minutes  ?Greater than 50%  of this time was spent counseling and coordinating care related to the above assessment and plan. ? ?Thank you for allowing the Palliative Medicine Team to assist in the care of this patient. ? ?Verdell Carmine. Cissy Galbreath, DNP, FNP-BC ?Palliative Medicine Team ?Team Phone # [phone] ?  ?

## 2021-04-16 NOTE — Plan of Care (Signed)
?  Problem: Education: ?Goal: Knowledge of disease or condition will improve ?04/16/2021 0335 by Mamie Levers, RN ?Outcome: Progressing ?04/16/2021 0334 by Mamie Levers, RN ?Outcome: Progressing ?Goal: Knowledge of secondary prevention will improve (SELECT ALL) ?04/16/2021 0335 by Mamie Levers, RN ?Outcome: Progressing ?04/16/2021 0334 by Mamie Levers, RN ?Outcome: Progressing ?Goal: Knowledge of patient specific risk factors will improve (INDIVIDUALIZE FOR PATIENT) ?04/16/2021 0335 by Mamie Levers, RN ?Outcome: Progressing ?04/16/2021 0334 by Mamie Levers, RN ?Outcome: Progressing ?  ?

## 2021-04-16 NOTE — Progress Notes (Signed)
[place] [facility] [address] [facility]) Hospitalized Hospice  ?  ?Tanya Osborne is a current hospice patient with a terminal diagnosis of Carcinoma of the colon. Patient admitted to hospice services, 3.27.23. She presented to the ER by EMS for evaluation of right-sided weakness.  Per Dr. Gilford Rile with [facility], this is a related hospital admission.  ?  ?Visited at bedside and exchanged report with MD, bedside nurse, and PMT. Patient is awake and alert but only talks if asked a direct question. Her 2 daughters are present and confirmed that it is their plan for her to return home tomorrow after her IV antibiotics. Patient complains of pain to her right side and this was reported to nurse.  ?  ?V/S: 98.5/107/20  110/72   spO2  100% room air ?I&O: 240/200 ?Diagnostics:  ?ECHOCARDIOGRAM REPORT    ? ?Patient Name:   Tanya Osborne Date of Exam: 04/16/2021  ?Medical Rec #:  [id number]       Height:       68.0 in  ?Accession #:    [id number]      Weight:       124.8 lb  ?Date of Birth:  [birth year]       BSA:          1.672 m?  ?Patient Age:    61 years        BP:           126/82 mmHg  ?Patient Gender: F               HR:           108 bpm.  ?Exam Location:  [place]  ? ?Procedure: 2D Echo, Cardiac Doppler and Color Doppler  ? ?FINDINGS  ? Left Ventricle: Left ventricular ejection fraction, by estimation, is 60  ?to 65%. The left ventricle has normal function. The left ventricle has no  ?regional wall motion abnormalities. The left ventricular internal cavity  ?size was normal in size. There is  ? no left ventricular hypertrophy. Left ventricular diastolic parameters  ?are consistent with Grade I diastolic dysfunction (impaired relaxation).  ? ?Right Ventricle: The right ventricular size is normal. No increase in  ?right ventricular wall thickness. Right ventricular systolic function is  ?normal.  ? ?Left Atrium: Left atrial size was normal in size.  ? ?Right Atrium: Right atrial size was normal in size.   ? ?Pericardium: There is no evidence of pericardial effusion.  ? ?Mitral Valve: The mitral valve is normal in structure. Mild mitral valve  ?regurgitation. No evidence of mitral valve stenosis.  ? ?Tricuspid Valve: The tricuspid valve is normal in structure. Tricuspid  ?valve regurgitation is mild . No evidence of tricuspid stenosis.  ? ?Aortic Valve: The aortic valve is normal in structure. Aortic valve  ?regurgitation is not visualized. Aortic valve sclerosis is present, with  ?no evidence of aortic valve stenosis. Aortic valve mean gradient measures  ?2.5 mmHg. Aortic valve peak gradient  ?measures 4.4 mmHg. Aortic valve area, by VTI measures 2.86 cm?.  ? ?Pulmonic Valve: The pulmonic valve was normal in structure. Pulmonic valve  ?regurgitation is not visualized. No evidence of pulmonic stenosis.  ? ?Aorta: The aortic root is normal in size and structure.  ? ?Venous: The inferior vena cava is normal in size with greater than 50%  ?respiratory variability, suggesting right atrial pressure of 3 mmHg.  ? ?IAS/Shunts: No atrial level shunt detected by color flow Doppler.  ? ?   ?Ida Rogue  MD  ? ?Labs: None new ? ?IV/PRN: Rocephin 1 gram q24 hours, Fentanyl 12.'5mg'$  q2 hours PRN x 5, Oxycodone '5mg'$  q4 hours prn x2 ?  ?Problem List: ?* CVA (cerebral vascular accident) ([place]) ? MRI scan showed right occipital and left frontal stroke.  Had a long discussion with the family, given patient very poor prognosis, will not consider any additional treatment.  Especially patient will not be given any anticoagulation. ?  ?UTI (urinary tract infection) ?Patient is treated with Rocephin. ?  ?Acute respiratory failure with hypoxia ([place]) ?Patient had hypoxemia probably secondary to acute stroke.  Currently off oxygen.  No short of breath ?  ?RA (rheumatoid arthritis) ([place]) ?Continue prednisone. ?  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   ?Had a long discussion with patient and her daughters.  They are agreeable for DO NOT RESUSCITATE status. ?Patient probably has a life expectancy of less than 1 month, the agreeable to go home tomorrow with hospice care. ?But they want to complete the final dose of Rocephin tomorrow before discharge, so that she will not require any additional antibiotics.  ? ?GOC: Remains ongoing, needs continued follow up ?D/C planning: Plan to discharge tomorrow after last IV antibiotic dose ?Family: daughters in room ?IDT: Hospice team updated ? ?Please don't hesitate to call for any hospice related questions or concerns.  ?Tanya Osborne, BSN, Therapist, sports, [employer] ?Hospice hospital liaison ?[phone] ?

## 2021-04-16 NOTE — Evaluation (Signed)
Physical Therapy Evaluation ?Patient Details ?Name: Tanya Osborne ?MRN: [id number] ?DOB: 06/07/1960 ?Today's Date: 04/16/2021 ? ?History of Present Illness ? Tanya Osborne is a 61 y.o. female with medical history significant for rheumatoid arthritis on prednisone chronically, hypertension, GERD, primary sclerosing cholangitis, stage IIIb chronic kidney disease, autoimmune liver disease, colon cancer status post colectomy and end ileostomy at [facility] in 02/2020, currently not on chemotherapy and recent hospitalization for Klebsiella pneumonia bacteremia and discharged home with hospice on oral antibiotic therapy. She presented to the ER by EMS on 04/14/21 for evaluation of right-sided weakness. Patient's last known well time was 9:30 PM the day prior to her admission. She states that she woke up in the early hours of the morning to use the bathroom but was unable to move her right side. She could not get to her phone to call her daughter. Her daughter found her in the morning with right-sided weakness and unable to move her right side. She also had dysarthria. During evaluation in ED, her right-sided weakness had improved but not completely resolved. ?  ?Clinical Impression ? Patient alert, agreeable to PT to address ADLs this AM, reported R side/flank pain throughout, exhibited moderate pain signs/symptoms, RN aware. Session focused on mobility, but per pt/chart review, she lives with family who are available 24/7 to assist as needed, but pt attempts to be as independent as possible. ? ?The patient demonstrated all mobility with extended time, very little physical assist truly needed. She was able to sit <> stand 3 times during the session, CGA with RW. She ambulated ~4f twice, and performed pericare in standing with supervision. Pt endorsed fatigue throughout PT provided comfort and active listening. Repositioned in bed with all needs in reach at end of session.  Overall the patient demonstrated deficits (see "PT  Problem List") that impede the patient's functional abilities, safety, and mobility and would benefit from skilled PT intervention. PT follow discussed with pt, agreeable as long as the therapist was not too "aggressive" due to her fatigue. Recommendation is HHPT with 24/7 supervision/assistance, and could potentially benefit from a WC with elevating leg rests and cushion, as well as a hospital bed. ? ? ?Patient suffers from weakness, CKD, autoimmune liver disease, colon cancer, which impairs his/her ability to perform daily activities like toileting, feeding, dressing, grooming, bathing in the home. A cane, walker, crutch will not resolve the patient's issue with performing activities of daily living. A lightweight wheelchair and cushion is required/recommended and will allow patient to safely perform daily activities. ?  ?Patient can safely propel the wheelchair in the home or has a caregiver who can provide assistance.  ? ?   ?   ? ?Recommendations for follow up therapy are one component of a multi-disciplinary discharge planning process, led by the attending physician.  Recommendations may be updated based on patient status, additional functional criteria and insurance authorization. ? ?Follow Up Recommendations Home health PT ? ?  ?Assistance Recommended at Discharge Frequent or constant Supervision/Assistance  ?Patient can return home with the following ? A lot of help with walking and/or transfers;A lot of help with bathing/dressing/bathroom;Assistance with feeding;Assist for transportation;Assistance with cooking/housework;Help with stairs or ramp for entrance;Direct supervision/assist for medications management ? ?  ?Equipment Recommendations Hospital bed;Wheelchair cushion (measurements PT);Wheelchair (measurements PT)  ?Recommendations for Other Services ?    ?  ?Functional Status Assessment Patient has had a recent decline in their functional status and demonstrates the ability to make significant  improvements in function in a reasonable and  predictable amount of time.  ? ?  ?Precautions / Restrictions Precautions ?Precautions: Fall ?Precaution Comments: mod fall risk ?Restrictions ?Weight Bearing Restrictions: No  ? ?  ? ?Mobility ? Bed Mobility ?Overal bed mobility: Needs Assistance ?Bed Mobility: Supine to Sit ?  ?  ?Supine to sit: Supervision, HOB elevated ?  ?  ?General bed mobility comments: greatly increased time, effort for bed mobility ?  ? ?Transfers ?Overall transfer level: Needs assistance ?Equipment used: Rolling walker (2 wheels) ?Transfers: Bed to chair/wheelchair/BSC, Sit to/from Stand ?Sit to Stand: Min guard ?Stand pivot transfers: Supervision ?Step pivot transfers: Supervision ?  ?  ?  ?General transfer comment: Cueing for safe use of RW ?  ? ?Ambulation/Gait ?  ?Gait Distance (Feet):  (26f twice, 531f ?Assistive device: Rolling walker (2 wheels) ?  ?  ?  ?  ?  ? ?Stairs ?  ?  ?  ?  ?  ? ?Wheelchair Mobility ?  ? ?Modified Rankin (Stroke Patients Only) ?  ? ?  ? ?Balance Overall balance assessment: Needs assistance ?Sitting-balance support: Feet unsupported, No upper extremity supported ?Sitting balance-Leahy Scale: Good ?  ?  ?Standing balance support: Bilateral upper extremity supported, During functional activity ?Standing balance-Leahy Scale: Good ?Standing balance comment: Improved balance with use of RW ?  ?  ?  ?  ?  ?  ?  ?  ?  ?  ?  ?   ? ? ? ?Pertinent Vitals/Pain Pain Assessment ?Pain Assessment: Faces ?Faces Pain Scale: Hurts little more ?Pain Location: hands and feet, R side ?Pain Descriptors / Indicators: Burning, Numbness, Grimacing, Crying ?Pain Intervention(s): Limited activity within patient's tolerance, Repositioned, Premedicated before session, Monitored during session  ? ? ?Home Living Family/patient expects to be discharged to:: Private residence ?Living Arrangements: Children (lives with daugther Tanya Osborne) ?Available Help at Discharge: Family;Available 24  hours/day ?Type of Home: House ?Home Access: Level entry ?  ?  ?  ?Home Layout: One level ?Home Equipment: Shower seat;Toilet riser;BSC/3in1;Rolling WaEnvironmental consultant2 wheels) ?   ?  ?Prior Function Prior Level of Function : Needs assist ?  ?  ?  ?  ?  ?  ?Mobility Comments: Pt denies falls, endorses occasionally using a 2WW for functional mobility ?ADLs Comments: Pt endorses that she tries to be as independent as possible. Family assist with IADL management/driving. Pt states she does not leave her home much. ?  ? ? ?Hand Dominance  ? Dominant Hand: Right ? ?  ?Extremity/Trunk Assessment  ? Upper Extremity Assessment ?Upper Extremity Assessment: Generalized weakness ?  ? ?Lower Extremity Assessment ?Lower Extremity Assessment: Generalized weakness ?  ? ?Cervical / Trunk Assessment ?Cervical / Trunk Assessment: Kyphotic  ?Communication  ? Communication: No difficulties  ?Cognition Arousal/Alertness: Awake/alert ?Behavior During Therapy: Flat affect, Anxious ?Overall Cognitive Status: Within Functional Limits for tasks assessed ?  ?  ?  ?  ?  ?  ?  ?  ?  ?  ?  ?  ?  ?  ?  ?  ?General Comments: A&O x 4 ?  ?  ? ?  ?General Comments   ? ?  ?Exercises Other Exercises ?Other Exercises: pericare in standing with supervision  ? ?Assessment/Plan  ?  ?PT Assessment Patient needs continued PT services  ?PT Problem List Decreased strength;Decreased mobility;Decreased activity tolerance;Decreased balance;Decreased knowledge of use of DME;Decreased safety awareness ? ?   ?  ?PT Treatment Interventions DME instruction;Therapeutic exercise;Gait training;Balance training;Stair training;Neuromuscular re-education;Therapeutic activities;Patient/family education   ? ?PT Goals (  Current goals can be found in the Care Plan section)  ?Acute Rehab PT Goals ?Patient Stated Goal: to rest, recover, limit pain ?PT Goal Formulation: With patient ?Time For Goal Achievement: 04/30/21 ?Potential to Achieve Goals: Fair ? ?  ?Frequency Min 2X/week ?   ? ? ?Co-evaluation   ?  ?  ?  ?  ? ? ?  ?AM-PAC PT "6 Clicks" Mobility  ?Outcome Measure Help needed turning from your back to your side while in a flat bed without using bedrails?: None ?Help needed moving from lying on your back

## 2021-04-16 NOTE — Progress Notes (Signed)
PT Cancellation Note ? ?Patient Details ?Name: Tanya Osborne ?MRN: [id number] ?DOB: November 17, 1960 ? ? ?Cancelled Treatment:    Reason Eval/Treat Not Completed: Other (comment). Per MD, PT to sign off at this time due to pt plan to discharge home with hospice.  ? ?Lieutenant Diego PT, DPT ?11:24 AM,04/16/21 ? ?

## 2021-04-16 NOTE — Plan of Care (Signed)
?  Problem: Education: ?Goal: Knowledge of disease or condition will improve ?Outcome: Progressing ?Goal: Knowledge of secondary prevention will improve (SELECT ALL) ?Outcome: Progressing ?Goal: Knowledge of patient specific risk factors will improve (INDIVIDUALIZE FOR PATIENT) ?Outcome: Progressing ?  ?

## 2021-04-16 NOTE — Progress Notes (Signed)
?Progress Note ? ? ?Patient: Tanya Osborne XLK:[id number] DOB: [birth year] DOA: 04/14/2021     2 ?DOS: the patient was seen and examined on 04/16/2021 ?  ?Brief hospital course: ?Phala Schraeder is a 61 y.o. female with medical history significant for rheumatoid arthritis on prednisone chronically, hypertension, GERD, primary sclerosing cholangitis, stage IIIb chronic kidney disease, autoimmune liver disease, colon cancer status post colectomy and end ileostomy at [facility] in 02/2020, currently not on chemotherapy and recent hospitalization for Klebsiella pneumonia bacteremia and discharged home with hospice on oral antibiotic therapy. ?Patient was placed on IV Rocephin.  MRI of the brain showed acute stroke in right occipital lobe and left frontal lobe.  Consistent with embolic stroke. ? ?Assessment and Plan: ?* CVA (cerebral vascular accident) ([place]) ? MRI scan showed right occipital and left frontal stroke.  Had a long discussion with the family, given patient very poor prognosis, will not consider any additional treatment.  Especially patient will not be given any anticoagulation. ? ?UTI (urinary tract infection) ?Patient is treated with Rocephin. ? ?Acute respiratory failure with hypoxia ([place]) ?Patient had hypoxemia probably secondary to acute stroke.  Currently off oxygen.  No short of breath ? ?RA (rheumatoid arthritis) ([place]) ?Continue prednisone. ? ?Primary colon cancer without distant metastasis (M0) ([place]) ?Patient has end-stage colon  cancer with metastasis to the liver as well as peritoneal carcinomatosis.   ?Had a long discussion with patient and her daughters.  They are agreeable for DO NOT RESUSCITATE status. ?Patient probably has a life expectancy of less than 1 month, the agreeable to go home tomorrow with hospice care. ?But they want to complete the final dose of Rocephin tomorrow before discharge, so that she will not require any additional antibiotics. ? ?Hypomagnesemia ?Repleted.   ? ?Elevated  troponin ?This is secondary to acute stroke. ? ?Bacteremia due to Klebsiella pneumoniae ?Patient will be given 1 more days of IV Rocephin tomorrow per family request. ? ?Hypokalemia ?Received potassium. ? ?Protein-calorie malnutrition, severe ?Patient still has a very poor appetite, bar poor prognosis. ? ?CKD (chronic kidney disease) stage 3, GFR 30-59 ml/min (HCC) ?Stable ? ? ? ? ? ?  ? ?Subjective:  ?Patient still has a very poor appetite, with occasional nausea.  No short of breath. ? ?Physical Exam: ?Vitals:  ? 04/15/21 2000 04/16/21 0021 04/16/21 0401 04/16/21 0750  ?BP: 114/76 127/82 126/82 110/72  ?Pulse: (!) 118 (!) 121 (!) 108 (!) 107  ?Resp: '18 20 18 20  '$ ?Temp: 98.4 ?F (36.9 ?C) 98.2 ?F (36.8 ?C) 98.4 ?F (36.9 ?C) 98.5 ?F (36.9 ?C)  ?TempSrc:   Oral   ?SpO2: 100% 100% 100% 100%  ?Weight:      ?Height:      ? ?General exam: Appears calm and comfortable, appear cachectic, severely malnourished. ?Respiratory system: Clear to auscultation. Respiratory effort normal. ?Cardiovascular system: S1 & S2 heard, RRR. No JVD, murmurs, rubs, gallops or clicks. ?Gastrointestinal system: Abdomen is nondistended, soft and nontender. No organomegaly or masses felt. Normal bowel sounds heard. ?Central nervous system: Alert and oriented x2. No focal neurological deficits. ?Extremities: 1+ edema ?Skin: No rashes, lesions or ulcers ?Psychiatry: Judgement and insight appear normal. Mood & affect appropriate.  ? ?Data Reviewed: ? ?Lab results reviewed again. ? ?Family Communication: Had a long discussion with patient and her daughters, documented above ? ?Disposition: ?Status is: Inpatient ?Remains inpatient appropriate because: Unsafe discharge, will discharge tomorrow ? Planned Discharge Destination:  Home with hospice ? ? ? ?Time spent: 48 minutes ? ?Author: Danford Bad  Roosevelt Locks, MD ?04/16/2021 10:54 AM ? ?For on call review [email or web address].  ?

## 2021-04-16 NOTE — Progress Notes (Signed)
*  PRELIMINARY RESULTS* ?Echocardiogram ?2D Echocardiogram has been performed. ? ?[name], Sonia Side ?04/16/2021, 7:46 AM ?

## 2021-04-17 DIAGNOSIS — E43 Unspecified severe protein-calorie malnutrition: Secondary | ICD-10-CM

## 2021-04-17 MED ORDER — GUAIFENESIN-DM 100-10 MG/5ML PO SYRP: 5.0000 mL | ORAL_SOLUTION | ORAL | 0 refills | Status: AC | PRN

## 2021-04-17 MED ORDER — MORPHINE SULFATE 20 MG/5ML PO SOLN: 2.5000 mg | ORAL | 0 refills | Status: AC | PRN

## 2021-04-17 NOTE — TOC Transition Note (Signed)
Transition of Care (TOC) - CM/SW Discharge Note ? ? ?Patient Details  ?Name: Tanya Osborne ?MRN: [id number] ?Date of Birth: [birth year] ? ?Transition of Care (TOC) CM/SW Contact:  ?Alberteen Sam, LCSW ?Phone Number: ?04/17/2021, 9:47 AM ? ? ?Clinical Narrative:    ? ?Patient will discharge home with [facility] today. Per Lorayne Bender with hospice, family will transport patient home.  ?No further discharge needs identified.  ? ? ?Final next level of care: [place] ?Barriers to Discharge: No Barriers Identified ? ? ?Patient Goals and CMS Choice ?Patient states their goals for this hospitalization and ongoing recovery are:: to go home ?CMS Medicare.gov Compare Post Acute Care list provided to:: Patient ?Choice offered to / list presented to : Patient ? ?Discharge Placement ?  ?           ?  ?  ?  ?Patient and family notified of of transfer: 04/17/21 ? ?Discharge Plan and Services ?  ?  ?           ?  ?  ?  ?  ?  ?  ?  ?  ?  ?  ? ?Social Determinants of Health (SDOH) Interventions ?  ? ? ?Readmission Risk Interventions ?   ? View : No data to display.  ?  ?  ?  ? ? ? ? ? ?

## 2021-04-17 NOTE — Care Management Important Message (Signed)
Important Message ? ?Patient Details  ?Name: Tanya Osborne ?MRN: [id number] ?Date of Birth: [birth year] ? ? ?Medicare Important Message Given:  Other (see comment) ? ?Disposition to discharge home with hospice services.  Medicare IM withheld at this time out of respect for patient and family.  ? ? ?[name] ?04/17/2021, 8:44 AM ?

## 2021-04-17 NOTE — Assessment & Plan Note (Signed)
stable °

## 2021-04-17 NOTE — Discharge Summary (Addendum)
?Physician Discharge Summary ?  ?Patient: Tanya Osborne MRN: [id number] DOB: 1960-10-25  ?Admit date:     04/14/2021  ?Discharge date: 04/17/21  ?Discharge Physician: Sharen Hones  ? ?PCP: Annice Needy, MD  ? ?Recommendations at discharge:  ? ?Follow-up with hospice at home ? ?Discharge Diagnoses: ?Principal Problem: ?  CVA (cerebral vascular accident) ([place]) ?Active Problems: ?  UTI (urinary tract infection) ?  Acute respiratory failure with hypoxia ([place]) ?  RA (rheumatoid arthritis) ([place]) ?  CKD (chronic kidney disease) stage 3, GFR 30-59 ml/min (HCC) ?  Chronic kidney disease, stage 3a ([place]) ?  Protein-calorie malnutrition, severe ?  Hypokalemia ?  Bacteremia due to Klebsiella pneumoniae ?  Elevated troponin ?  Hypomagnesemia ?  Primary colon cancer without distant metastasis (M0) ([place]) ? ?Resolved Problems: ?  * No resolved hospital problems. * ? ?Hospital Course: ?Tanya Osborne is a 61 y.o. female with medical history significant for rheumatoid arthritis on prednisone chronically, hypertension, GERD, primary sclerosing cholangitis, stage IIIb chronic kidney disease, autoimmune liver disease, colon cancer status post colectomy and end ileostomy at [facility] in 02/2020, currently not on chemotherapy and recent hospitalization for Klebsiella pneumonia bacteremia and discharged home with hospice on oral antibiotic therapy. ?Patient was placed on IV Rocephin.  MRI of the brain showed acute stroke in right occipital lobe and left frontal lobe.  Consistent with embolic stroke. ?Patient condition is terminal, her life expectancy probably is less than 2 weeks.  After long discussion with the family, patient will go home with hospice care. ? ?Assessment and Plan: ?* CVA (cerebral vascular accident) ([place]) ? MRI scan showed right occipital and left frontal stroke.  Echocardiogram did not show any blood clots. ?After discussion with the family, patient will not be treated.  She is not able to tolerate any p.o. tablets. ? ?UTI  (urinary tract infection) ?Antibiotics completed. ? ?Acute respiratory failure with hypoxia ([place]) ?Patient had hypoxemia probably secondary to acute stroke.  Currently off oxygen.  No short of breath ? ?RA (rheumatoid arthritis) ([place]) ?Continue prednisone. ? ?Primary colon cancer without distant metastasis (M0) ([place]) ?Patient has end-stage colon  cancer with metastasis to the liver as well as peritoneal carcinomatosis.   ?Condition terminal, patient be discharged home with hospice care.  Life expectancy is probably less than 2 weeks based on intake ? ?Hypomagnesemia ?Repleted.   ? ?Elevated troponin ?This is secondary to acute stroke. ? ?Bacteremia due to Klebsiella pneumoniae ?Antibiotics completed. ? ?Hypokalemia ?Received potassium. ? ?Protein-calorie malnutrition, severe ?Patient still has a very poor appetite, very poor prognosis. ? ?Chronic kidney disease, stage 3a ([place]) ?stable ? ?CKD (chronic kidney disease) stage 3, GFR 30-59 ml/min (HCC) ?Stable ? ? ? ?Chronic kidney disease stage 3A. ? ?  ? ? ?Consultants: Neurology ?Procedures performed: None  ?Disposition: Hospice care ?Diet recommendation:  ?Discharge Diet Orders (From admission, onward)  ? ?  Start     Ordered  ? 04/17/21 0000  Diet - low sodium heart healthy       ? 04/17/21 0930  ? ?  ?  ? ?  ? ?Regular diet ?DISCHARGE MEDICATION: ?Allergies as of 04/17/2021   ? ?   Reactions  ? Nsaids Swelling, Rash  ? Other Reaction: OTHER REACTION-GI BLEED  ? Penicillins Swelling  ? Percocet [oxycodone-acetaminophen] Nausea And Vomiting  ? Celebrex [celecoxib] Swelling  ? Cortisone Swelling  ? Gabapentin Swelling  ? Methotrexate Derivatives Swelling  ? Nabumetone Other (See Comments)  ? Tomato Rash  ? Joint inflamation  ? ?  ? ?  ?  Medication List  ?  ? ?STOP taking these medications   ? ?oxyCODONE 5 MG immediate release tablet ?Commonly known as: Oxy IR/ROXICODONE ?  ?pantoprazole 40 MG tablet ?Commonly known as: PROTONIX ?  ? ?  ? ?TAKE these medications    ? ?guaiFENesin-dextromethorphan 100-10 MG/5ML syrup ?Commonly known as: ROBITUSSIN DM ?Take 5 mLs by mouth every 4 (four) hours as needed for cough (chest congestion). ?  ?lidocaine-prilocaine cream ?Commonly known as: EMLA ?Apply 1 application. topically as needed. ?  ?morphine 20 MG/5ML solution ?Take 0.6 mLs (2.4 mg total) by mouth every 2 (two) hours as needed for up to 7 days for pain. ?  ?predniSONE 5 MG tablet ?Commonly known as: DELTASONE ?Take 5 mg by mouth daily. ?  ? ?  ? ? ?Discharge Exam: ?Filed Weights  ? 04/14/21 3818 04/14/21 2000  ?Weight: 61.7 kg 56.6 kg  ? ?General exam: Appears calm and comfortable, severely malnourished with muscle atrophy. ?Respiratory system: Clear to auscultation. Respiratory effort normal. ?Cardiovascular system: S1 & S2 heard, RRR. No JVD, murmurs, rubs, gallops or clicks. No pedal edema. ?Gastrointestinal system: Abdomen is nondistended, soft and nontender. No organomegaly or masses felt. Normal bowel sounds heard. ?Central nervous system: Alert and oriented x2. No focal neurological deficits. ?Extremities: Symmetric 5 x 5 power. ?Skin: No rashes, lesions or ulcers ?Psychiatry: Judgement and insight appear normal. Mood & affect appropriate.  ? ? ?Condition at discharge: poor ? ?The results of significant diagnostics from this hospitalization (including imaging, microbiology, ancillary and laboratory) are listed below for reference.  ? ?Imaging Studies: ?CT ABDOMEN PELVIS WO CONTRAST ? ?Result Date: 04/02/2021 ?CLINICAL DATA:  Sepsis EXAM: CT ABDOMEN AND PELVIS WITHOUT CONTRAST TECHNIQUE: Multidetector CT imaging of the abdomen and pelvis was performed following the standard protocol without IV contrast. RADIATION DOSE REDUCTION: This exam was performed according to the departmental dose-optimization program which includes automated exposure control, adjustment of the mA and/or kV according to patient size and/or use of iterative reconstruction technique. COMPARISON:  CT  pelvis dated October 21, 2020 FINDINGS: Lower chest: Clustered nodules of the right middle lobe. Trace bilateral pleural effusions. Hepatobiliary: No focal liver abnormality is seen. Status post cholecystectomy. No biliary dilatation. Pancreas: Unremarkable. No pancreatic ductal dilatation or surrounding inflammatory changes. Spleen: Normal in size without focal abnormality. Adrenals/Urinary Tract: Bilateral adrenal glands are unremarkable. Mild right hydronephrosis. Bladder is decompressed. Stomach/Bowel: Markedly distended distal esophagus, stomach, and proximal duodenum, and dilated proximal small bowel loops with transition point in the left upper quadrant. Increased omental and peritoneal stranding and nodularity of the left upper quadrant with associated developing fluid collection centered about proximal small bowel loops. Right lower quadrant ileostomy and Hartman's pouch. Vascular/Lymphatic: No significant vascular findings are present. Increased size of subcentimeter retroperitoneal lymph nodes. Reference left periaortic lymph node measuring 7 mm in short axis on series 2, image 40, previously 4 mm. Reproductive: Fibroid uterus. Right adnexal mass measuring 5.5 x 6.4 cm on series 2, image 62, previously measured 3.1 x 3.5 cm. Other: No abdominal wall hernia or abnormality. Musculoskeletal: No acute or significant osseous findings. IMPRESSION: 1. Increased omental and peritoneal stranding and nodularity of the left upper quadrant with new loculated fluid collection and upstream distension of the proximal small bowel, stomach and esophagus. Findings likely represent small bowel obstruction secondary to worsening peritoneal carcinomatosis. Given infectious symptoms, superinfection of fluid collection is a concern, consider percutaneous sampling. 2. New mild right hydronephrosis likely secondary to obstruction from right adnexal mass, pyelonephritis or pyonephrosis  is a consideration given infectious symptoms.  3. Increased size of right adnexal mass, likely peritoneal implant related to colon cancer. 4. Increased size of subcentimeter retroperitoneal lymph nodes, potentially reactive, although metastatic disease is

## 2021-04-17 NOTE — Progress Notes (Signed)
Manufacturing engineer [facility]) Hospitalized Hospice Patient Visit [facility] [phone]) ? ?Per MD, plan is for patient to discharge home today, address is correct in the chart. Family had several questions regarding Hospice Home process and education provided. [facility] staff to F/U with patient once she returns home.Patient to transport via private vehicle per family request. ? ?Please send signed and completed DNR with patient/family upon discharge. Please provide prescriptions at discharge as needed to ensure ongoing symptom management and a transport packet. ?  ?[facility] information and contact numbers given to family and above information shared with TOC & MD.  ?  ?Please call with any questions/concerns.  ?  ?Thank you for the opportunity to participate in this patient's care ?  ?Daphene Calamity, MSW ?[place]  ?[phone] ? ?

## 2021-04-19 LAB — CULTURE, BLOOD (ROUTINE X 2)
Culture: NO GROWTH
Culture: NO GROWTH
Special Requests: ADEQUATE
Special Requests: ADEQUATE

## 2021-05-11 DEATH — deceased

## 2023-10-21 IMAGING — MR MR HEAD WO/W CM
10 of 13 series · 32 of 48 positions shown · IV contrast (6ml Gadavist)
Comparison: CT head 04/14/2021

CLINICAL DATA: Acute neuro deficit.  Colon cancer

EXAM:
MRI HEAD WITHOUT AND WITH CONTRAST
MRA HEAD WITHOUT CONTRAST
MRA NECK WITHOUT AND WITH CONTRAST
TECHNIQUE: Multiplanar, multiecho pulse sequences of the brain and surrounding
structures were obtained without and with intravenous contrast.
Angiographic images of the Circle of Willis were obtained using MRA
technique without intravenous contrast. Angiographic images of the
neck were obtained using MRA technique without and with intravenous
contrast. Carotid stenosis measurements (when applicable) are
obtained utilizing NASCET criteria, using the distal internal
carotid diameter as the denominator.
CONTRAST:  6mL GADAVIST GADOBUTROL 1 MMOL/ML IV SOLN

[Series 5: ax dwi_tracew · axial · 3.0mm · 0.71mm/px · z∈[-77,+87]mm · 7 of 56 slices shown]
[im 1/56]
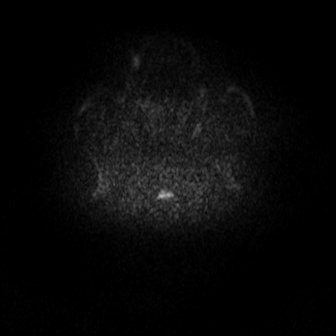
[im 10/56]
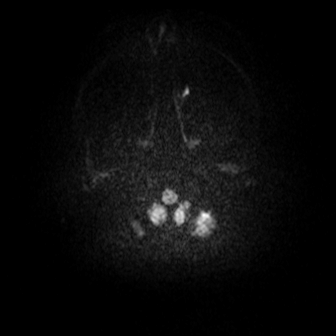
[im 19/56]
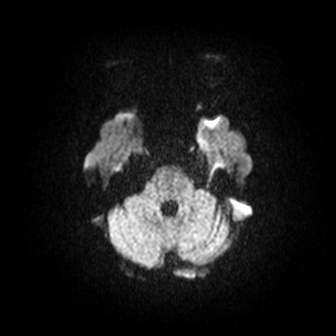
[im 28/56]
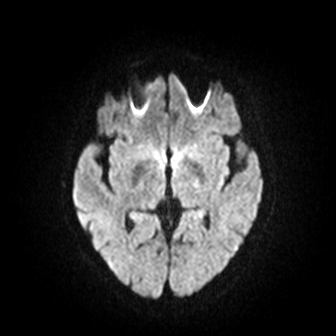
[im 37/56]
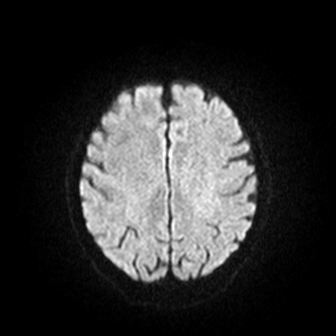
[im 46/56]
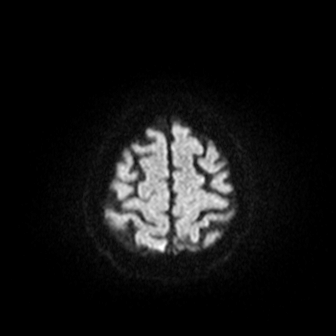
[im 56/56]
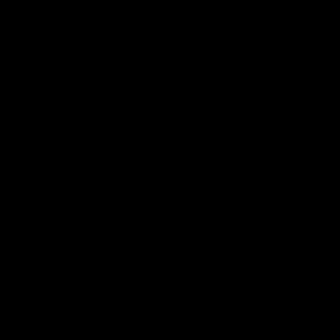

[Series 6: ax dwi_adc · axial · 3.0mm · 0.71mm/px · 1 of 55 slices shown]
[im 1/55]
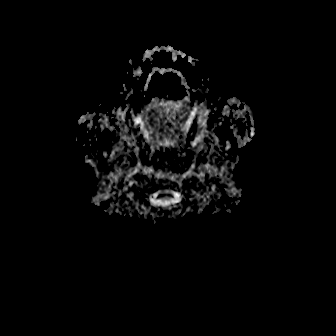

[Series 9: T2 · axial · 5.0mm · 0.45mm/px · z∈[-62,+81]mm · 3 of 25 slices shown (1 of 2)]
[im 1/25]
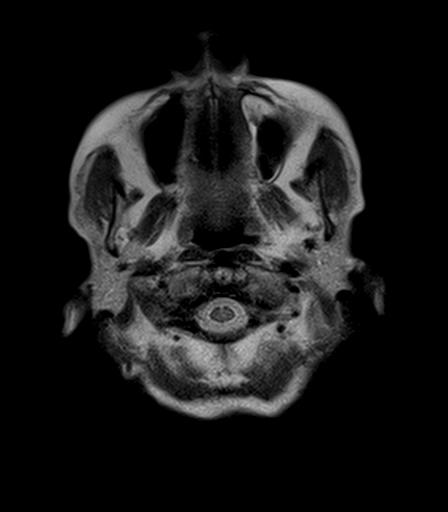
[im 13/25]
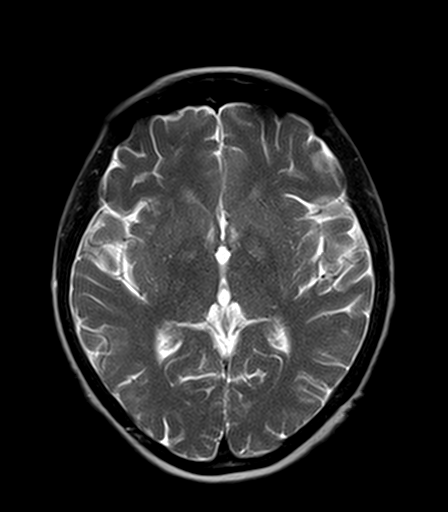
[im 25/25]
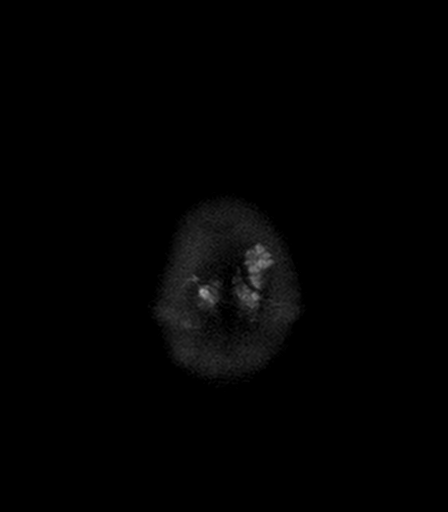

[Series 11: FLAIR · axial · 3.0mm · 0.69mm/px · z∈[-63,+80]mm · 5 of 49 slices shown]
[im 1/49]
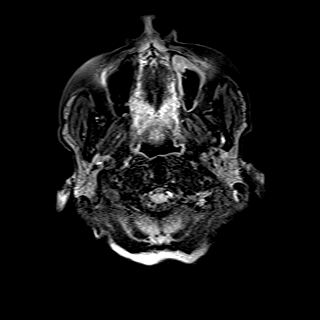
[im 13/49]
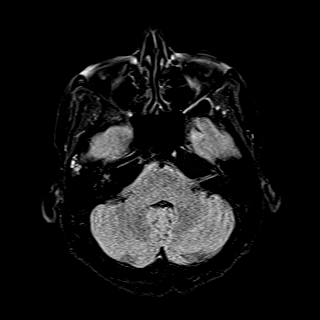
[im 25/49]
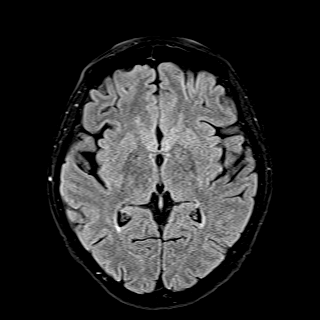
[im 37/49]
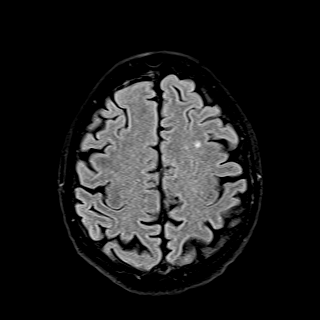
[im 49/49]
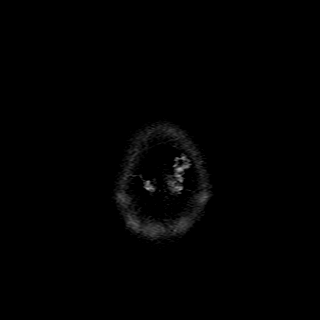

[Series 12: T1 · axial · 5.0mm · 0.90mm/px · z∈[-62,+81]mm · 3 of 25 slices shown (1 of 2)]
[im 1/25]
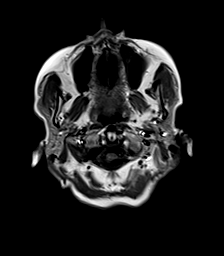
[im 13/25]
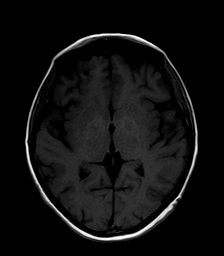
[im 25/25]
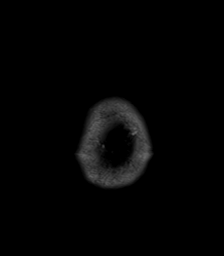

[Series 13: T1 · sagittal · 5.0mm · 0.47mm/px · 2 of 22 slices shown (2 of 2)]
[im 1/22]
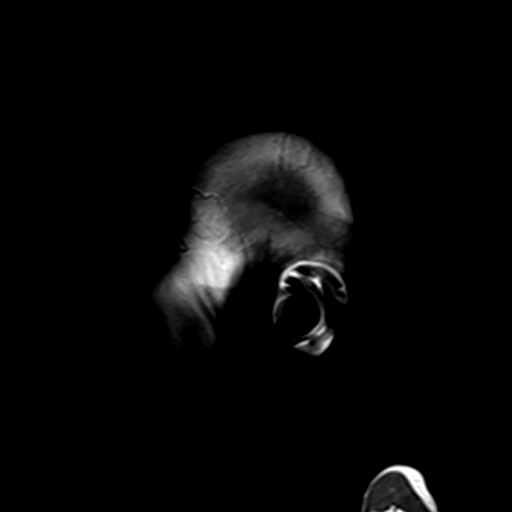
[im 22/22]
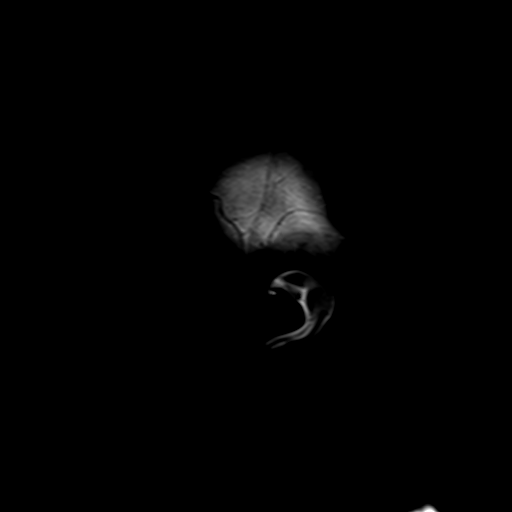

[Series 14: T2 · coronal · 5.0mm · 0.45mm/px · 3 of 28 slices shown (2 of 2)]
[im 1/28]
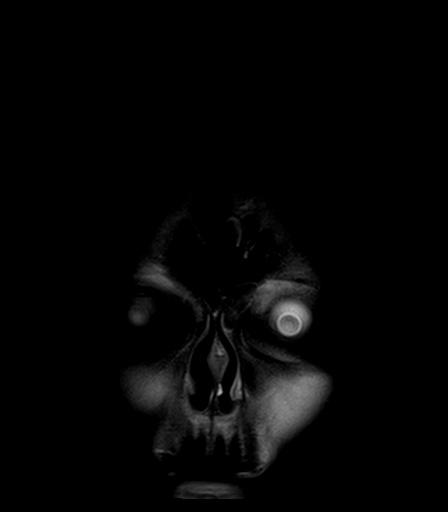
[im 14/28]
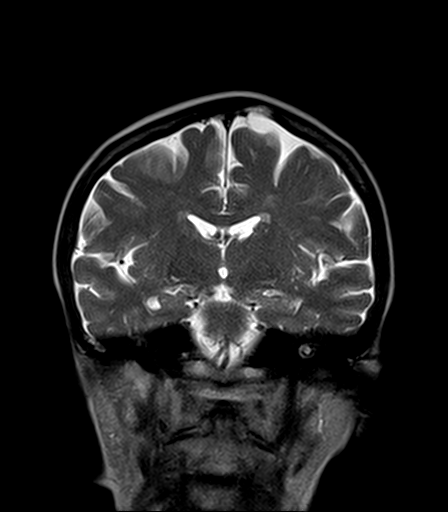
[im 28/28]
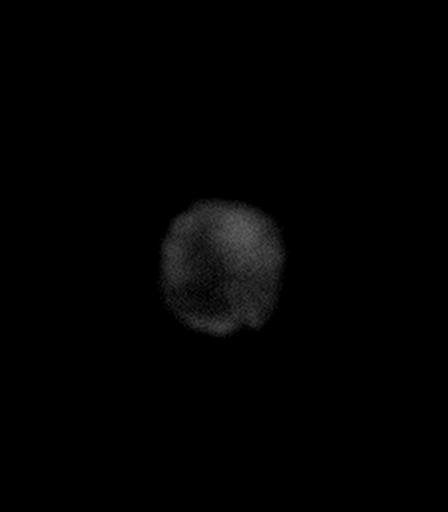

[Series 15: T1 post-contrast · axial · 5.0mm · 0.90mm/px · z∈[-62,+81]mm · 3 of 25 slices shown (1 of 3)]
[im 1/25]
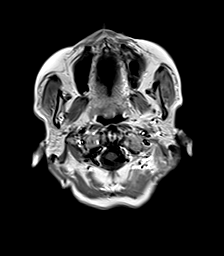
[im 13/25]
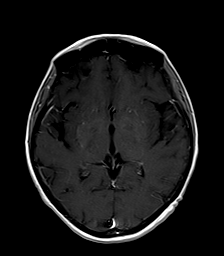
[im 25/25]
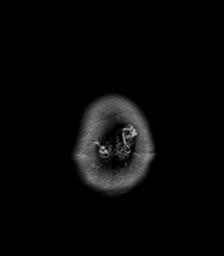

[Series 16: T1 post-contrast · coronal · 5.0mm · 0.43mm/px · 3 of 28 slices shown (2 of 3)]
[im 1/28]
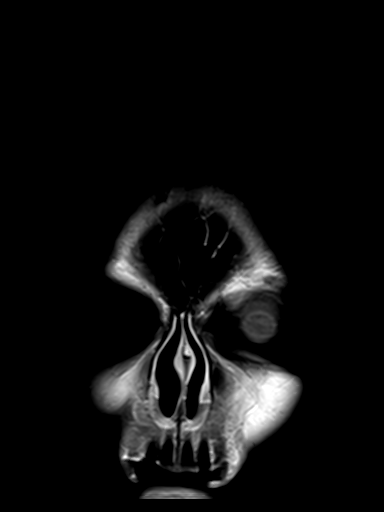
[im 14/28]
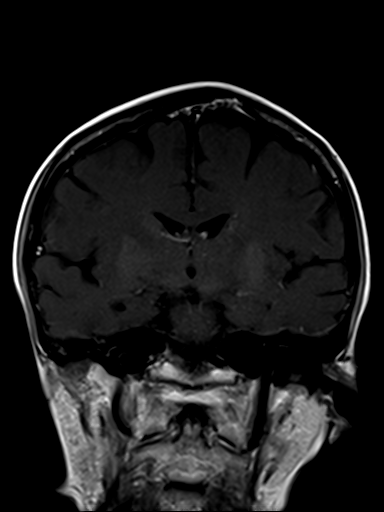
[im 28/28]
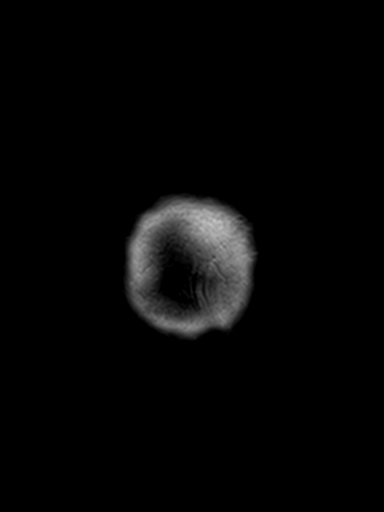

[Series 17: T1 post-contrast · sagittal · 5.0mm · 0.47mm/px · 2 of 22 slices shown (3 of 3)]
[im 1/22]
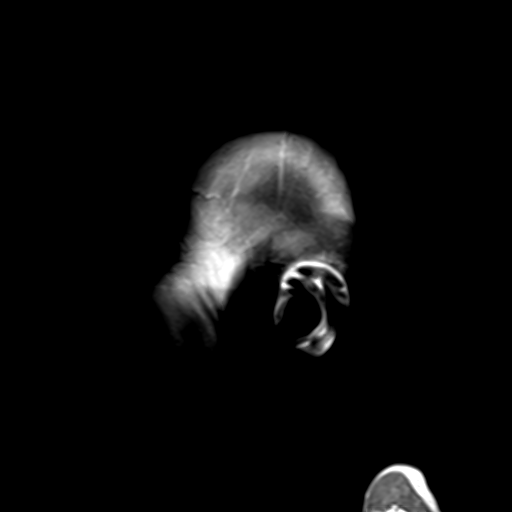
[im 22/22]
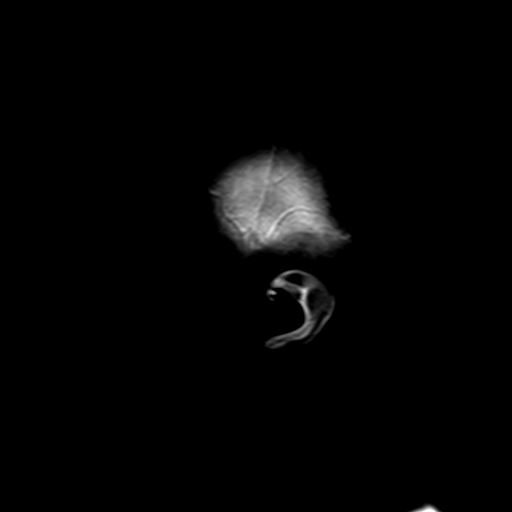

[32 of 48 positions shown; findings below may reference images not displayed]

FINDINGS: MRI HEAD FINDINGS

Brain: Small areas of acute infarct in the right medial occipital
cortex, right parietal white matter, and left frontal parietal white
matter.

Mild chronic microvascular ischemic changes in the white matter.
Negative for hemorrhage or mass. No enhancing lesion following
contrast infusion.

Vascular: Normal arterial flow voids

Skull and upper cervical spine: No focal skeletal lesion.

Sinuses/Orbits: Negative

Other: None

MRA HEAD FINDINGS

Atherosclerotic irregularity right cavernous carotid with mild to
moderate stenosis. Left carotid patent. Anterior middle cerebral
arteries patent. Hypoplastic right A1 segment. No aneurysm.

Both vertebral arteries patent to the basilar. Basilar widely
patent. PICA not visualized. AICA patent bilaterally. Superior
cerebellar and posterior cerebral arteries patent bilaterally. Left
posterior communicating artery patent. No aneurysm.

MRA NECK FINDINGS

Normal aortic arch.  Proximal great vessels widely patent.

Common carotid artery widely patent. Internal carotid artery widely
patent. Mild to moderate stenosis proximal left external carotid
artery.

Both vertebral arteries are patent without stenosis or irregularity.
IMPRESSION: 1. Small areas of acute infarct in the right occipital cortex, right
parietal white matter, and left frontal parietal white matter.
Findings suggest small emboli. In addition there is mild to moderate
chronic microvascular ischemic change in the white matter
2. No intracranial large vessel occlusion or significant stenosis.
Mild to moderate stenosis right cavernous carotid
3. Internal carotid and vertebral arteries are widely patent in the
neck. Mild to moderate stenosis proximal left external carotid
artery.

## 2023-10-21 IMAGING — MR MR MRA NECK WO/W CM
4 of 5 series · 34 of 48 positions shown · IV contrast (6ml Gadavist)
Comparison: CT head 04/14/2021

CLINICAL DATA: Acute neuro deficit.  Colon cancer

EXAM:
MRI HEAD WITHOUT AND WITH CONTRAST
MRA HEAD WITHOUT CONTRAST
MRA NECK WITHOUT AND WITH CONTRAST
TECHNIQUE: Multiplanar, multiecho pulse sequences of the brain and surrounding
structures were obtained without and with intravenous contrast.
Angiographic images of the Circle of Willis were obtained using MRA
technique without intravenous contrast. Angiographic images of the
neck were obtained using MRA technique without and with intravenous
contrast. Carotid stenosis measurements (when applicable) are
obtained utilizing NASCET criteria, using the distal internal
carotid diameter as the denominator.
CONTRAST:  6mL GADAVIST GADOBUTROL 1 MMOL/ML IV SOLN

[Series 9: angio_fl3d_cor_pre_ttc=2.0s · coronal · 0.9mm · 0.85mm/px · 11 of 96 slices shown]
[im 1/96]
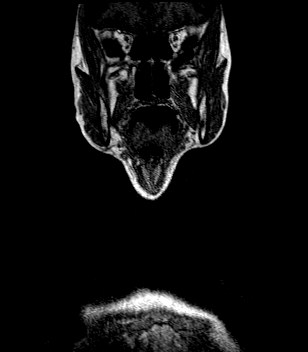
[im 10/96]
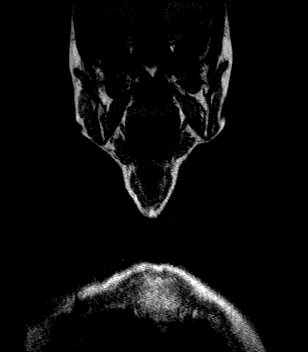
[im 20/96]
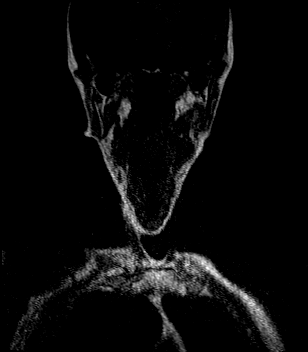
[im 29/96]
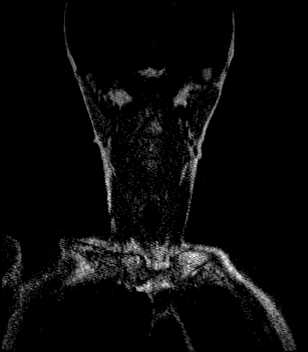
[im 39/96]
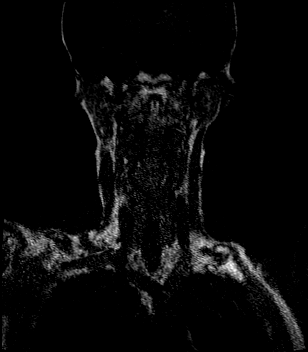
[im 48/96]
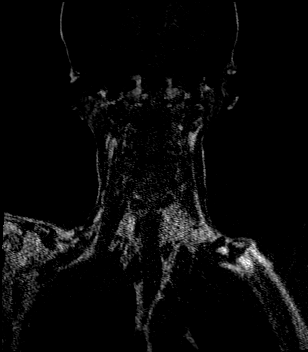
[im 58/96]
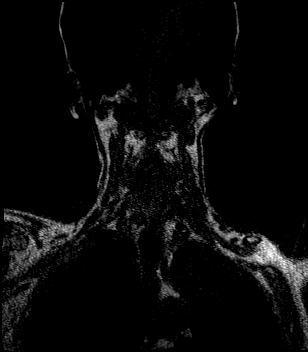
[im 67/96]
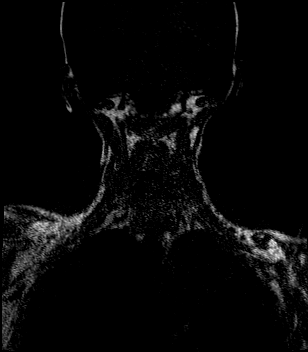
[im 77/96]
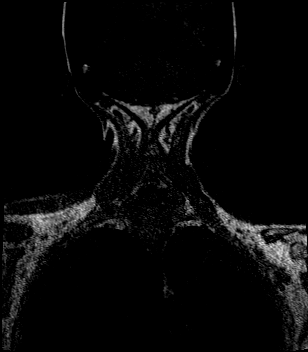
[im 86/96]
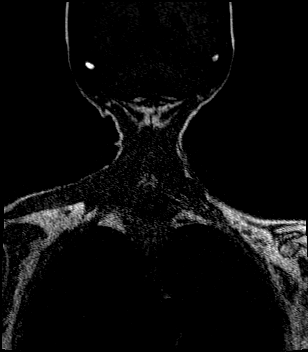
[im 96/96]
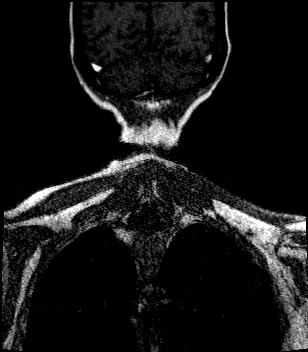

[Series 12: angio_fl3d_cor_post_ttc=2.0s_moco-adv · coronal · 0.9mm · 0.85mm/px · 11 of 96 slices shown]
[im 1/96]
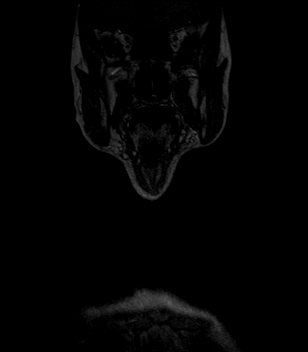
[im 10/96]
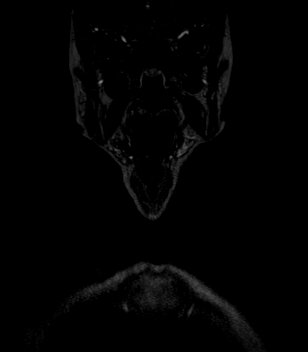
[im 20/96]
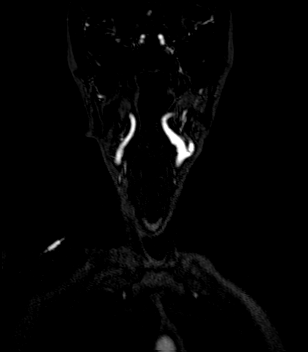
[im 29/96]
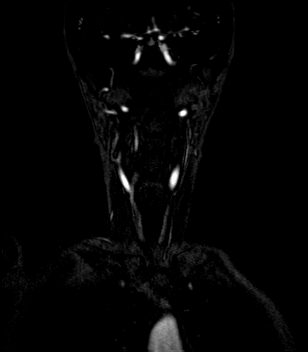
[im 39/96]
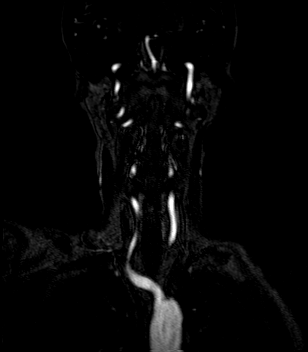
[im 48/96]
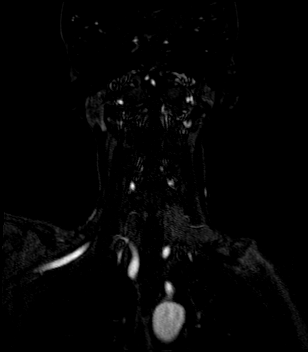
[im 58/96]
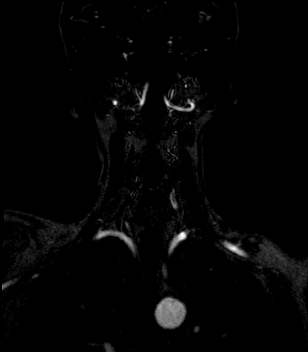
[im 67/96]
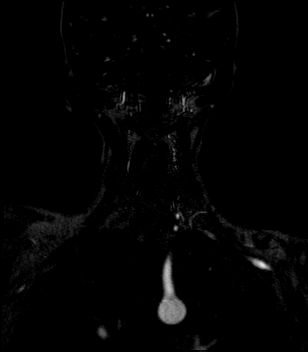
[im 77/96]
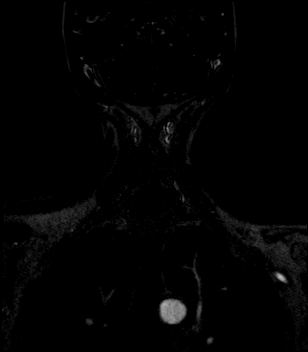
[im 86/96]
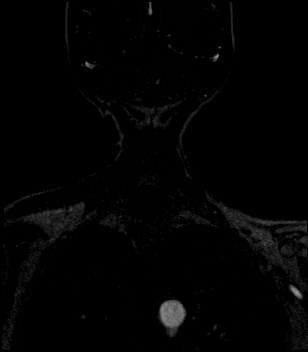
[im 96/96]
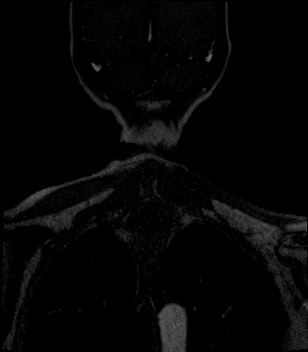

[Series 13: angio_fl3d_cor_post_ttc=2.0s_moco-adv_sub · coronal · 0.9mm · 0.85mm/px · 11 of 95 slices shown]
[im 1/95]
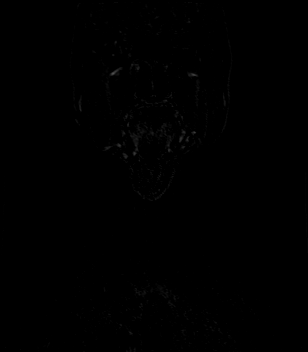
[im 10/95]
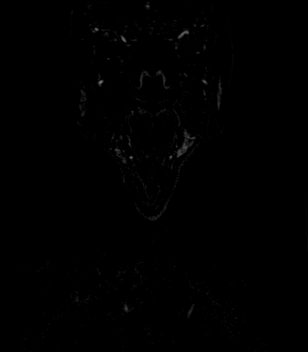
[im 19/95]
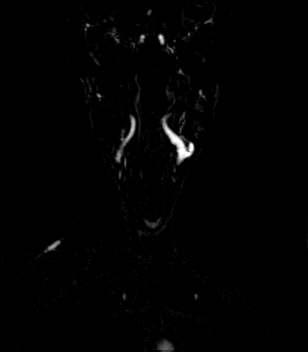
[im 29/95]
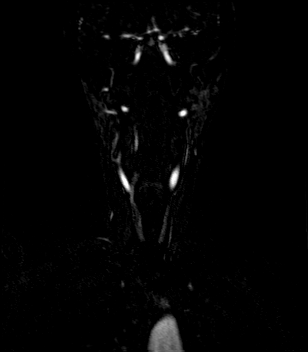
[im 38/95]
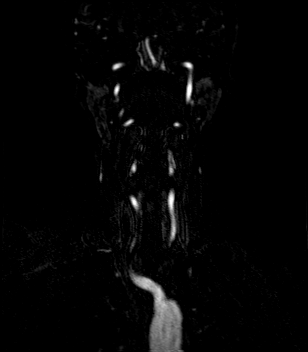
[im 48/95]
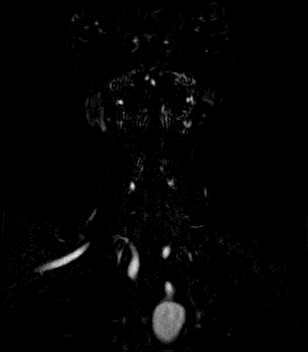
[im 57/95]
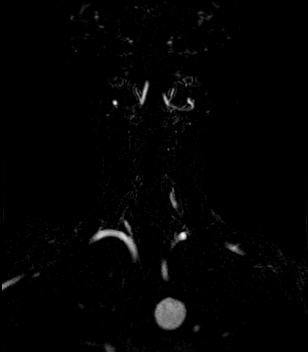
[im 66/95]
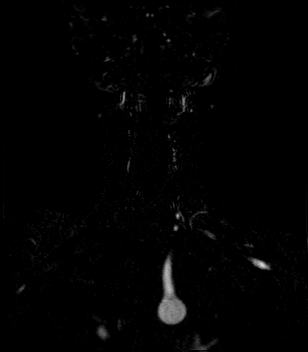
[im 76/95]
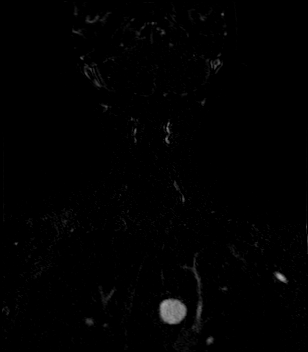
[im 85/95]
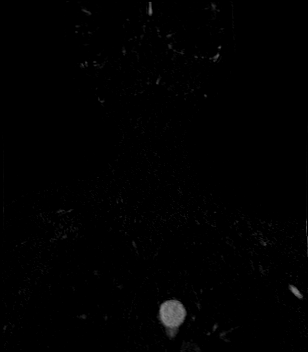
[im 95/95]
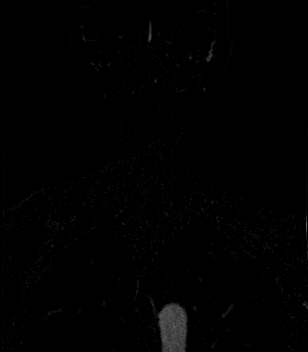

[Series 1054: verts · axial · 406.5mm · 0.65mm/px · 1 of 1 slices shown]
[im 1/1]
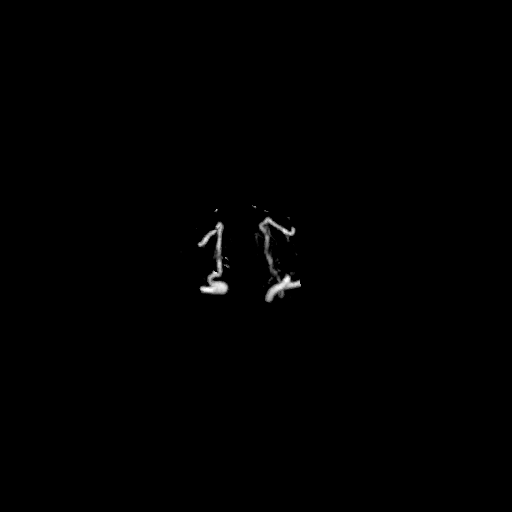

[34 of 48 positions shown; findings below may reference images not displayed]

FINDINGS: MRI HEAD FINDINGS

Brain: Small areas of acute infarct in the right medial occipital
cortex, right parietal white matter, and left frontal parietal white
matter.

Mild chronic microvascular ischemic changes in the white matter.
Negative for hemorrhage or mass. No enhancing lesion following
contrast infusion.

Vascular: Normal arterial flow voids

Skull and upper cervical spine: No focal skeletal lesion.

Sinuses/Orbits: Negative

Other: None

MRA HEAD FINDINGS

Atherosclerotic irregularity right cavernous carotid with mild to
moderate stenosis. Left carotid patent. Anterior middle cerebral
arteries patent. Hypoplastic right A1 segment. No aneurysm.

Both vertebral arteries patent to the basilar. Basilar widely
patent. PICA not visualized. AICA patent bilaterally. Superior
cerebellar and posterior cerebral arteries patent bilaterally. Left
posterior communicating artery patent. No aneurysm.

MRA NECK FINDINGS

Normal aortic arch.  Proximal great vessels widely patent.

Common carotid artery widely patent. Internal carotid artery widely
patent. Mild to moderate stenosis proximal left external carotid
artery.

Both vertebral arteries are patent without stenosis or irregularity.
IMPRESSION: 1. Small areas of acute infarct in the right occipital cortex, right
parietal white matter, and left frontal parietal white matter.
Findings suggest small emboli. In addition there is mild to moderate
chronic microvascular ischemic change in the white matter
2. No intracranial large vessel occlusion or significant stenosis.
Mild to moderate stenosis right cavernous carotid
3. Internal carotid and vertebral arteries are widely patent in the
neck. Mild to moderate stenosis proximal left external carotid
artery.
